# Patient Record
Sex: Male | Born: 1950 | Race: Black or African American | Hispanic: No | Marital: Single | Smoking: Current some day smoker
Health system: Southern US, Community
[De-identification: ages and names within clinical notes are randomized; demographics above are authoritative.]

## PROBLEM LIST (undated history)

## (undated) DIAGNOSIS — I10 Essential (primary) hypertension: Secondary | ICD-10-CM

## (undated) DIAGNOSIS — K219 Gastro-esophageal reflux disease without esophagitis: Secondary | ICD-10-CM

## (undated) DIAGNOSIS — F319 Bipolar disorder, unspecified: Secondary | ICD-10-CM

## (undated) DIAGNOSIS — I219 Acute myocardial infarction, unspecified: Secondary | ICD-10-CM

## (undated) HISTORY — DX: Acute myocardial infarction, unspecified: I21.9

## (undated) HISTORY — PX: LEG SURGERY: SHX1003

## (undated) HISTORY — PX: KNEE SURGERY: SHX244

---

## 2016-01-11 ENCOUNTER — Emergency Department
Admission: EM | Admit: 2016-01-11 | Discharge: 2016-01-11 | Disposition: A | Discharge status: Home / Self Care | Payer: Medicare Other | Attending: Emergency Medicine | Admitting: Emergency Medicine

## 2016-01-11 ENCOUNTER — Emergency Department: Payer: Medicare Other

## 2016-01-11 ENCOUNTER — Encounter: Payer: Self-pay | Admitting: Emergency Medicine

## 2016-01-11 DIAGNOSIS — W010XXA Fall on same level from slipping, tripping and stumbling without subsequent striking against object, initial encounter: Secondary | ICD-10-CM | POA: Insufficient documentation

## 2016-01-11 DIAGNOSIS — S8012XA Contusion of left lower leg, initial encounter: Secondary | ICD-10-CM | POA: Diagnosis not present

## 2016-01-11 DIAGNOSIS — Y999 Unspecified external cause status: Secondary | ICD-10-CM | POA: Diagnosis not present

## 2016-01-11 DIAGNOSIS — Y929 Unspecified place or not applicable: Secondary | ICD-10-CM | POA: Insufficient documentation

## 2016-01-11 DIAGNOSIS — Y939 Activity, unspecified: Secondary | ICD-10-CM | POA: Insufficient documentation

## 2016-01-11 DIAGNOSIS — S8992XA Unspecified injury of left lower leg, initial encounter: Secondary | ICD-10-CM | POA: Diagnosis present

## 2016-01-11 LAB — COMPREHENSIVE METABOLIC PANEL
ALBUMIN: 4.9 g/dL (ref 3.5–5.0)
ALK PHOS: 106 U/L (ref 38–126)
ALT: 14 U/L — ABNORMAL LOW (ref 17–63)
ANION GAP: 9 (ref 5–15)
AST: 21 U/L (ref 15–41)
BUN: 14 mg/dL (ref 6–20)
CALCIUM: 9.5 mg/dL (ref 8.9–10.3)
CHLORIDE: 104 mmol/L (ref 101–111)
CO2: 22 mmol/L (ref 22–32)
Creatinine, Ser: 0.93 mg/dL (ref 0.61–1.24)
GFR calc non Af Amer: >60 mL/min (ref >60)
GLUCOSE: 78 mg/dL (ref 65–99)
POTASSIUM: 4.3 mmol/L (ref 3.5–5.1)
Sodium: 135 mmol/L (ref 135–145)
Total Bilirubin: 1.2 mg/dL (ref 0.3–1.2)
Total Protein: 8.2 g/dL — ABNORMAL HIGH (ref 6.5–8.1)

## 2016-01-11 LAB — CBC WITH DIFFERENTIAL/PLATELET
BASOS PCT: 1 %
Basophils Absolute: 0 10*3/uL (ref 0–0.1)
Eosinophils Absolute: 0.1 10*3/uL (ref 0–0.7)
Eosinophils Relative: 2 %
HEMATOCRIT: 36.3 % — AB (ref 40.0–52.0)
HEMOGLOBIN: 12 g/dL — AB (ref 13.0–18.0)
LYMPHS PCT: 41 %
Lymphs Abs: 2 10*3/uL (ref 1.0–3.6)
MCH: 28.4 pg (ref 26.0–34.0)
MCHC: 33 g/dL (ref 32.0–36.0)
MCV: 86 fL (ref 80.0–100.0)
MONO ABS: 0.3 10*3/uL (ref 0.2–1.0)
MONOS PCT: 7 %
NEUTROS ABS: 2.4 10*3/uL (ref 1.4–6.5)
NEUTROS PCT: 49 %
Platelets: 249 10*3/uL (ref 150–440)
RBC: 4.22 MIL/uL — ABNORMAL LOW (ref 4.40–5.90)
RDW: 13.9 % (ref 11.5–14.5)
WBC: 4.8 10*3/uL (ref 3.8–10.6)

## 2016-01-11 MED ORDER — BACITRACIN-NEOMYCIN-POLYMYXIN 400-5-5000 EX OINT: 1.0000 "application " | TOPICAL_OINTMENT | Freq: Two times a day (BID) | CUTANEOUS | Status: AC

## 2016-01-11 NOTE — ED Notes (Signed)
Unable to p
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
## 2016-01-11 NOTE — ED Notes (Signed)
Patient transported to Ultrasound 

## 2016-01-11 NOTE — ED Provider Notes (Signed)
[facility] Emergency Department Provider Note  ____________________________________________  Time seen: Approximately 12:44 PM  I have reviewed the triage vital signs and the nursing notes.   HISTORY  Chief Complaint Leg Pain    HPI Jaime Zuniga is a 65 y.o. male who lives in a group home presents with complaints of left leg swelling and pain.  Patient states that he tripped and fell approximately 2 weeks ago injuring his left lower leg. Complains his leg is now swollen and spontaneously draining from the open sores. Patient reports fever and chills subjectively.Patient reports just completed a course of amoxicillin secondary to dental infections. Reports eating or drinking last evening at 6 PM last night which was approximately 18 hours ago.   No past medical history on file.  There are no active problems to display for this patient.   No past surgical history on file.  Current Outpatient Rx  Name  Route  Sig  Dispense  Refill  . neomycin-bacitracin-polymyxin (NEOSPORIN) ointment   Topical   Apply 1 application topically 2 (two) times daily.   15 g   0     Allergies Review of patient's allergies indicates no known allergies.  No family history on file.  Social History Social History  Substance Use Topics  . Smoking status: Never Smoker   . Smokeless tobacco: None  . Alcohol Use: None    Review of Systems Constitutional: No fever/chills Eyes: No visual changes. ENT: No sore throat. Cardiovascular: Denies chest pain. Respiratory: Denies shortness of breath. Musculoskeletal: Positive for left leg pain swelling and draining. Skin: Negative for rash. Neurological: Negative for headaches, focal weakness or numbness.  10-point ROS otherwise negative.  ____________________________________________   PHYSICAL EXAM:  VITAL SIGNS: ED Triage Vitals  Enc Vitals Group     BP 01/11/16 1134 122/85 mmHg     Pulse Rate 01/11/16 1134 65      Resp 01/11/16 1134 14     Temp 01/11/16 1134 98.5 F (36.9 C)     Temp Source 01/11/16 1134 Oral     SpO2 01/11/16 1134 99 %     Weight 01/11/16 1134 200 lb (90.719 kg)     Height 01/11/16 1134 6\' 2"  (1.88 m)     Head Cir --      Peak Flow --      Pain Score 01/11/16 1132 5     Pain Loc --      Pain Edu? --      Excl. in GC? --     Constitutional: Alert and oriented. Well appearing and in no acute distress. Mouth/Throat: Mucous membranes are moist.  Oropharynx non-erythematous. Neck: No stridor.   Cardiovascular: Normal rate, regular rhythm. Grossly normal heart sounds.  Good peripheral circulation. Respiratory: Normal respiratory effort.  No retractions. Lungs CTAB. Musculoskeletal: Lower extremity left leg. Edematous, ecchymotic with open lesions noted anteriorly. 2-3+ pitting edema noted. Neurologic:  Normal speech and language. No gross focal neurologic deficits are appreciated. No gait instability. Skin:  Skin is warm, dry and intact. No rash noted. Psychiatric: Mood and affect are normal. Speech and behavior are normal.  ____________________________________________   LABS (all labs ordered are listed, but only abnormal results are displayed)  Labs Reviewed  COMPREHENSIVE METABOLIC PANEL - Abnormal; Notable for the following:    Total Protein 8.2 (*)    ALT 14 (*)    All other components within normal limits  CBC WITH DIFFERENTIAL/PLATELET - Abnormal; Notable for the following:  RBC 4.22 (*)    Hemoglobin 12.0 (*)    HCT 36.3 (*)    All other components within normal limits  CULTURE, BLOOD (ROUTINE X 2)  CULTURE, BLOOD (ROUTINE X 2)  CBC WITH DIFFERENTIAL/PLATELET   ____________________________________________  EKG   ____________________________________________  RADIOLOGY  Ultrasound no acute findings. X-ray no acute ____________________________________________   PROCEDURES  Procedure(s) performed: None  Critical Care performed:  No  ____________________________________________   INITIAL IMPRESSION / ASSESSMENT AND PLAN / ED COURSE  Pertinent labs & imaging results that were available during my care of the patient were reviewed by me and considered in my medical decision making (see chart for details).  To left lower leg with secondary superficial infection. Concern for abscess to the left lower leg. Consult placed for surgeon on-call to evaluate.After surgical consultation patient was discharged home with Neosporin for wound care. Follow up with PCP or return to the ER with any worse ____________________________________________   FINAL CLINICAL IMPRESSION(S) / ED DIAGNOSES  Final diagnoses:  Contusion of leg, left, initial encounter     This chart was dictated using voice recognition software/Dragon. Despite best efforts to proofread, errors can occur which can change the meaning. Any change was purely unintentional.   Evangeline Dakin, PA-C 01/11/16 1726  Jeanmarie Plant, MD 01/12/16 2204

## 2016-01-11 NOTE — Discharge Instructions (Signed)

## 2016-01-11 NOTE — ED Notes (Signed)
Reports tripped and fell a couple of weeks ago, pain to left leg

## 2016-01-16 NOTE — Progress Notes (Cosign Needed)
Previous note written in error on wrong patient.

## 2016-01-16 NOTE — Progress Notes (Signed)
Labs never acknowledged or drawn after 1 hour.

## 2016-01-19 LAB — CULTURE, BLOOD (ROUTINE X 2): Culture: NO GROWTH

## 2016-02-08 ENCOUNTER — Encounter: Payer: Self-pay | Admitting: Emergency Medicine

## 2016-02-08 ENCOUNTER — Emergency Department
Admission: EM | Admit: 2016-02-08 | Discharge: 2016-02-08 | Disposition: A | Discharge status: Home / Self Care | Payer: Medicare Other | Attending: Emergency Medicine | Admitting: Emergency Medicine

## 2016-02-08 ENCOUNTER — Emergency Department: Payer: Medicare Other

## 2016-02-08 DIAGNOSIS — I10 Essential (primary) hypertension: Secondary | ICD-10-CM | POA: Insufficient documentation

## 2016-02-08 DIAGNOSIS — L97929 Non-pressure chronic ulcer of unspecified part of left lower leg with unspecified severity: Secondary | ICD-10-CM | POA: Diagnosis not present

## 2016-02-08 DIAGNOSIS — F319 Bipolar disorder, unspecified: Secondary | ICD-10-CM | POA: Insufficient documentation

## 2016-02-08 DIAGNOSIS — M79662 Pain in left lower leg: Secondary | ICD-10-CM | POA: Diagnosis present

## 2016-02-08 DIAGNOSIS — L98499 Non-pressure chronic ulcer of skin of other sites with unspecified severity: Secondary | ICD-10-CM

## 2016-02-08 HISTORY — DX: Bipolar disorder, unspecified: F31.9

## 2016-02-08 HISTORY — DX: Essential (primary) hypertension: I10

## 2016-02-08 HISTORY — DX: Gastro-esophageal reflux disease without esophagitis: K21.9

## 2016-02-08 NOTE — Discharge Instructions (Signed)
Skin Ulcer  A skin ulcer is an open sore that can be shallow or deep. Skin ulcers sometimes become infected and are difficult to treat. It may be 1 month or longer before real healing progress is made.  CAUSES   · Injury.  · Problems with the veins or arteries.  · Diabetes.  · Insect bites.  · Bedsores.  · Inflammatory conditions.  SYMPTOMS   · Pain, redness, swelling, and tenderness around the ulcer.  · Fever.  · Bleeding from the ulcer.  · Yellow or clear fluid coming from the ulcer.  DIAGNOSIS   There are many types of skin ulcers. Any open sores will be examined. Certain tests will be done to determine the kind of ulcer you have. The right treatment depends on the type of ulcer you have.  TREATMENT   Treatment is a long-term challenge. It may include:  · Wearing an elastic wrap, compression stockings, or gel cast over the ulcer area.  · Taking antibiotic medicines or putting antibiotic creams on the affected area if there is an infection.  HOME CARE INSTRUCTIONS  · Put on your bandages (dressings), wraps, or casts over the ulcer as directed by your caregiver.  · Change all dressings as directed by your caregiver.  · Take all medicines as directed by your caregiver.  · Keep the affected area clean and dry.  · Avoid injuries to the affected area.  · Eat a well-balanced, healthy diet that includes plenty of fruit and vegetables.  · If you smoke, consider quitting or decreasing the amount of cigarettes you smoke.  · Once the ulcer heals, get regular exercise as directed by your caregiver.  · Work with your caregiver to make sure your blood pressure, cholesterol, and diabetes are well-controlled.  · Keep your skin moisturized. Dry skin can crack and lead to skin ulcers.  SEEK IMMEDIATE MEDICAL CARE IF:   · Your pain gets worse.  · You have swelling, redness, or fluids around the ulcer.  · You have chills.  · You have a fever.  MAKE SURE YOU:   · Understand these instructions.  · Will watch your condition.  · Will get  help right away if you are not doing well or get worse.     This information is not intended to replace advice given to you by your health care provider. Make sure you discuss any questions you have with your health care provider.     Document Released: 08/31/2004 Document Revised: 10/16/2011 Document Reviewed: 03/10/2011  Elsevier Interactive Patient Education ©2016 [employer]

## 2016-02-08 NOTE — ED Notes (Signed)
Pt alert and oriented X4, active, cooperative, pt in NAD. RR even and unlabored, color WNL.  Pt informed to return if any life threatening symptoms occur.  Pt left with caregiver from facility who signed e signature for followup and has discharge papers.

## 2016-02-08 NOTE — ED Notes (Signed)
X-ray at bedside

## 2016-02-08 NOTE — ED Notes (Signed)
[facility] does not carry [employer] boot anymore per staff. Brought up "econo-paste" bandage for wound. Dr. Mayford KnifeWilliams informed and gives verbal orders to apple to mid shin area of left leg to cover wound. Wille CelesteJanie, RN at bedside to assist. Pt CMS intact after application, able to move toes, color WNL. Ace bandage placed on top per MD order

## 2016-02-08 NOTE — ED Notes (Signed)
[facility] called for [employer]na boot.

## 2016-02-08 NOTE — ED Notes (Signed)
MD at bedside. 
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
## 2016-02-08 NOTE — ED Notes (Signed)
Pt with wound to left anterior shin.  Pt states home health nurse checked his wound last night and dressed it, but now has increased drainage/swelling

## 2016-02-11 ENCOUNTER — Encounter: Payer: Medicare Other | Attending: Surgery | Admitting: Surgery

## 2016-02-11 ENCOUNTER — Other Ambulatory Visit: Payer: Self-pay | Admitting: Surgery

## 2016-02-11 DIAGNOSIS — F0390 Unspecified dementia without behavioral disturbance: Secondary | ICD-10-CM | POA: Insufficient documentation

## 2016-02-11 DIAGNOSIS — I252 Old myocardial infarction: Secondary | ICD-10-CM | POA: Insufficient documentation

## 2016-02-11 DIAGNOSIS — I10 Essential (primary) hypertension: Secondary | ICD-10-CM | POA: Insufficient documentation

## 2016-02-11 DIAGNOSIS — K219 Gastro-esophageal reflux disease without esophagitis: Secondary | ICD-10-CM | POA: Diagnosis not present

## 2016-02-11 DIAGNOSIS — R32 Unspecified urinary incontinence: Secondary | ICD-10-CM | POA: Diagnosis not present

## 2016-02-11 DIAGNOSIS — M61462 Other calcification of muscle, left lower leg: Secondary | ICD-10-CM | POA: Diagnosis not present

## 2016-02-11 DIAGNOSIS — L97222 Non-pressure chronic ulcer of left calf with fat layer exposed: Secondary | ICD-10-CM | POA: Insufficient documentation

## 2016-02-11 DIAGNOSIS — L97909 Non-pressure chronic ulcer of unspecified part of unspecified lower leg with unspecified severity: Secondary | ICD-10-CM

## 2016-02-11 DIAGNOSIS — M1990 Unspecified osteoarthritis, unspecified site: Secondary | ICD-10-CM | POA: Diagnosis not present

## 2016-02-11 DIAGNOSIS — I739 Peripheral vascular disease, unspecified: Secondary | ICD-10-CM | POA: Diagnosis not present

## 2016-02-11 DIAGNOSIS — Z87891 Personal history of nicotine dependence: Secondary | ICD-10-CM | POA: Insufficient documentation

## 2016-02-11 DIAGNOSIS — Z79899 Other long term (current) drug therapy: Secondary | ICD-10-CM | POA: Insufficient documentation

## 2016-02-11 DIAGNOSIS — G20 Parkinson's disease: Secondary | ICD-10-CM | POA: Insufficient documentation

## 2016-02-11 DIAGNOSIS — J449 Chronic obstructive pulmonary disease, unspecified: Secondary | ICD-10-CM | POA: Insufficient documentation

## 2016-02-12 NOTE — Progress Notes (Signed)
HERNANDO, REALI ([id number]) Visit Report for 02/11/2016 Allergy List Details Patient Name: Jaime Zuniga, Jaime Zuniga Date of Service: 02/11/2016 8:45 AM Medical Record Number: [id number] Patient Account Number: [id number] Date of Birth/Sex: 1951/08/05 (65 y.o. Male) Treating RN: Phillis Haggis Primary Care Physician: Evelene Croon Other Clinician: Referring Physician: Daryel November Treating Physician/Extender: Rudene Re in Treatment: 0 Allergies Active Allergies NKDA Allergy Notes Electronic Signature(s) Signed: 02/11/2016 4:27:53 PM By: Alejandro Mulling Entered By: Alejandro Mulling on 02/11/2016 08:53:15 Jaime Zuniga ([id number]) -------------------------------------------------------------------------------- Arrival Information Details Patient Name: Jaime Zuniga Date of Service: 02/11/2016 8:45 AM Medical Record Number: [id number] Patient Account Number: [id number] Date of Birth/Sex: February 16, 1951 (65 y.o. Male) Treating RN: Phillis Haggis Primary Care Physician: Evelene Croon Other Clinician: Referring Physician: Daryel November Treating Physician/Extender: Rudene Re in Treatment: 0 Visit Information Patient Arrived: Jaime Zuniga Time: 08:49 Accompanied By: caregiver Transfer Assistance: EasyPivot Patient Lift Patient Identification Verified: Yes Secondary Verification Process Yes Completed: Patient Requires Transmission- No Based Precautions: Patient Has Alerts: No Electronic Signature(s) Signed: 02/11/2016 4:27:53 PM By: Alejandro Mulling Entered By: Alejandro Mulling on 02/11/2016 08:50:47 Jaime Zuniga ([id number]) -------------------------------------------------------------------------------- Clinic Level of Care Assessment Details Patient Name: Jaime Zuniga Date of Service: 02/11/2016 8:45 AM Medical Record Number: [id number] Patient Account Number: [id number] Date of Birth/Sex: 1951-04-30 (65 y.o. Male) Treating RN: Phillis Haggis Primary Care  Physician: Evelene Croon Other Clinician: Referring Physician: Daryel November Treating Physician/Extender: Rudene Re in Treatment: 0 Clinic Level of Care Assessment Items TOOL 2 Quantity Score X - Use when only an EandM is performed on the INITIAL visit 1 0 ASSESSMENTS - Nursing Assessment / Reassessment X - General Physical Exam (combine w/ comprehensive assessment (listed just 1 20 below) when performed on new pt. evals) X - Comprehensive Assessment (HX, ROS, Risk Assessments, Wounds Hx, etc.) 1 25 ASSESSMENTS - Wound and Skin Assessment / Reassessment []  - Simple Wound Assessment / Reassessment - one wound 0 X - Complex Wound Assessment / Reassessment - multiple wounds 2 5 []  - Dermatologic / Skin Assessment (not related to wound area) 0 ASSESSMENTS - Ostomy and/or Continence Assessment and Care []  - Incontinence Assessment and Management 0 []  - Ostomy Care Assessment and Management (repouching, etc.) 0 PROCESS - Coordination of Care []  - Simple Patient / Family Education for ongoing care 0 X - Complex (extensive) Patient / Family Education for ongoing care 1 20 X - Staff obtains Chiropractor, Records, Test Results / Process Orders 1 10 X - Staff telephones HHA, Nursing Homes / Clarify orders / etc 1 10 []  - Routine Transfer to another Facility (non-emergent condition) 0 []  - Routine Hospital Admission (non-emergent condition) 0 X - New Admissions / Manufacturing engineer / Ordering NPWT, Apligraf, etc. 1 15 []  - Emergency Hospital Admission (emergent condition) 0 X - Simple Discharge Coordination 1 10 Jaime Zuniga, Jaime Zuniga ([id number]) []  - Complex (extensive) Discharge Coordination 0 PROCESS - Special Needs []  - Pediatric / Minor Patient Management 0 []  - Isolation Patient Management 0 []  - Hearing / Language / Visual special needs 0 []  - Assessment of Community assistance (transportation, D/C planning, etc.) 0 []  - Additional assistance / Altered mentation 0 []  -  Support Surface(s) Assessment (bed, cushion, seat, etc.) 0 INTERVENTIONS - Wound Cleansing / Measurement X - Wound Imaging (photographs - any number of wounds) 1 5 []  - Wound Tracing (instead of photographs) 0 []  - Simple Wound Measurement - one wound 0 X - Complex Wound Measurement - multiple wounds 2 5 []  - Simple Wound Cleansing - one  wound 0 X - Complex Wound Cleansing - multiple wounds 2 5 INTERVENTIONS - Wound Dressings  - Small Wound Dressing one or multiple wounds 0 X - Medium Wound Dressing one or multiple wounds 2 15  - Large Wound Dressing one or multiple wounds 0  - Application of Medications - injection 0 INTERVENTIONS - Miscellaneous  - External ear exam 0  - Specimen Collection (cultures, biopsies, blood, body fluids, etc.) 0  - Specimen(s) / Culture(s) sent or taken to Lab for analysis 0  - Patient Transfer (multiple staff / Michiel Sites Lift / Similar devices) 0  - Simple Staple / Suture removal (25 or less) 0  - Complex Staple / Suture removal (26 or more) 0 Jaime Zuniga, Jaime Zuniga ([id number])  - Hypo / Hyperglycemic Management (close monitor of Blood Glucose) 0 X - Ankle / Brachial Index (ABI) - do not check if billed separately 1 15 Has the patient been seen at the hospital within the last three years: Yes Total Score: 190 Level Of Care: New/Established - Level 5 Electronic Signature(s) Signed: 02/11/2016 4:27:53 PM By: Alejandro Mulling Entered By: Alejandro Mulling on 02/11/2016 10:30:19 Jaime Zuniga ([id number]) -------------------------------------------------------------------------------- Encounter Discharge Information Details Patient Name: Jaime Zuniga Date of Service: 02/11/2016 8:45 AM Medical Record Number: [id number] Patient Account Number: [id number] Date of Birth/Sex: 10-Aug-1950 (65 y.o. Male) Treating RN: Phillis Haggis Primary Care Physician: Evelene Croon Other Clinician: Referring Physician: Daryel November Treating  Physician/Extender: Rudene Re in Treatment: 0 Encounter Discharge Information Items Discharge Pain Level: 0 Discharge Condition: Stable Ambulatory Status: Walker Nursing Discharge Destination: Home Transportation: Other Accompanied By: caregiver Schedule Follow-up Appointment: Yes Medication Reconciliation completed No and provided to Patient/Care Ovid Witman: Provided on Clinical Summary of Care: 02/11/2016 Form Type Recipient Paper Patient SB Electronic Signature(s) Signed: 02/11/2016 10:01:57 AM By: Gwenlyn Perking Entered By: Gwenlyn Perking on 02/11/2016 10:01:57 Jaime Zuniga ([id number]) -------------------------------------------------------------------------------- Lower Extremity Assessment Details Patient Name: Jaime Zuniga Date of Service: 02/11/2016 8:45 AM Medical Record Number: [id number] Patient Account Number: [id number] Date of Birth/Sex: 11/20/1950 (65 y.o. Male) Treating RN: Phillis Haggis Primary Care Physician: Evelene Croon Other Clinician: Referring Physician: Daryel November Treating Physician/Extender: Rudene Re in Treatment: 0 Edema Assessment Assessed: [Left: No] [Right: No] E[Left: dema] [Right: :] Calf Left: Right: Point of Measurement: 32 cm From Medial Instep 39 cm 35.2 cm Ankle Left: Right: Point of Measurement: 12 cm From Medial Instep 29.5 cm 24.5 cm Vascular Assessment Pulses: Popliteal Doppler: [Left:Monophasic] Posterior Tibial Palpable: [Left:No] [Right:No] Doppler: [Left:Monophasic] [Right:Monophasic] Dorsalis Pedis Palpable: [Left:No] [Right:No] Doppler: [Left:Monophasic] [Right:Monophasic] Extremity colors, hair growth, and conditions: Extremity Color: [Left:Hyperpigmented] [Right:Hyperpigmented] Hair Growth on Extremity: [Left:No] [Right:No] Temperature of Extremity: [Left:Cool] [Right:Cool] Capillary Refill: [Left:< 3 seconds] [Right:< 3 seconds] Blood Pressure: Brachial: [Left:104]  [Right:104] Dorsalis Pedis: 70 [Left:Dorsalis Pedis: 50] Ankle: Posterior Tibial: [Left:Posterior Tibial: 0.67] [Right:0.48] Toe Nail Assessment Left: Right: Thick: Yes Yes Discolored: No No Nill, Justn ([id number]) Deformed: No No Improper Length and Hygiene: No No Electronic Signature(s) Signed: 02/11/2016 4:27:53 PM By: Alejandro Mulling Entered By: Alejandro Mulling on 02/11/2016 09:22:00 Jaime Zuniga ([id number]) -------------------------------------------------------------------------------- Multi Wound Chart Details Patient Name: Jaime Zuniga Date of Service: 02/11/2016 8:45 AM Medical Record Number: [id number] Patient Account Number: [id number] Date of Birth/Sex: 11-03-1950 (66 y.o. Male) Treating RN: Phillis Haggis Primary Care Physician: Evelene Croon Other Clinician: Referring Physician: Daryel November Treating Physician/Extender: Rudene Re in Treatment: 0 Vital Signs Height(in): 72 Pulse(bpm): 72 Weight(lbs): 210 Blood Pressure 104/57 (mmHg): Body Mass Index(BMI): 28 Temperature(F): 97.7 Respiratory Rate 16 (breaths/min):  Photos: [1:No Photos] [2:No Photos] [N/A:N/A] Wound Location: [1:Left Lower Leg - Anterior, Proximal] [2:Left Lower Leg - Anterior, Distal] [N/A:N/A] Wounding Event: [1:Gradually Appeared] [2:Gradually Appeared] [N/A:N/A] Primary Etiology: [1:Venous Leg Ulcer] [2:Venous Leg Ulcer] [N/A:N/A] Comorbid History: [1:Chronic Obstructive Pulmonary Disease (COPD), Hypertension, Myocardial Infarction, Osteoarthritis, Dementia] [2:Chronic Obstructive Pulmonary Disease (COPD), Hypertension, Myocardial Infarction, Osteoarthritis, Dementia] [N/A:N/A] Date Acquired: [1:01/26/2016] [2:01/26/2016] [N/A:N/A] Weeks of Treatment: [1:0] [2:0] [N/A:N/A] Wound Status: [1:Open] [2:Open] [N/A:N/A] Measurements L x W x D 2.2x0.7x0.3 [2:1.5x1x0.2] [N/A:N/A] (cm) Area (cm) : [1:1.21] [2:1.178] [N/A:N/A] Volume (cm) : [1:0.363] [2:0.236]  [N/A:N/A] Starting Position 1 9 (o'clock): Ending Position 1 [1:5] (o'clock): Maximum Distance 1 0.3 (cm): Undermining: [1:Yes] [2:N/A] [N/A:N/A] Classification: [1:Full Thickness Without Exposed Support Structures] [2:Full Thickness Without Exposed Support Structures] [N/A:N/A] Exudate Amount: [1:Large] [2:Large] [N/A:N/A] Exudate Type: [1:Serosanguineous] [2:Serosanguineous] [N/A:N/A] Exudate Color: [1:red, Jaime Zuniga] [2:red, Jaime Zuniga] [N/A:N/A] Wound Margin: Distinct, outline attached Distinct, outline attached N/A Granulation Amount: Medium (34-66%) Small (1-33%) N/A Granulation Quality: Red Red N/A Necrotic Amount: Medium (34-66%) Large (67-100%) N/A Exposed Structures: Fat: Yes Fascia: No N/A Fascia: No Fat: No Tendon: No Tendon: No Muscle: No Muscle: No Joint: No Joint: No Bone: No Bone: No Epithelialization: None None N/A Periwound Skin Texture: Edema: Yes No Abnormalities Noted N/A Periwound Skin Moist: Yes No Abnormalities Noted N/A Moisture: Maceration: No Dry/Scaly: No Periwound Skin Color: Erythema: Yes Erythema: Yes N/A Erythema Location: Circumferential Circumferential N/A Temperature: Cool/Cold Cool/Cold N/A Tenderness on Yes Yes N/A Palpation: Wound Preparation: Ulcer Cleansing: Ulcer Cleansing: N/A Rinsed/Irrigated with Rinsed/Irrigated with Saline Saline Topical Anesthetic Topical Anesthetic Applied: Other: lidocaine Applied: Other: Lidocaine 4% cream 4% Cream Treatment Notes Electronic Signature(s) Signed: 02/11/2016 4:27:53 PM By: Alejandro MullingPinkerton, Jaime Zuniga Entered By: Alejandro MullingPinkerton, Jaime Zuniga on 02/11/2016 09:42:05 Jaime MaclachlanBURTON, Jaime Zuniga ([id number]) -------------------------------------------------------------------------------- Multi-Disciplinary Care Plan Details Patient Name: Jaime MaclachlanBURTON, Jaime Zuniga Date of Service: 02/11/2016 8:45 AM Medical Record Number: [id number] Patient Account Number: [id number] Date of Birth/Sex: 05-Jan-1951 [age] y.o. Male) Treating RN: Phillis HaggisPinkerton,  Jaime Zuniga Primary Care Physician: Evelene CroonNiemeyer, Meindert Other Clinician: Referring Physician: Daryel NovemberWILLIAMS, JONATHAN Treating Physician/Extender: Rudene ReBritto, Errol Weeks in Treatment: 0 Active Inactive Abuse / Safety / Falls / Self Care Management Nursing Diagnoses: Potential for falls Goals: Patient will remain injury free Date Initiated: 02/11/2016 Goal Status: Active Interventions: Assess fall risk on admission and as needed Assess self care needs on admission and as needed Notes: Nutrition Nursing Diagnoses: Imbalanced nutrition Goals: Patient/caregiver agrees to and verbalizes understanding of need to use nutritional supplements and/or vitamins as prescribed Date Initiated: 02/11/2016 Goal Status: Active Interventions: Assess patient nutrition upon admission and as needed per policy Notes: Orientation to the Wound Care Program Nursing Diagnoses: Knowledge deficit related to the wound healing center program GoalsNada Zuniga: Jaime Zuniga, Jaime Zuniga ([id number]) Patient/caregiver will verbalize understanding of the Wound Healing Center Program Date Initiated: 02/11/2016 Goal Status: Active Interventions: Provide education on orientation to the wound center Notes: Pain, Acute or Chronic Nursing Diagnoses: Pain, acute or chronic: actual or potential Potential alteration in comfort, pain Goals: Patient will verbalize adequate pain control and receive pain control interventions during procedures as needed Date Initiated: 02/11/2016 Goal Status: Active Patient/caregiver will verbalize adequate pain control between visits Date Initiated: 02/11/2016 Goal Status: Active Interventions: Assess comfort goal upon admission Complete pain assessment as per visit requirements Notes: Soft Tissue Infection Nursing Diagnoses: Impaired tissue integrity Goals: Patient/caregiver will verbalize understanding of or measures to prevent infection and contamination in the home setting Date Initiated: 02/11/2016 Goal Status:  Active Patient's soft tissue infection will resolve Date Initiated: 02/11/2016 Goal Status:  Active Interventions: Assess signs and symptoms of infection every visit Jaime Zuniga, Jaime Zuniga ([id number]) Notes: Wound/Skin Impairment Nursing Diagnoses: Impaired tissue integrity Knowledge deficit related to smoking impact on wound healing Goals: Patient will demonstrate a reduced rate of smoking or cessation of smoking Date Initiated: 02/11/2016 Goal Status: Active Ulcer/skin breakdown will have a volume reduction of 30% by week 4 Date Initiated: 02/11/2016 Goal Status: Active Ulcer/skin breakdown will have a volume reduction of 50% by week 8 Date Initiated: 02/11/2016 Goal Status: Active Ulcer/skin breakdown will have a volume reduction of 80% by week 12 Date Initiated: 02/11/2016 Goal Status: Active Interventions: Assess ulceration(s) every visit Notes: Electronic Signature(s) Signed: 02/11/2016 4:27:53 PM By: Alejandro Mulling Entered By: Alejandro Mulling on 02/11/2016 09:41:50 Jaime Zuniga ([id number]) -------------------------------------------------------------------------------- Pain Assessment Details Patient Name: Jaime Zuniga Date of Service: 02/11/2016 8:45 AM Medical Record Number: [id number] Patient Account Number: [id number] Date of Birth/Sex: 16-Oct-1950 (65 y.o. Male) Treating RN: Phillis Haggis Primary Care Physician: Evelene Croon Other Clinician: Referring Physician: Daryel November Treating Physician/Extender: Rudene Re in Treatment: 0 Active Problems Location of Pain Severity and Description of Pain Patient Has Paino No Site Locations With Dressing Change: No Pain Management and Medication Current Pain Management: Electronic Signature(s) Signed: 02/11/2016 4:27:53 PM By: Alejandro Mulling Entered By: Alejandro Mulling on 02/11/2016 08:50:59 Jaime Zuniga  ([id number]) -------------------------------------------------------------------------------- Patient/Caregiver Education Details Patient Name: Jaime Zuniga Date of Service: 02/11/2016 8:45 AM Medical Record Number: [id number] Patient Account Number: [id number] Date of Birth/Gender: August 07, 1951 (65 y.o. Male) Treating RN: Phillis Haggis Primary Care Physician: Evelene Croon Other Clinician: Referring Physician: Daryel November Treating Physician/Extender: Rudene Re in Treatment: 0 Education Assessment Education Provided To: Patient Education Topics Provided Welcome To The Wound Care Center: Handouts: Welcome To The [facility] Methods: Explain/Verbal Wound/Skin Impairment: Handouts: Other: change dressing as ordered Methods: Demonstration, Explain/Verbal Responses: State content correctly Electronic Signature(s) Signed: 02/11/2016 4:27:53 PM By: Alejandro Mulling Entered By: Alejandro Mulling on 02/11/2016 09:59:51 Jaime Zuniga, Jaime Zuniga ([id number]) -------------------------------------------------------------------------------- Wound Assessment Details Patient Name: Jaime Zuniga Date of Service: 02/11/2016 8:45 AM Medical Record Number: [id number] Patient Account Number: [id number] Date of Birth/Sex: 09-Sep-1950 (65 y.o. Male) Treating RN: Phillis Haggis Primary Care Physician: Evelene Croon Other Clinician: Referring Physician: Daryel November Treating Physician/Extender: Rudene Re in Treatment: 0 Wound Status Wound Number: 1 Primary Venous Leg Ulcer Etiology: Wound Location: Left Lower Leg - Anterior, Proximal Wound Open Status: Wounding Event: Gradually Appeared Comorbid Chronic Obstructive Pulmonary Disease Date Acquired: 01/26/2016 History: (COPD), Hypertension, Myocardial Weeks Of Treatment: 0 Infarction, Osteoarthritis, Dementia Clustered Wound: No Photos Photo Uploaded By: Alejandro Mulling on 02/11/2016 11:50:18 Wound  Measurements Length: (cm) 2.2 Width: (cm) 0.7 Depth: (cm) 0.3 Area: (cm) 1.21 Volume: (cm) 0.363 % Reduction in Area: % Reduction in Volume: Epithelialization: None Tunneling: No Undermining: Yes Starting Position (o'clock): 9 Ending Position (o'clock): 5 Maximum Distance: (cm) 0.3 Wound Description Full Thickness Without Exposed Classification: Support Structures Wound Margin: Distinct, outline attached Exudate Large Amount: Exudate Type: Serosanguineous Jaime Zuniga, Jaime Zuniga ([id number]) Foul Odor After Cleansing: No Exudate Color: red, Jaime Zuniga Wound Bed Granulation Amount: Medium (34-66%) Exposed Structure Granulation Quality: Red Fascia Exposed: No Necrotic Amount: Medium (34-66%) Fat Layer Exposed: Yes Necrotic Quality: Adherent Slough Tendon Exposed: No Muscle Exposed: No Joint Exposed: No Bone Exposed: No Periwound Skin Texture Texture Color No Abnormalities Noted: No No Abnormalities Noted: No Localized Edema: Yes Erythema: Yes Erythema Location: Circumferential Moisture No Abnormalities Noted: No Temperature / Pain Dry / Scaly: No Temperature: Cool/Cold Maceration: No Tenderness on Palpation: Yes  Moist: Yes Wound Preparation Ulcer Cleansing: Rinsed/Irrigated with Saline Topical Anesthetic Applied: Other: lidocaine 4% cream, Treatment Notes Wound #1 (Left, Proximal, Anterior Lower Leg) 1. Cleansed with: Clean wound with Normal Saline 2. Anesthetic Topical Lidocaine 4% cream to wound bed prior to debridement 3. Peri-wound Care: Barrier cream 4. Dressing Applied: Santyl Ointment 5. Secondary Dressing Applied ABD Pad Dry Gauze Kerlix/Conform 7. Secured with Tape Notes netting Electronic Signature(s) Signed: 02/11/2016 4:27:53 PM By: Jaime Zuniga, Jaime Zuniga ([id number]) Entered By: Alejandro Mulling on 02/11/2016 82:95:62 ZHYQMV, HQION ([id number]) -------------------------------------------------------------------------------- Wound  Assessment Details Patient Name: Jaime Zuniga Date of Service: 02/11/2016 8:45 AM Medical Record Number: [id number] Patient Account Number: [id number] Date of Birth/Sex: February 06, 1951 (65 y.o. Male) Treating RN: Phillis Haggis Primary Care Physician: Evelene Croon Other Clinician: Referring Physician: Daryel November Treating Physician/Extender: Rudene Re in Treatment: 0 Wound Status Wound Number: 2 Primary Venous Leg Ulcer Etiology: Wound Location: Left Lower Leg - Anterior, Distal Wound Open Status: Wounding Event: Gradually Appeared Comorbid Chronic Obstructive Pulmonary Disease Date Acquired: 01/26/2016 History: (COPD), Hypertension, Myocardial Weeks Of Treatment: 0 Infarction, Osteoarthritis, Dementia Clustered Wound: No Photos Photo Uploaded By: Alejandro Mulling on 02/11/2016 11:50:18 Wound Measurements Length: (cm) 1.5 Width: (cm) 1 Depth: (cm) 0.2 Area: (cm) 1.178 Volume: (cm) 0.236 % Reduction in Area: % Reduction in Volume: Epithelialization: None Tunneling: No Wound Description Full Thickness Without Exposed Classification: Support Structures Wound Margin: Distinct, outline attached Exudate Large Amount: Exudate Type: Serosanguineous Exudate Color: red, Jaime Zuniga Foul Odor After Cleansing: No Wound Bed Granulation Amount: Small (1-33%) Exposed Structure Granulation Quality: Red Fascia Exposed: No Venhuizen, Oskar ([id number]) Necrotic Amount: Large (67-100%) Fat Layer Exposed: No Necrotic Quality: Adherent Slough Tendon Exposed: No Muscle Exposed: No Joint Exposed: No Bone Exposed: No Periwound Skin Texture Texture Color No Abnormalities Noted: No No Abnormalities Noted: No Erythema: Yes Moisture Erythema Location: Circumferential No Abnormalities Noted: No Temperature / Pain Temperature: Cool/Cold Tenderness on Palpation: Yes Wound Preparation Ulcer Cleansing: Rinsed/Irrigated with Saline Topical Anesthetic Applied: Other:  Lidocaine 4% Cream, Treatment Notes Wound #2 (Left, Distal, Anterior Lower Leg) 1. Cleansed with: Clean wound with Normal Saline 2. Anesthetic Topical Lidocaine 4% cream to wound bed prior to debridement 3. Peri-wound Care: Barrier cream 4. Dressing Applied: Santyl Ointment 5. Secondary Dressing Applied ABD Pad Dry Gauze Kerlix/Conform 7. Secured with Tape Notes netting Electronic Signature(s) Signed: 02/11/2016 4:27:53 PM By: Alejandro Mulling Entered By: Alejandro Mulling on 02/11/2016 09:29:48 Jaime Zuniga ([id number]) -------------------------------------------------------------------------------- Vitals Details Patient Name: Jaime Zuniga Date of Service: 02/11/2016 8:45 AM Medical Record Number: [id number] Patient Account Number: [id number] Date of Birth/Sex: [birth year] (65 y.o. Male) Treating RN: Phillis Haggis Primary Care Physician: Evelene Croon Other Clinician: Referring Physician: Daryel November Treating Physician/Extender: Rudene Re in Treatment: 0 Vital Signs Time Taken: 08:51 Temperature (F): 97.7 Height (in): 72 Pulse (bpm): 72 Source: Stated Respiratory Rate (breaths/min): 16 Weight (lbs): 210 Blood Pressure (mmHg): 104/57 Source: Stated Reference Range: 80 - 120 mg / dl Body Mass Index (BMI): 28.5 Electronic Signature(s) Signed: 02/11/2016 4:27:53 PM By: Alejandro Mulling Entered By: Alejandro Mulling on 02/11/2016 08:52:30

## 2016-02-12 NOTE — Progress Notes (Signed)
JANI, PLOEGER ([id number]) Visit Report for 02/11/2016 Chief Complaint Document Details Patient Name: Jaime Zuniga, Jaime Zuniga Date of Service: 02/11/2016 8:45 AM Medical Record Number: [id number] Patient Account Number: [id number] Date of Birth/Sex: [birth year] (65 y.o. 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 Male) Treating RN: Phillis Haggis Primary Care Physician: Evelene Croon Other Clinician: Referring Physician: Daryel November Treating Physician/Extender: Rudene Re in Treatment: 0 History of Present Illness Location: left lower extremity anterior shin area Quality: Patient reports experiencing a dull pain to affected area(s). Severity: Patient states wound are getting worse. Duration: Patient has had the wound for > 3 months prior to seeking treatment at the wound center Timing: Pain in wound is Intermittent (comes and goes Context: The wound appeared gradually over time Modifying Factors: Other treatment(s) tried include:Augmentin and doxycycline Associated Signs and Symptoms: Patient reports having increase swelling. HPI Description: 65 year old gentleman was being seen in the ER recently for  wounds on his left lower extremity which have been draining and there is swelling and he was concerned about wound infection. in the ER x-ray of the left leg was done which showed postsurgical changes but no acute findings.he wrapped his leg in and Unna's boots and referred him to the wound center. earlier in June he had a DVT study done of the left lower extremity which showed no evidence of DVT but there was edema seen. no reflux was seen on that study. in June he was seen by a surgeon Dr. Lemar Livings who advised symptomatic treatment. the patient was also seen at the [facility] he has recently completed courses of Augmentin and doxycycline. 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 Electronic Signature(s) Signed: 02/11/2016 9:52:20 AM By: Evlyn Kanner MD, FACS Previous Signature: 02/11/2016 9:26:46 AM Version By: Evlyn Kanner MD, FACS Entered By: Evlyn Kanner on 02/11/2016 09:52:19 JASSIAH, VIVIANO ([id number]) -------------------------------------------------------------------------------- Physical Exam Details Patient Name: Jaime Zuniga Date of Service: 02/11/2016 8:45 AM Medical Record Number: [id number] Patient Account Number: [id number] Date of Birth/Sex: October 13, 1950 (65 y.o. Male) Treating RN: Phillis Haggis Primary Care Physician: Evelene Croon Other Clinician: Referring Physician: Daryel November Treating Physician/Extender: Rudene Re in Treatment: 0 Constitutional . Pulse regular. Respirations normal and unlabored. Afebrile. . Eyes Nonicteric. Reactive to light. Ears, Nose, Mouth, and Throat Lips, teeth, and gums WNL.Marland Kitchen Moist mucosa without lesions. Neck supple and nontender. No palpable supraclavicular or cervical adenopathy. Normal sized without goiter. Respiratory WNL. No retractions.. Cardiovascular Pedal Pulses WNL. ABI on the left was  0.67 in the right was 0.48. No clubbing, cyanosis or edema. Chest Breasts symmetical and no nipple discharge.. Breast tissue WNL, no masses, lumps, or tenderness.. Gastrointestinal (GI) Abdomen without masses or tenderness.. No liver or spleen enlargement or tenderness.. Lymphatic No adneopathy. No adenopathy. No adenopathy. Musculoskeletal Adexa without tenderness or enlargement.. Digits and nails  w/o clubbing, cyanosis, infection, petechiae, ischemia, or inflammatory conditions.. Integumentary (Hair, Skin) No suspicious lesions. No crepitus or fluctuance. No peri-wound warmth or erythema. No masses.Marland Kitchen Psychiatric Judgement and insight Intact.. No evidence of depression, anxiety, or agitation.. Notes he has significant amount of subcutaneous and muscular calcification possibly heterotrophic calcification in his left lower extremity from previous injury and surgery. The ulcers are superficial but the granulation tissues directly on bone. No debridement was done today. Electronic Signature(s) Signed: 02/11/2016 9:53:19 AM By: Evlyn Kanner MD, FACS Entered By: Evlyn Kanner on 02/11/2016 09:53:19 ROBBEN, JAGIELLO ([id number]) KAYLOR, SIMENSON ([id number]) -------------------------------------------------------------------------------- Physician Orders Details Patient Name: Jaime Zuniga Date of Service: 02/11/2016 8:45 AM Medical Record Number: [id number] Patient Account Number: [id number] Date of Birth/Sex: [birth year] (65 y.o. Male) Treating RN: Phillis Haggis Primary Care Physician: Evelene Croon Other Clinician: Referring Physician: Daryel November Treating Physician/Extender: Rudene Re in Treatment: 0 Verbal / Phone Orders: Yes ClinicianAshok Cordia, Debi Read Back and Verified: Yes Diagnosis Coding Wound Cleansing Wound #1 Left,Proximal,Anterior Lower Leg o Clean wound with Normal Saline. o Cleanse wound with mild soap and water o May Shower, gently pat wound  dry prior to applying new dressing. Wound #2 Left,Distal,Anterior Lower Leg o Clean wound with Normal Saline. o Cleanse wound with mild soap and water o May Shower, gently pat wound dry prior to applying new dressing. Anesthetic Wound #1 Left,Proximal,Anterior Lower Leg o Topical Lidocaine 4% cream applied to wound bed prior to debridement - for clinic use Wound #2 Left,Distal,Anterior Lower Leg o Topical Lidocaine 4% cream applied to wound bed prior to debridement - for clinic use Skin Barriers/Peri-Wound Care Wound #1 Left,Proximal,Anterior Lower Leg o Barrier cream Wound #2 Left,Distal,Anterior Lower Leg o Barrier cream Primary Wound Dressing Wound #1 Left,Proximal,Anterior Lower Leg o Santyl Ointment Wound #2 Left,Distal,Anterior Lower Leg o Santyl Ointment Secondary Dressing Wound #1 Left,Proximal,Anterior Lower Leg o ABD pad o Dry Gauze - netting Intriago, Fillmore ([id number]) Wound #2 Left,Distal,Anterior Lower Leg o ABD pad o Dry Gauze - netting Dressing Change Frequency Wound #1 Left,Proximal,Anterior Lower Leg o Change dressing every day. Wound #2 Left,Distal,Anterior Lower Leg o Change dressing every day. Follow-up Appointments Wound #1 Left,Proximal,Anterior Lower Leg o Return Appointment in 1 week. Wound #2 Left,Distal,Anterior Lower Leg o Return Appointment in 1 week. Edema Control Wound #1 Left,Proximal,Anterior Lower Leg o Elevate legs to the level of the heart and pump ankles as often as possible Wound #2 Left,Distal,Anterior Lower Leg o Elevate legs to the level of the heart and pump ankles as often as possible Additional Orders / Instructions Wound #1 Left,Proximal,Anterior Lower Leg o Stop Smoking o Increase protein intake. Wound #2 Left,Distal,Anterior Lower Leg o Stop Smoking o Increase protein intake. Home Health Wound #1 Left,Proximal,Anterior Lower Leg o Continue Home Health Visits o Home Health  Nurse may visit PRN to address patientos wound care needs. o FACE TO FACE ENCOUNTER: MEDICARE and MEDICAID PATIENTS: I certify that this patient is under my care and that I had a face-to-face encounter that meets the physician face-to-face encounter requirements with this patient on this date. The encounter with the patient was in whole or in part for the following MEDICAL CONDITION: (primary reason for Home Healthcare) MEDICAL NECESSITY: I certify, that based on my findings, NURSING services are a medically necessary home health service. HOME BOUND STATUS: I certify that my clinical findings support that this patient is homebound (i.e., Due to illness or injury, pt requires aid of supportive devices such as crutches, cane, wheelchairs, walkers,  the use of special MALLORY, ENRIQUES ([id number]) transportation or the assistance of another person to leave their place of residence. There is a normal inability to leave the home and doing so requires considerable and taxing effort. Other absences are for medical reasons / religious services and are infrequent or of short duration when for other reasons). o If current dressing causes regression in wound condition, may D/C ordered dressing product/s and apply Normal Saline Moist Dressing daily until next Wound Healing Center / Other MD appointment. Notify Wound Healing Center of regression in wound condition at [phone]. o Please direct any NON-WOUND related issues/requests for orders to patient's Primary Care Physician Wound #2 Left,Distal,Anterior Lower Leg o Continue Home Health Visits o Home Health Nurse may visit PRN to address patientos wound care needs. o FACE TO FACE ENCOUNTER: MEDICARE and MEDICAID PATIENTS: I certify that this patient is under my care and that I had a face-to-face encounter that meets the physician face-to-face encounter requirements with this patient on this date. The encounter with the patient was in whole or  in part for the following MEDICAL CONDITION: (primary reason for Home Healthcare) MEDICAL NECESSITY: I certify, that based on my findings, NURSING services are a medically necessary home health service. HOME BOUND STATUS: I certify that my clinical findings support that this patient is homebound (i.e., Due to illness or injury, pt requires aid of supportive devices such as crutches, cane, wheelchairs, walkers, the use of special transportation or the assistance of another person to leave their place of residence. There is a normal inability to leave the home and doing so requires considerable and taxing effort. Other absences are for medical reasons / religious services and are infrequent or of short duration when for other reasons). o If current dressing causes regression in wound condition, may D/C ordered dressing product/s and apply Normal Saline Moist Dressing daily until next Wound Healing Center / Other MD appointment. Notify Wound Healing Center of regression in wound condition at [phone]. o Please direct any NON-WOUND related issues/requests for orders to patient's Primary Care Physician Medications-please add to medication list. Wound #1 Left,Proximal,Anterior Lower Leg o Other: - vitamin C, zinc, multivitamin Wound #2 Left,Distal,Anterior Lower Leg o Other: - vitamin C, zinc, multivitamin Services and Therapies o Arterial Studies- Bilateral - Dr. Jari Sportsman Office Electronic Signature(s) Signed: 02/11/2016 3:36:55 PM By: Evlyn Kanner MD, FACS Signed: 02/11/2016 4:27:53 PM By: Alejandro Mulling Entered By: Alejandro Mulling on 02/11/2016 09:47:37 Jaime Zuniga ([id number]) -------------------------------------------------------------------------------- Problem List Details Patient Name: Jaime Zuniga Date of Service: 02/11/2016 8:45 AM Medical Record Number: [id number] Patient Account Number: [id number] Date of Birth/Sex: 1951-06-14 (65 y.o. 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 Severity: Patient states wound are getting worse. Duration: Patient has had the wound for > 3 months prior to seeking treatment at the wound center Timing: Pain in wound is Intermittent (comes and goes Context: The wound appeared gradually over time Modifying Factors: Other treatment(s) tried include:Augmentin and doxycycline Associated  Signs and Symptoms: Patient reports having increase swelling. 65 year old gentleman was being seen in the ER recently for wounds on his left lower extremity which have been draining and there is swelling and he was concerned about wound infection. in the ER x-ray of the left leg was done which showed postsurgical changes but no acute findings.he wrapped his leg in and Unna's boots and referred him to the wound center. earlier in June he had a DVT study done of the left lower extremity which showed no evidence of DVT but there was edema seen. no reflux was seen on that study. in June he was seen by a surgeon Dr. Lemar Livings who advised symptomatic treatment. the patient was also seen at the [facility] he has recently completed courses of Augmentin and doxycycline. 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 Wound History Patient presents with 2 open wounds that have been present for approximately month. Patient has been treating wounds in the following manner: peroxide, neosporin. The wounds have been healed in the past but have re-opened. Laboratory tests have not been performed in the last month. Patient reportedly has not tested positive for an antibiotic resistant organism. Patient reportedly has not tested positive for osteomyelitis. Patient reportedly has had testing performed to evaluate circulation in the legs. Patient experiences the following problems associated with their wounds: swelling. Patient History DURELL, LOFASO ([id number]) Information obtained from Patient. Allergies NKDA Family History Cancer - Father, No family history of Diabetes, Heart Disease, Hereditary Spherocytosis, Hypertension, Kidney Disease, Lung Disease, Seizures, Stroke, Thyroid Problems, Tuberculosis. Social History Former smoker - 5-6 a day, Marital Status - Single, Caffeine Use -  Daily. Medical History Respiratory Patient has history of Chronic Obstructive Pulmonary Disease (COPD) Cardiovascular Patient has history of Hypertension, Myocardial Infarction - hx Musculoskeletal Patient has history of Osteoarthritis Neurologic Patient has history of Dementia Review of Systems (ROS) Constitutional Symptoms (General Health) The patient has no complaints or symptoms. Eyes The patient has no complaints or symptoms. Ear/Nose/Mouth/Throat The patient has no complaints or symptoms. Hematologic/Lymphatic The patient has no complaints or symptoms. Gastrointestinal GERD Endocrine Complains or has symptoms of Thyroid disease. Genitourinary Complains or has symptoms of Incontinence/dribbling. Immunological The patient has no complaints or symptoms. Integumentary (Skin) Complains or has symptoms of Wounds. Neurologic parkinson's Oncologic The patient has no complaints or symptoms. Psychiatric Complains or has symptoms of Anxiety. DWANE, ANDRES ([id number]) Medications Coreg 6.25 mg tablet oral 1 1 tablet oral two times daily Neurontin 100 mg capsule oral 1 1 capsule oral three times daily Prinivil 10 mg tablet oral 1 1 tablet oral daily benztropine 1 mg tablet oral 1 1 tablet oral three times daily amantadine HCl 100 mg capsule oral 1 1 capsule oral daily Zyprexa 10  mg tablet oral one half tablet oral (5 mg.) two times daily Senna Plus 8.6 mg-50 mg tablet oral 2 2 tablets oral two times daily Lasix 20 mg tablet oral 1 1 tablet oral daily Augmentin 875 mg-125 mg tablet oral 1 1 tablet oral two times daily potassium chloride ER 20 mEq tablet,extended release oral one half tablet extended release oral daily Protonix 20 mg tablet,delayed release oral 1 1 tablet,delayed release (DR/EC) oral daily trazodone 50 mg tablet oral 1 1 tablet oral nightly doxycycline hyclate 100 mg tablet oral 1 1 tablet oral two times daily Synthroid 25 mcg tablet oral 1 1 tablet oral  daily Objective Constitutional Pulse regular. Respirations normal and unlabored. Afebrile. Vitals Time Taken: 8:51 AM, Height: 72 in, Source: Stated, Weight: 210 lbs, Source: Stated, BMI: 28.5, Temperature: 97.7 F, Pulse: 72 bpm, Respiratory Rate: 16 breaths/min, Blood Pressure: 104/57 mmHg. Eyes Nonicteric. Reactive to light. Ears, Nose, Mouth, and Throat Lips, teeth, and gums WNL.Marland Kitchen Moist mucosa without lesions. Neck supple and nontender. No palpable supraclavicular or cervical adenopathy. Normal sized without goiter. Respiratory WNL. No retractions.. Cardiovascular Pedal Pulses WNL. ABI on the left was 0.67 in the right was 0.48. No clubbing, cyanosis or edema. Chest Breasts symmetical and no nipple discharge.. Breast tissue WNL, no masses, lumps, or tenderness.. Gastrointestinal (GI) Haskell, Kaysen ([id number]) Abdomen without masses or tenderness.. No liver or spleen enlargement or tenderness.. Lymphatic No adneopathy. No adenopathy. No adenopathy. Musculoskeletal Adexa without tenderness or enlargement.. Digits and nails w/o clubbing, cyanosis, infection, petechiae, ischemia, or inflammatory conditions.Marland Kitchen Psychiatric Judgement and insight Intact.. No evidence of depression, anxiety, or agitation.. General Notes: he has significant amount of subcutaneous and muscular calcification possibly heterotrophic calcification in his left lower extremity from previous injury and surgery. The ulcers are superficial but the granulation tissues directly on bone. No debridement was done today. Integumentary (Hair, Skin) No suspicious lesions. No crepitus or fluctuance. No peri-wound warmth or erythema. No masses.. Wound #1 status is Open. Original cause of wound was Gradually Appeared. The wound is located on the Left,Proximal,Anterior Lower Leg. The wound measures 2.2cm length x 0.7cm width x 0.3cm depth; 1.21cm^2 area and 0.363cm^3 volume. There is fat exposed. There is no tunneling noted,  however, there is undermining starting at 9:00 and ending at 5:00 with a maximum distance of 0.3cm. There is a large amount of serosanguineous drainage noted. The wound margin is distinct with the outline attached to the wound base. There is medium (34-66%) red granulation within the wound bed. There is a medium (34-66%) amount of necrotic tissue within the wound bed including Adherent Slough. The periwound skin appearance exhibited: Localized Edema, Moist, Erythema. The periwound skin appearance did not exhibit: Dry/Scaly, Maceration. The surrounding wound skin color is noted with erythema which is circumferential. Periwound temperature was noted as Cool/Cold. The periwound has tenderness on palpation. Wound #2 status is Open. Original cause of wound was Gradually Appeared. The wound is located on the [facility] Lower Leg. The wound measures 1.5cm length x 1cm width x 0.2cm depth; 1.178cm^2 area and 0.236cm^3 volume. There is no tunneling noted. There is a large amount of serosanguineous drainage noted. The wound margin is distinct with the outline attached to the wound base. There is small (1-33%) red granulation within the wound bed. There is a large (67-100%) amount of necrotic tissue within the wound bed including Adherent Slough. The periwound skin appearance exhibited: Erythema. The surrounding wound skin color is noted with erythema which is circumferential. Periwound temperature was noted  as Cool/Cold. The periwound has tenderness on palpation. Assessment Active Problems ICD-10 L97.222 - Non-pressure chronic ulcer of left calf with fat layer exposed M61.462 - Other calcification of muscle, left lower leg Azucena CecilBURTON, Joeanthony ([id number]) I73.9 - Peripheral vascular disease, unspecified this 65 year old gentleman who has dementia, hypertension, GERD, COPD and has had previous workup done for peripheral vascular disease has calcification in his left lower extremity in the region of  his previous surgery and injury. After thorough review I have recommended: 1. Santyl ointment to the wounds and lightly covered with a gauze and Kerlix dressing to be changed daily 2. X-rays reviewed from recent studies and did not show any osteomyelitis. 3. Arterial duplex study to be performed and we will refer him to Dr. Genia DelAredia's group. 4. spent 3 minutes discussing with him the need to completely give up smoking and have discussed the risk benefits noted as well he says he is currently compliant 5. regular visits to the wound center Plan Wound Cleansing: Wound #1 Left,Proximal,Anterior Lower Leg: Clean wound with Normal Saline. Cleanse wound with mild soap and water May Shower, gently pat wound dry prior to applying new dressing. Wound #2 Left,Distal,Anterior Lower Leg: Clean wound with Normal Saline. Cleanse wound with mild soap and water May Shower, gently pat wound dry prior to applying new dressing. Anesthetic: Wound #1 Left,Proximal,Anterior Lower Leg: Topical Lidocaine 4% cream applied to wound bed prior to debridement - for clinic use Wound #2 Left,Distal,Anterior Lower Leg: Topical Lidocaine 4% cream applied to wound bed prior to debridement - for clinic use Skin Barriers/Peri-Wound Care: Wound #1 Left,Proximal,Anterior Lower Leg: Barrier cream Wound #2 Left,Distal,Anterior Lower Leg: Barrier cream Primary Wound Dressing: Wound #1 Left,Proximal,Anterior Lower Leg: Santyl Ointment Wound #2 Left,Distal,Anterior Lower Leg: Santyl Ointment Secondary Dressing: Wound #1 Left,Proximal,Anterior Lower Leg: ABD pad Dry Gauze - netting Gillyard, Stelios ([id number]) Wound #2 Left,Distal,Anterior Lower Leg: ABD pad Dry Gauze - netting Dressing Change Frequency: Wound #1 Left,Proximal,Anterior Lower Leg: Change dressing every day. Wound #2 Left,Distal,Anterior Lower Leg: Change dressing every day. Follow-up Appointments: Wound #1 Left,Proximal,Anterior Lower Leg: Return  Appointment in 1 week. Wound #2 Left,Distal,Anterior Lower Leg: Return Appointment in 1 week. Edema Control: Wound #1 Left,Proximal,Anterior Lower Leg: Elevate legs to the level of the heart and pump ankles as often as possible Wound #2 Left,Distal,Anterior Lower Leg: Elevate legs to the level of the heart and pump ankles as often as possible Additional Orders / Instructions: Wound #1 Left,Proximal,Anterior Lower Leg: Stop Smoking Increase protein intake. Wound #2 Left,Distal,Anterior Lower Leg: Stop Smoking Increase protein intake. Home Health: Wound #1 Left,Proximal,Anterior Lower Leg: Continue Home Health Visits Home Health Nurse may visit PRN to address patient s wound care needs. FACE TO FACE ENCOUNTER: MEDICARE and MEDICAID PATIENTS: I certify that this patient is under my care and that I had a face-to-face encounter that meets the physician face-to-face encounter requirements with this patient on this date. The encounter with the patient was in whole or in part for the following MEDICAL CONDITION: (primary reason for Home Healthcare) MEDICAL NECESSITY: I certify, that based on my findings, NURSING services are a medically necessary home health service. HOME BOUND STATUS: I certify that my clinical findings support that this patient is homebound (i.e., Due to illness or injury, pt requires aid of supportive devices such as crutches, cane, wheelchairs, walkers, the use of special transportation or the assistance of another person to leave their place of residence. There is a normal inability to leave the home and doing so  requires considerable and taxing effort. Other absences are for medical reasons / religious services and are infrequent or of short duration when for other reasons). If current dressing causes regression in wound condition, may D/C ordered dressing product/s and apply Normal Saline Moist Dressing daily until next Wound Healing Center / Other MD appointment. Notify  Wound Healing Center of regression in wound condition at [phone]. Please direct any NON-WOUND related issues/requests for orders to patient's Primary Care Physician Wound #2 Left,Distal,Anterior Lower Leg: Continue Home Health Visits Home Health Nurse may visit PRN to address patient s wound care needs. FACE TO FACE ENCOUNTER: MEDICARE and MEDICAID PATIENTS: I certify that this patient is under my care and that I had a face-to-face encounter that meets the physician face-to-face encounter requirements with this patient on this date. The encounter with the patient was in whole or in part for the following MEDICAL CONDITION: (primary reason for Home Healthcare) MEDICAL NECESSITY: I certify, that based on my findings, NURSING services are a medically necessary home health service. HOME [place], [place] ([id number]) BOUND STATUS: I certify that my clinical findings support that this patient is homebound (i.e., Due to illness or injury, pt requires aid of supportive devices such as crutches, cane, wheelchairs, walkers, the use of special transportation or the assistance of another person to leave their place of residence. There is a normal inability to leave the home and doing so requires considerable and taxing effort. Other absences are for medical reasons / religious services and are infrequent or of short duration when for other reasons). If current dressing causes regression in wound condition, may D/C ordered dressing product/s and apply Normal Saline Moist Dressing daily until next Wound Healing Center / Other MD appointment. Notify Wound Healing Center of regression in wound condition at [phone]. Please direct any NON-WOUND related issues/requests for orders to patient's Primary Care Physician Medications-please add to medication list.: Wound #1 Left,Proximal,Anterior Lower Leg: Other: - vitamin C, zinc, multivitamin Wound #2 Left,Distal,Anterior Lower Leg: Other: - vitamin C, zinc,  multivitamin Services and Therapies ordered were: Arterial Studies- Bilateral - Dr. Jari Sportsman Office this 65 year old gentleman who has dementia, hypertension, GERD, COPD and has had previous workup done for peripheral vascular disease has calcification in his left lower extremity in the region of his previous surgery and injury. After thorough review I have recommended: 1. Santyl ointment to the wounds and lightly covered with a gauze and Kerlix dressing to be changed daily 2. X-rays reviewed from recent studies and did not show any osteomyelitis. 3. Arterial duplex study to be performed and we will refer him to Dr. Genia Del group. 4. spent 3 minutes discussing with him the need to completely give up smoking and have discussed the risk benefits noted as well he says he is currently compliant 5. regular visits to the wound center Electronic Signature(s) Signed: 02/11/2016 9:55:48 AM By: Evlyn Kanner MD, FACS Entered By: Evlyn Kanner on 02/11/2016 09:55:47 Jaime Zuniga ([id number]) -------------------------------------------------------------------------------- ROS/PFSH Details Patient Name: Jaime Zuniga Date of Service: 02/11/2016 8:45 AM Medical Record Number: [id number] Patient Account Number: [id number] Date of Birth/Sex: [birth year] (65 y.o. Male) Treating RN: Phillis Haggis Primary Care Physician: Evelene Croon Other Clinician: Referring Physician: Daryel November Treating Physician/Extender: Rudene Re in Treatment: 0 Information Obtained From Patient Wound History Do you currently have one or more open woundso Yes How many open wounds do you currently haveo 2 Approximately how long have you had your woundso month How have you been treating your wound(s) until nowo peroxide,  neosporin Has your wound(s) ever healed and then re-openedo Yes Have you had any lab work done in the past montho No Have you tested positive for an antibiotic resistant organism (MRSA,  VRE)o No Have you tested positive for osteomyelitis (bone infection)o No Have you had any tests for circulation on your legso Yes [facility] Have you had other problems associated with your woundso Swelling Endocrine Complaints and Symptoms: Positive for: Thyroid disease Genitourinary Complaints and Symptoms: Positive for: Incontinence/dribbling Integumentary (Skin) Complaints and Symptoms: Positive for: Wounds Psychiatric Complaints and Symptoms: Positive for: Anxiety Constitutional Symptoms (General Health) Complaints and Symptoms: No Complaints or Symptoms Eyes Pitz, Isabel ([id number]) Complaints and Symptoms: No Complaints or Symptoms Ear/Nose/Mouth/Throat Complaints and Symptoms: No Complaints or Symptoms Hematologic/Lymphatic Complaints and Symptoms: No Complaints or Symptoms Respiratory Medical History: Positive for: Chronic Obstructive Pulmonary Disease (COPD) Cardiovascular Medical History: Positive for: Hypertension; Myocardial Infarction - hx Gastrointestinal Complaints and Symptoms: Review of System Notes: GERD Immunological Complaints and Symptoms: No Complaints or Symptoms Musculoskeletal Medical History: Positive for: Osteoarthritis Neurologic Complaints and Symptoms: Review of System Notes: parkinson's Medical History: Positive for: Dementia Oncologic Complaints and Symptoms: No Complaints or Symptoms KAREM, TOMASO ([id number]) Family and Social History Cancer: Yes - Father; Diabetes: No; Heart Disease: No; Hereditary Spherocytosis: No; Hypertension: No; Kidney Disease: No; Lung Disease: No; Seizures: No; Stroke: No; Thyroid Problems: No; Tuberculosis: No; Former smoker - 5-6 a day; Marital Status - Single; Caffeine Use: Daily; Financial Concerns: No; Food, Clothing or Shelter Needs: No; Support System Lacking: No; Transportation Concerns: No; Advanced Directives: No; Patient does not  want information on Advanced Directives; Do not resuscitate: No; Living Will: Yes (Not Provided); Medical Power of Attorney: Yes - Clarissa (Not Provided) Physician Affirmation I have reviewed and agree with the above information. Electronic Signature(s) Signed: 02/11/2016 3:36:55 PM By: Evlyn Kanner MD, FACS Signed: 02/11/2016 4:27:53 PM By: Alejandro Mulling Entered By: Evlyn Kanner on 02/11/2016 09:27:21 Jaime Zuniga ([id number]) -------------------------------------------------------------------------------- SuperBill Details Patient Name: Jaime Zuniga Date of Service: 02/11/2016 Medical Record Number: [id number] Patient Account Number: [id number] Date of Birth/Sex: 1950-08-25 (65 y.o. Male) Treating RN: Phillis Haggis Primary Care Physician: Evelene Croon Other Clinician: Referring Physician: Daryel November Treating Physician/Extender: Rudene Re in Treatment: 0 Diagnosis Coding ICD-10 Codes Code Description [phone] Non-pressure chronic ulcer of left calf with fat layer exposed M61.462 Other calcification of muscle, left lower leg I73.9 Peripheral vascular disease, unspecified Facility Procedures CPT4 Code: 41324401 Description: 02725 - WOUND CARE VISIT-LEV 5 EST PT Modifier: Quantity: 1 CPT4 Code: 36644034 Description: 99406-SMOKING CESSATION 3-10MINS ICD-10 Description Diagnosis L97.222 Non-pressure chronic ulcer of left calf with fat l M61.462 Other calcification of muscle, left lower leg I73.9 Peripheral vascular disease, unspecified Modifier: ayer exposed Quantity: 1 Physician Procedures CPT4 Code: 7425956 Description: 99204 - WC PHYS LEVEL 4 - NEW PT ICD-10 Description Diagnosis L97.222 Non-pressure chronic ulcer of left calf with fat l M61.462 Other calcification of muscle, left lower leg I73.9 Peripheral vascular disease, unspecified Modifier: ayer exposed Quantity: 1 CPT4 Code: 38756 [place], [place] Description: 99406- SMOKING CESSATION 3-10 MINS  ICD-10 Description Diagnosis L97.222 Non-pressure chronic ulcer of left calf with fat l M61.462 Other calcification of muscle, left lower leg I73.9 Peripheral vascular disease, unspecified ([id number]) Modifier: ayer exposed Quantity: 1 Electronic Signature(s) Signed: 02/11/2016 3:36:55 PM By: Evlyn Kanner MD, FACS Signed: 02/11/2016 4:27:53 PM By: Alejandro Mulling Previous Signature: 02/11/2016 9:56:09 AM Version By: Evlyn Kanner MD, FACS Entered By:  Alejandro Mulling on 02/11/2016 10:30:28

## 2016-02-12 NOTE — Progress Notes (Signed)
KAYA, KLAUSING ([id number]) Visit Report for 02/11/2016 Abuse/Suicide Risk Screen Details Patient Name: Jaime, Zuniga Date of Service: 02/11/2016 8:45 AM Medical Record Number: [id number] Patient Account Number: [id number] Date of Birth/Sex: [birth year] (65 y.o. Male) Treating RN: Phillis Haggis Primary Care Physician: Evelene Croon Other Clinician: Referring Physician: Daryel November Treating Physician/Extender: Rudene Re in Treatment: 0 Abuse/Suicide Risk Screen Items Answer ABUSE/SUICIDE RISK SCREEN: Has anyone close to you tried to hurt or harm you recentlyo No Do you feel uncomfortable with anyone in your familyo No Has anyone forced you do things that you didnot want to doo No Do you have any thoughts of harming yourselfo No Patient displays signs or symptoms of abuse and/or neglect. No Electronic Signature(s) Signed: 02/11/2016 4:27:53 PM By: Alejandro Mulling Entered By: Alejandro Mulling on 02/11/2016 09:02:32 Jaime Zuniga ([id number]) -------------------------------------------------------------------------------- Activities of Daily Living Details Patient Name: Jaime Zuniga Date of Service: 02/11/2016 8:45 AM Medical Record Number: [id number] Patient Account Number: [id number] Date of Birth/Sex: March 15, 1951 (65 y.o. Male) Treating RN: Phillis Haggis Primary Care Physician: Evelene Croon Other Clinician: Referring Physician: Daryel November Treating Physician/Extender: Rudene Re in Treatment: 0 Activities of Daily Living Items Answer Activities of Daily Living (Please select one for each item) Drive Automobile Not Able Take Medications Need Assistance Use Telephone Need Assistance Care for Appearance Need Assistance Use Toilet Completely Able Bath / Shower Need Assistance Dress Self Need Assistance Feed Self Completely Able Walk Need Assistance Get In / Out Bed Need Assistance Housework Not Able Prepare Meals Not Able Handle Money  Not Able Shop for Self Not Able Electronic Signature(s) Signed: 02/11/2016 4:27:53 PM By: Alejandro Mulling Entered By: Alejandro Mulling on 02/11/2016 09:03:07 Jaime Zuniga ([id number]) -------------------------------------------------------------------------------- Education Assessment Details Patient Name: Jaime Zuniga Date of Service: 02/11/2016 8:45 AM Medical Record Number: [id number] Patient Account Number: [id number] Date of Birth/Sex: Jul 25, 1951 (65 y.o. Male) Treating RN: Phillis Haggis Primary Care Physician: Evelene Croon Other Clinician: Referring Physician: Daryel November Treating Physician/Extender: Rudene Re in Treatment: 0 Primary Learner Assessed: Patient Learning Preferences/Education Level/Primary Language Learning Preference: Explanation, Printed Material Highest Education Level: College or Above Preferred Language: English Cognitive Barrier Assessment/Beliefs Language Barrier: No Translator Needed: No Memory Deficit: No Emotional Barrier: No Cultural/Religious Beliefs Affecting Medical No Care: Physical Barrier Assessment Impaired Vision: No Impaired Hearing: No Decreased Hand dexterity: No Knowledge/Comprehension Assessment Knowledge Level: High Comprehension Level: High Ability to understand written High instructions: Ability to understand verbal High instructions: Motivation Assessment Anxiety Level: Calm Cooperation: Cooperative Education Importance: Acknowledges Need Interest in Health Problems: Asks Questions Perception: Coherent Willingness to Engage in Self- High Management Activities: Readiness to Engage in Self- High Management Activities: Electronic Signature(s) [place], [place] ([id number]) Signed: 02/11/2016 4:27:53 PM By: Alejandro Mulling Entered By: Alejandro Mulling on 02/11/2016 09:03:29 Jaime Zuniga ([id number]) -------------------------------------------------------------------------------- Fall Risk  Assessment Details Patient Name: Jaime Zuniga Date of Service: 02/11/2016 8:45 AM Medical Record Number: [id number] Patient Account Number: [id number] Date of Birth/Sex: 11-21-1950 (65 y.o. Male) Treating RN: Phillis Haggis Primary Care Physician: Evelene Croon Other Clinician: Referring Physician: Daryel November Treating Physician/Extender: Rudene Re in Treatment: 0 Fall Risk Assessment Items Have you had 2 or more falls in the last 12 monthso 0 Yes Have you had any fall that resulted in injury in the last 12 monthso 0 No FALL RISK ASSESSMENT: History of falling - immediate or within 3 months 25 Yes Secondary diagnosis 15 Yes Ambulatory aid None/bed rest/wheelchair/nurse 0 No Crutches/cane/walker 15 Yes Furniture 0 No IV Access/Saline Lock 0 No Gait/Training  Normal/bed rest/immobile 0 No Weak 0 No Impaired 20 Yes Mental Status Oriented to own ability 0 No Electronic Signature(s) Signed: 02/11/2016 4:27:53 PM By: Alejandro MullingPinkerton, Debra Entered By: Alejandro MullingPinkerton, Debra on 02/11/2016 09:04:08 Jaime MaclachlanBURTON, Jaime Zuniga ([id number]) -------------------------------------------------------------------------------- Foot Assessment Details Patient Name: Jaime MaclachlanBURTON, Jaime Zuniga Date of Service: 02/11/2016 8:45 AM Medical Record Number: [id number] Patient Account Number: [id number] Date of Birth/Sex: [birth year] 3(65 y.o. Male) Treating RN: Phillis HaggisPinkerton, Debi Primary Care Physician: Evelene CroonNiemeyer, Meindert Other Clinician: Referring Physician: Daryel NovemberWILLIAMS, JONATHAN Treating Physician/Extender: Rudene ReBritto, Errol Weeks in Treatment: 0 Foot Assessment Items Site Locations + = Sensation present, - = Sensation absent, C = Callus, U = Ulcer R = Redness, W = Warmth, M = Maceration, PU = Pre-ulcerative lesion F = Fissure, S = Swelling, D = Dryness Assessment Right: Left: Other Deformity: No No Prior Foot Ulcer: No No Prior Amputation: No No Charcot Joint: No No Ambulatory Status: Ambulatory With Help Assistance  Device: Walker Gait: Surveyor, miningUnsteady Electronic Signature(s) Signed: 02/11/2016 4:27:53 PM By: Alejandro MullingPinkerton, Debra Entered By: Alejandro MullingPinkerton, Debra on 02/11/2016 09:09:01 Jaime MaclachlanBURTON, Jaime Zuniga ([id number]) -------------------------------------------------------------------------------- Nutrition Risk Assessment Details Patient Name: Jaime MaclachlanBURTON, Jaime Zuniga Date of Service: 02/11/2016 8:45 AM Medical Record Number: [id number] Patient Account Number: [id number] Date of Birth/Sex: [birth year] [age] y.o. Male) Treating RN: Phillis HaggisPinkerton, Debi Primary Care Physician: Evelene CroonNiemeyer, Meindert Other Clinician: Referring Physician: Daryel NovemberWILLIAMS, JONATHAN Treating Physician/Extender: Rudene ReBritto, Errol Weeks in Treatment: 0 Height (in): 72 Weight (lbs): 210 Body Mass Index (BMI): 28.5 Nutrition Risk Assessment Items NUTRITION RISK SCREEN: I have an illness or condition that made me change the kind and/or 2 Yes amount of food I eat I eat fewer than two meals per day 0 No I eat few fruits and vegetables, or milk products 0 No I have three or more drinks of beer, liquor or wine almost every day 0 No I have tooth or mouth problems that make it hard for me to eat 0 No I don't always have enough money to buy the food I need 0 No I eat alone most of the time 0 No I take three or more different prescribed or over-the-counter drugs a 1 Yes day Without wanting to, I have lost or gained 10 pounds in the last six 2 Yes months I am not always physically able to shop, cook and/or feed myself 2 Yes Nutrition Protocols Good Risk Protocol Moderate Risk Protocol Electronic Signature(s) Signed: 02/11/2016 4:27:53 PM By: Alejandro MullingPinkerton, Debra Entered By: Alejandro MullingPinkerton, Debra on 02/11/2016 09:04:29

## 2016-02-16 ENCOUNTER — Ambulatory Visit: Payer: Medicare Other

## 2016-02-16 ENCOUNTER — Encounter (INDEPENDENT_AMBULATORY_CARE_PROVIDER_SITE_OTHER): Payer: Self-pay

## 2016-02-16 DIAGNOSIS — L97909 Non-pressure chronic ulcer of unspecified part of unspecified lower leg with unspecified severity: Secondary | ICD-10-CM

## 2016-02-17 ENCOUNTER — Encounter: Payer: Self-pay | Admitting: General Surgery

## 2016-02-17 ENCOUNTER — Encounter (HOSPITAL_BASED_OUTPATIENT_CLINIC_OR_DEPARTMENT_OTHER): Payer: Medicare Other | Admitting: General Surgery

## 2016-02-17 ENCOUNTER — Encounter: Payer: Self-pay | Admitting: Cardiovascular Disease

## 2016-02-17 ENCOUNTER — Ambulatory Visit (INDEPENDENT_AMBULATORY_CARE_PROVIDER_SITE_OTHER): Payer: Medicare Other | Admitting: Cardiovascular Disease

## 2016-02-17 VITALS — BP 100/64 | HR 71 | Ht 72.0 in | Wt 189.5 lb

## 2016-02-17 DIAGNOSIS — L97812 Non-pressure chronic ulcer of other part of right lower leg with fat layer exposed: Secondary | ICD-10-CM

## 2016-02-17 DIAGNOSIS — S81802D Unspecified open wound, left lower leg, subsequent encounter: Secondary | ICD-10-CM

## 2016-02-17 DIAGNOSIS — L97522 Non-pressure chronic ulcer of other part of left foot with fat layer exposed: Secondary | ICD-10-CM | POA: Diagnosis not present

## 2016-02-17 DIAGNOSIS — L97221 Non-pressure chronic ulcer of left calf limited to breakdown of skin: Secondary | ICD-10-CM | POA: Diagnosis not present

## 2016-02-17 DIAGNOSIS — L97222 Non-pressure chronic ulcer of left calf with fat layer exposed: Secondary | ICD-10-CM | POA: Diagnosis not present

## 2016-02-17 DIAGNOSIS — I252 Old myocardial infarction: Secondary | ICD-10-CM

## 2016-02-17 NOTE — Progress Notes (Signed)
Venous ulcers left leg.  Start treating with collagen.  Wound debrided with curette. 1x2 cm

## 2016-02-17 NOTE — Progress Notes (Addendum)
Jaime Jaime Zuniga, [place] ([id number]) Visit Report for 02/17/2016 Chief Complaint Document Details Patient Name: Jaime Jaime Zuniga, Jaime Jaime Zuniga Date of Service: 02/17/2016 9:45 AM Medical Record Number: [id number] Patient Account Number: [id number] Date of Birth/Sex: 20-Jun-1951 (65 y.o. 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 where he's had previous injury and this has been there for several months Electronic Signature(s) Signed: 02/17/2016 10:00:14 AM By: Ardath SaxParker, Saagar Tortorella MD Entered By: Ardath SaxParker, Sieanna Vanstone on 02/17/2016 10:00:13 Jaime Jaime Zuniga, [name] ([id number]) -------------------------------------------------------------------------------- Debridement Details Patient Name: Jaime Jaime Zuniga, Jaime Jaime Zuniga Date of Service: 02/17/2016 9:45 AM Medical Record Number: [id number] Patient Account Number: [id number] Date of Birth/Sex: 20-Jun-1951 (65 y.o. Male) Treating RN: Phillis HaggisPinkerton, Debi Primary Care Physician: Evelene CroonNiemeyer, Meindert Other Clinician: Referring Physician: Evelene CroonNiemeyer, Meindert Treating Physician/Extender: Ardath SaxPARKER, Deral Schellenberg Weeks in Treatment: 0 Debridement Performed for Wound #2 Left,Distal,Anterior Lower Leg Assessment: Performed By: Physician Ardath SaxPARKER, Adhvik Canady, MD Debridement: Debridement Pre-procedure Yes Verification/Time Out Taken: Start Time: 10:15 Pain Control: Other : lidocaine 4% cream Level: Skin/Subcutaneous Tissue Total Area Debrided (L x 1.5 (cm) x 0.7 (cm) = 1.05 (cm) W): Tissue and other Viable, Non-Viable, Exudate, Fibrin/Slough, Subcutaneous material debrided: Instrument: Curette Bleeding: Minimum Hemostasis Achieved: Pressure End Time: 10:16 Procedural Pain: 0 Post Procedural Pain: 0 Response to Treatment: Procedure was  tolerated well Post Debridement Measurements of Total Wound Length: (cm) 1.5 Width: (cm) 0.7 Depth: (cm) 0.2 Volume: (cm) 0.165 Post Procedure Diagnosis Same as Pre-procedure Electronic Signature(s) Signed: 02/17/2016 4:15:37 PM By: Alejandro MullingPinkerton, Debra Signed: 02/17/2016 4:50:06 PM By: Ardath SaxParker, Lerae Langham MD Entered By: Alejandro MullingPinkerton, Debra on 02/17/2016 10:27:11 Jaime Jaime Zuniga, Alejandro ([id number]) -------------------------------------------------------------------------------- HPI Details Patient Name: Jaime Jaime Zuniga, Jaime Zuniga Date of Service: 02/17/2016 9:45 AM Medical Record Number: [id number] Patient Account Number: [id number] Date of Birth/Sex: 20-Jun-1951 (65 y.o. Male) Treating RN: Phillis HaggisPinkerton, Debi Primary Care Physician: Evelene CroonNiemeyer, Meindert Other Clinician: Referring Physician: Evelene CroonNiemeyer, Meindert Treating Physician/Extender: Ardath SaxPARKER, Maveryck Bahri Weeks in Treatment: 0 History of Present Illness Location: left lower extremity anterior shin area Quality: Patient reports experiencing a dull pain to affected area(s). Severity: Patient states wound are getting worse. Duration: Patient has had the wound for > 3 months prior to seeking treatment at the wound center Timing: Pain in wound is Intermittent (comes and goes Context: The wound appeared gradually over time Modifying Factors: Other treatment(s) tried include:Augmentin and doxycycline Associated Signs and Symptoms: Patient reports having increase swelling. HPI Description: 65 year old gentleman was being seen in the ER recently for wounds on his left lower extremity which have been draining and there is swelling and he was concerned about wound infection. in the ER x-ray of the left leg was done which showed postsurgical changes but no acute findings.he wrapped his leg in and Unna's boots and referred him to the wound center. earlier in June he had a DVT study done of the left lower extremity which showed no evidence of DVT but there was edema seen. no reflux was  seen on that study. in June he was seen by a surgeon Dr. Lemar LivingsByrnett who advised symptomatic treatment. the patient was [facility] he has recently completed courses of Augmentin and doxycycline. 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 Electronic Signature(s) Signed: 02/17/2016 10:01:10 AM By: Ardath SaxParker, Anastazia Creek MD Entered By:  Ardath Sax on 02/17/2016 10:01:09 JERYL, UMHOLTZ ([id number]) -------------------------------------------------------------------------------- Physical Exam Details Patient Name: [name], Jaime Zuniga Date of Service: 02/17/2016 9:45 AM Medical Record Number: [id number] Patient Account Number: [id number] Date of Birth/Sex: 16-Apr-1951 (65 y.o. Male) Treating RN: Phillis Haggis Primary Care Physician: Evelene Croon Other Clinician: Referring Physician: Evelene Croon Treating Physician/Extender: Ardath Sax Weeks in Treatment: 0 Electronic Signature(s) Signed: 02/17/2016 10:01:21 AM By: Ardath Sax MD Entered By: Ardath Sax on 02/17/2016 10:01:20 Jaime Jaime Zuniga ([id number]) -------------------------------------------------------------------------------- Physician Orders Details Patient Name: Jaime Jaime Zuniga Date of Service: 02/17/2016 9:45 AM Medical Record Number: [id number] Patient Account Number: [id number] Date of Birth/Sex: 1951-07-20 (65 y.o. Male) Treating RN: Phillis Haggis Primary Care Physician: Evelene Croon Other Clinician: Referring Physician: Evelene Croon Treating Physician/Extender: Elayne Snare in Treatment: 0 Verbal / Phone Orders: Yes ClinicianAshok Cordia, Debi Read Back and Verified: Yes Diagnosis Coding ICD-10 Coding Code Description [phone] Non-pressure chronic ulcer of left calf with fat layer exposed M61.462 Other calcification of muscle, left lower leg I73.9 Peripheral vascular  disease, unspecified Wound Cleansing Wound #1 Left,Proximal,Anterior Lower Leg o Clean wound with Normal Saline. o Cleanse wound with mild soap and water o May Shower, gently pat wound dry prior to applying new dressing. Wound #2 Left,Distal,Anterior Lower Leg o Clean wound with Normal Saline. o Cleanse wound with mild soap and water o May Shower, gently pat wound dry prior to applying new dressing. Anesthetic Wound #1 Left,Proximal,Anterior Lower Leg o Topical Lidocaine 4% cream applied to wound bed prior to debridement - for clinic use Wound #2 Left,Distal,Anterior Lower Leg o Topical Lidocaine 4% cream applied to wound bed prior to debridement - for clinic use Skin Barriers/Peri-Wound Care Wound #1 Left,Proximal,Anterior Lower Leg o Barrier cream Wound #2 Left,Distal,Anterior Lower Leg o Barrier cream Primary Wound Dressing Wound #1 Left,Proximal,Anterior Lower Leg o Prisma Ag - please moisten with saline Wound #2 Left,Distal,Anterior Lower Leg Pozo, Ronny ([id number]) o Prisma Ag - please moisten with saline Secondary Dressing Wound #1 Left,Proximal,Anterior Lower Leg o ABD pad o Dry Gauze - netting Wound #2 Left,Distal,Anterior Lower Leg o ABD pad o Dry Gauze - netting Dressing Change Frequency Wound #1 Left,Proximal,Anterior Lower Leg o Change dressing every other day. Wound #2 Left,Distal,Anterior Lower Leg o Change dressing every other day. Follow-up Appointments Wound #1 Left,Proximal,Anterior Lower Leg o Return Appointment in 1 week. Wound #2 Left,Distal,Anterior Lower Leg o Return Appointment in 1 week. Edema Control Wound #1 Left,Proximal,Anterior Lower Leg o Elevate legs to the level of the heart and pump ankles as often as possible Wound #2 Left,Distal,Anterior Lower Leg o Elevate legs to the level of the heart and pump ankles as often as possible Additional Orders / Instructions Wound #1  Left,Proximal,Anterior Lower Leg o Stop Smoking o Increase protein intake. Wound #2 Left,Distal,Anterior Lower Leg o Stop Smoking o Increase protein intake. Home Health Wound #1 Left,Proximal,Anterior Lower Leg o Continue Home Health Visits - [facility] o Home Health Nurse may visit PRN to address patientos wound care needs. GERALD, KUEHL ([id number]) o FACE TO FACE ENCOUNTER: MEDICARE and MEDICAID PATIENTS: I certify that this patient is under my care and that I had a face-to-face encounter that meets the physician face-to-face encounter requirements with this patient on this date. The encounter with the patient was in whole or in part for the following MEDICAL CONDITION: (primary reason for Home Healthcare) MEDICAL NECESSITY: I certify, that based on my findings, NURSING services are a medically necessary home health service. HOME BOUND STATUS: I certify that my clinical findings  support that this patient is homebound (i.e., Due to illness or injury, pt requires aid of supportive devices such as crutches, cane, wheelchairs, walkers, the use of special transportation or the assistance of another person to leave their place of residence. There is a normal inability to leave the home and doing so requires considerable and taxing effort. Other absences are for medical reasons / religious services and are infrequent or of short duration when for other reasons). o If current dressing causes regression in wound condition, may D/C ordered dressing product/s and apply Normal Saline Moist Dressing daily until next Wound Healing Center / Other MD appointment. Notify Wound Healing Center of regression in wound condition at [phone]. o Please direct any NON-WOUND related issues/requests for orders to patient's Primary Care Physician Wound #2 Left,Distal,Anterior Lower Leg o Continue Home Health Visits - [facility] o Home Health Nurse may visit PRN to address patientos wound  care needs. o FACE TO FACE ENCOUNTER: MEDICARE and MEDICAID PATIENTS: I certify that this patient is under my care and that I had a face-to-face encounter that meets the physician face-to-face encounter requirements with this patient on this date. The encounter with the patient was in whole or in part for the following MEDICAL CONDITION: (primary reason for Home Healthcare) MEDICAL NECESSITY: I certify, that based on my findings, NURSING services are a medically necessary home health service. HOME BOUND STATUS: I certify that my clinical findings support that this patient is homebound (i.e., Due to illness or injury, pt requires aid of supportive devices such as crutches, cane, wheelchairs, walkers, the use of special transportation or the assistance of another person to leave their place of residence. There is a normal inability to leave the home and doing so requires considerable and taxing effort. Other absences are for medical reasons / religious services and are infrequent or of short duration when for other reasons). o If current dressing causes regression in wound condition, may D/C ordered dressing product/s and apply Normal Saline Moist Dressing daily until next Wound Healing Center / Other MD appointment. Notify Wound Healing Center of regression in wound condition at [phone]. o Please direct any NON-WOUND related issues/requests for orders to patient's Primary Care Physician Medications-please add to medication list. Wound #1 Left,Proximal,Anterior Lower Leg o Other: - vitamin C, zinc, multivitamin Wound #2 Left,Distal,Anterior Lower Leg o Other: - vitamin C, zinc, multivitamin Notes Pt has apt with Dr. Kirke Corin when he leaves he caregiver is to discuss pts blood pressure with Dr. Maryruth Hancock, 82/45. LILIANA, BRENTLINGER ([id number]) Electronic Signature(s) Signed: 02/17/2016 4:15:37 PM By: Alejandro Mulling Signed: 02/17/2016 4:50:06 PM By: Ardath Sax MD Entered By: Alejandro Mulling on 02/17/2016 11:12:00 Jaime Jaime Zuniga ([id number]) -------------------------------------------------------------------------------- Problem List Details Patient Name: Jaime Jaime Zuniga Date of Service: 02/17/2016 9:45 AM Medical Record Number: [id number] Patient Account Number: [id number] Date of Birth/Sex: 1951/03/06 (65 y.o. Male) Treating RN: Phillis Haggis Primary Care Physician: Evelene Croon Other Clinician: Referring Physician: Evelene Croon Treating Physician/Extender: Elayne Snare in Treatment: 0 Active Problems ICD-10 Encounter Code Description Active Date Diagnosis L97.222 Non-pressure chronic ulcer of left calf with fat layer 02/11/2016 Yes exposed M61.462 Other calcification of muscle, left lower leg 02/11/2016 Yes I73.9 Peripheral vascular disease, unspecified 02/11/2016 Yes Inactive Problems Resolved Problems Electronic Signature(s) Signed: 02/17/2016 9:59:46 AM By: Ardath Sax MD Entered By: Ardath Sax on 02/17/2016 09:59:46 Jaime Jaime Zuniga ([id number]) -------------------------------------------------------------------------------- Progress Note Details Patient Name: Jaime Jaime Zuniga Date of Service: 02/17/2016 9:45 AM Medical Record Number: [id number] Patient Account Number: [id number] Date of Birth/Sex:  10-19-1950 (65 y.o. 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 Severity: Patient states wound are getting worse. Duration: Patient has had the wound for > 3 months prior to seeking treatment at the wound center Timing: Pain in wound is Intermittent (comes and goes Context: The wound appeared gradually over time Modifying Factors: Other treatment(s) tried include:Augmentin and doxycycline Associated Signs and Symptoms: Patient reports having increase swelling. 65 year old gentleman was being seen in the ER recently for wounds on his left lower extremity which have been draining and there is swelling and he was concerned about wound infection. in the ER x-ray of the left leg was done which showed postsurgical changes but no acute findings.he wrapped his leg in and Unna's boots and referred him to the wound center. earlier in June he had a DVT study done of the left lower extremity which showed no evidence of DVT but there was edema seen. no reflux was seen on that study. in June he was seen by a surgeon Dr. Lemar Livings who advised symptomatic treatment. the patient was also seen at the [facility] he has recently completed courses of Augmentin and doxycycline. 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 Objective Constitutional Vitals Time Taken: 10:07 AM, Height: 72 in, Weight: 210 lbs, BMI: 28.5, Temperature: 97.6 F, Pulse: 71 bpm, Respiratory Rate: 16 breaths/min, Blood Pressure: 82/45 mmHg. HAYWARD, RYLANDER ([id number]) Integumentary (Hair, Skin) Wound #1 status is Open. Original cause of wound was Gradually Appeared. The wound is located on the Left,Proximal,Anterior Lower Leg. The wound measures 1.4cm length x 0.5cm width x 0.3cm depth; 0.55cm^2 area and 0.165cm^3 volume. There is fat exposed. There is no tunneling noted. There is a large amount of serosanguineous drainage noted. The wound margin is distinct with the  outline attached to the wound base. There is large (67-100%) red granulation within the wound bed. There is a small (1-33%) amount of necrotic tissue within the wound bed including Adherent Slough. The periwound skin appearance exhibited: Localized Edema, Moist, Erythema. The periwound skin appearance did not exhibit: Dry/Scaly, Maceration. The surrounding wound skin color is noted with erythema which is circumferential. Periwound temperature was noted as Cool/Cold. The periwound has tenderness on palpation. Wound #2 status is Open. Original cause of wound was Gradually Appeared. The wound is located on the [facility] Lower Leg. The wound measures 1.5cm length x 0.7cm width x 0.2cm depth; 0.825cm^2 area and 0.165cm^3 volume. There is no tunneling or undermining noted. There is a large amount of serosanguineous drainage noted. The wound margin is distinct with the outline attached to the wound base. There is large (67-100%) red granulation within the wound bed. There is a small (1-33%) amount of necrotic tissue within the wound bed including Adherent Slough. The periwound skin appearance exhibited: Erythema. The surrounding wound skin color is noted with erythema which is circumferential. Periwound temperature was noted as Cool/Cold. The periwound has  tenderness on palpation. 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 There was a Skin/Subcutaneous Tissue Debridement ([id number]) debridement with total area of 1.05 sq cm performed by Ardath Sax, MD. with the following instrument(s): Curette to remove Viable and Non- Viable tissue/material including Exudate, Fibrin/Slough, and Subcutaneous after achieving pain  control using Other (lidocaine 4% cream). A time out was conducted prior to the start of the procedure. A Minimum amount of bleeding was controlled with Pressure. The procedure was tolerated well with a pain level of 0 throughout and a pain level of 0 following the procedure. Post Debridement Measurements: 1.5cm length x 0.7cm width x 0.2cm depth; 0.165cm^3 volume. AVANISH, CERULLO ([id number]) Post procedure Diagnosis Wound #2: Same as Pre-Procedure venous ulcer left leg debrided and Rx withcollagen. Much improved 1x2 cm Plan Wound Cleansing: Wound #1 Left,Proximal,Anterior Lower Leg: Clean wound with Normal Saline. Cleanse wound with mild soap and water May Shower, gently pat wound dry prior to applying new dressing. Wound #2 Left,Distal,Anterior Lower Leg: Clean wound with Normal Saline. Cleanse wound with mild soap and water May Shower, gently pat wound dry prior to applying new dressing. Anesthetic: Wound #1 Left,Proximal,Anterior Lower Leg: Topical Lidocaine 4% cream applied to wound bed prior to debridement - for clinic use Wound #2 Left,Distal,Anterior Lower Leg: Topical Lidocaine 4% cream applied to wound bed prior to debridement - for clinic use Skin Barriers/Peri-Wound Care: Wound #1 Left,Proximal,Anterior Lower Leg: Barrier cream Wound #2 Left,Distal,Anterior Lower Leg: Barrier cream Primary Wound Dressing: Wound #1 Left,Proximal,Anterior Lower Leg: Prisma Ag - please moisten with saline Wound #2 Left,Distal,Anterior Lower Leg: Prisma Ag - please moisten with saline Secondary Dressing: Wound #1 Left,Proximal,Anterior Lower Leg: ABD pad Dry Gauze - netting Wound #2 Left,Distal,Anterior Lower Leg: ABD pad Dry Gauze - netting Dressing Change Frequency: Wound #1 Left,Proximal,Anterior Lower Leg: Change dressing every other day. Wound #2 Left,Distal,Anterior Lower Leg: Change dressing every other day. Follow-up Appointments: Wound #1 Left,Proximal,Anterior Lower  Leg: RAMZEY, PETROVIC ([id number]) Return Appointment in 1 week. Wound #2 Left,Distal,Anterior Lower Leg: Return Appointment in 1 week. Edema Control: Wound #1 Left,Proximal,Anterior Lower Leg: Elevate legs to the level of the heart and pump ankles as often as possible Wound #2 Left,Distal,Anterior Lower Leg: Elevate legs to the level of the heart and pump ankles as often as possible Additional Orders / Instructions: Wound #1 Left,Proximal,Anterior Lower Leg: Stop Smoking Increase protein intake. Wound #2 Left,Distal,Anterior Lower Leg: Stop Smoking Increase protein intake. Home Health: Wound #1 Left,Proximal,Anterior Lower Leg: Continue Home Health Visits - [facility] Nurse may visit PRN to address patient s wound care needs. FACE TO FACE ENCOUNTER: MEDICARE and MEDICAID PATIENTS: I certify that this patient is under my care and that I had a face-to-face encounter that meets the physician face-to-face encounter requirements with this patient on this date. The encounter with the patient was in whole or in part for the following MEDICAL CONDITION: (primary reason for Home Healthcare) MEDICAL NECESSITY: I certify, that based on my findings, NURSING services are a medically necessary home health service. HOME BOUND STATUS: I certify that my clinical findings support that this patient is homebound (i.e., Due to illness or injury, pt requires aid of supportive devices such as crutches, cane, wheelchairs, walkers, the use of special transportation or  the assistance of another person to leave their place of residence. There is a normal inability to leave the home and doing so requires considerable and taxing effort. Other absences are for medical reasons / religious services and are infrequent or of short duration when for other reasons). If current dressing causes regression in wound condition, may D/C ordered dressing product/s and apply Normal Saline Moist Dressing daily until next  Wound Healing Center / Other MD appointment. Notify Wound Healing Center of regression in wound condition at [phone]. Please direct any NON-WOUND related issues/requests for orders to patient's Primary Care Physician Wound #2 Left,Distal,Anterior Lower Leg: Continue Home Health Visits - [facility] Nurse may visit PRN to address patient s wound care needs. FACE TO FACE ENCOUNTER: MEDICARE and MEDICAID PATIENTS: I certify that this patient is under my care and that I had a face-to-face encounter that meets the physician face-to-face encounter requirements with this patient on this date. The encounter with the patient was in whole or in part for the following MEDICAL CONDITION: (primary reason for Home Healthcare) MEDICAL NECESSITY: I certify, that based on my findings, NURSING services are a medically necessary home health service. HOME BOUND STATUS: I certify that my clinical findings support that this patient is homebound (i.e., Due to illness or injury, pt requires aid of supportive devices such as crutches, cane, wheelchairs, walkers, the use of special transportation or the assistance of another person to leave their place of residence. There is a normal inability to leave the home and doing so requires considerable and taxing effort. Other absences are for medical reasons / religious services and are infrequent or of short duration when for other reasons). If current dressing causes regression in wound condition, may D/C ordered dressing product/s and apply Normal Saline Moist Dressing daily until next Wound Healing Center / Other MD appointment. Notify Wound Healing Center of regression in wound condition at [phone]. Please direct any NON-WOUND related issues/requests for orders to patient's Primary Care Physician Medications-please add to medication list.: BENJAMIN, CASANAS ([id number]) Wound #1 Left,Proximal,Anterior Lower Leg: Other: - vitamin C, zinc,  multivitamin Wound #2 Left,Distal,Anterior Lower Leg: Other: - vitamin C, zinc, multivitamin General Notes: Pt has apt with Dr. Kirke Corin when he leaves he caregiver is to discuss pts blood pressure with Dr. Maryruth Hancock, 82/45. Follow-Up Appointments: A follow-up appointment should be scheduled. Medication Reconciliation completed and provided to Patient/Care Provider. A Patient Clinical Summary of Care was provided to [facility] Electronic Signature(s) Signed: 02/25/2016 11:03:01 AM By: Ardath Sax MD Previous Signature: 02/17/2016 10:43:52 AM Version By: Ardath Sax MD Entered By: Ardath Sax on 02/25/2016 11:03:01 Jaime Jaime Zuniga ([id number]) -------------------------------------------------------------------------------- SuperBill Details Patient Name: Jaime Jaime Zuniga Date of Service: 02/17/2016 Medical Record Number: [id number] Patient Account Number: [id number] Date of Birth/Sex: [birth year] (65 y.o. 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 Other calcification of muscle, left lower leg I73.9 Peripheral vascular disease, unspecified Facility Procedures CPT4 Code: 03474259 Description: 11042 - DEB SUBQ TISSUE 20 SQ CM/< ICD-10 Description Diagnosis I73.9 Peripheral vascular disease, unspecified Modifier: Quantity: 1 Physician Procedures CPT4 Code: 5638756 Description: 99213 - WC PHYS LEVEL 3 - EST PT ICD-10 Description Diagnosis L97.222 Non-pressure chronic ulcer of left calf with fat l Modifier: ayer exposed Quantity: 1 CPT4 Code: 4332951 Description: 11042 - WC PHYS SUBQ TISS 20  SQ CM ICD-10 Description Diagnosis I73.9 Peripheral vascular disease, unspecified Modifier: Quantity: 1 Electronic  Signature(s) Signed: 02/17/2016 10:44:35 AM By: Ardath Sax MD Entered By: Ardath Sax on 02/17/2016 10:44:35

## 2016-02-17 NOTE — Progress Notes (Addendum)
COOPER, MORONEY ([id number]) Visit Report for 02/17/2016 Arrival Information Details Patient Name: Jaime Zuniga, Jaime Zuniga Date of Service: 02/17/2016 9:45 AM Medical Record Number: [id number] Patient Account Number: [id number] Date of Birth/Sex: [birth year] (65 y.o. Male) Treating RN: Phillis Haggis Primary Care Physician: Evelene Croon Other Clinician: Referring Physician: Evelene Croon Treating Physician/Extender: Elayne Snare in Treatment: 0 Visit Information History Since Last Visit All ordered tests and consults were completed: No Patient Arrived: Dan Humphreys Added or deleted any medications: No Arrival Time: 10:07 Any new allergies or adverse reactions: No Accompanied By: caregiver Had a fall or experienced change in No Transfer Assistance: None activities of daily living that may affect Patient Identification Verified: Yes risk of falls: Secondary Verification Process Yes Signs or symptoms of abuse/neglect since last No Completed: visito Patient Requires Transmission-Based No Hospitalized since last visit: No Precautions: Pain Present Now: No Patient Has Alerts: No Electronic Signature(s) Signed: 02/17/2016 4:15:37 PM By: Alejandro Mulling Entered By: Alejandro Mulling on 02/17/2016 10:07:35 Jaime Zuniga ([id number]) -------------------------------------------------------------------------------- Encounter Discharge Information Details Patient Name: Jaime Zuniga Date of Service: 02/17/2016 9:45 AM Medical Record Number: [id number] Patient Account Number: [id number] Date of Birth/Sex: 1951/08/02 (65 y.o. Male) Treating RN: Phillis Haggis Primary Care Physician: Evelene Croon Other Clinician: Referring Physician: Evelene Croon Treating Physician/Extender: Elayne Snare in Treatment: 0 Encounter Discharge Information Items Discharge Pain Level: 0 Discharge Condition: Stable Ambulatory Status: Walker Discharge Destination: Nursing Home Transportation:  Other Accompanied By: caregiver Schedule Follow-up Appointment: Yes Medication Reconciliation completed Yes and provided to Patient/Care Tyshae Stair: Provided on Clinical Summary of Care: 02/17/2016 Form Type Recipient Paper Patient SB Electronic Signature(s) Signed: 02/17/2016 10:45:14 AM By: Ardath Sax MD Previous Signature: 02/17/2016 10:36:19 AM Version By: Gwenlyn Perking Entered By: Ardath Sax on 02/17/2016 10:45:14 Jaime Zuniga ([id number]) -------------------------------------------------------------------------------- Lower Extremity Assessment Details Patient Name: Jaime Zuniga Date of Service: 02/17/2016 9:45 AM Medical Record Number: [id number] Patient Account Number: [id number] Date of Birth/Sex: 1950/08/21 (65 y.o. Male) Treating RN: Phillis Haggis Primary Care Physician: Evelene Croon Other Clinician: Referring Physician: Evelene Croon Treating Physician/Extender: Ardath Sax Weeks in Treatment: 0 Vascular Assessment Pulses: Posterior Tibial Dorsalis Pedis Palpable: [Left:No] Doppler: [Left:Monophasic] Extremity colors, hair growth, and conditions: Extremity Color: [Left:Hyperpigmented] Temperature of Extremity: [Left:Cool] Capillary Refill: [Left:> 3 seconds] Electronic Signature(s) Signed: 02/17/2016 4:15:37 PM By: Alejandro Mulling Entered By: Alejandro Mulling on 02/17/2016 10:11:29 Jaime Zuniga ([id number]) -------------------------------------------------------------------------------- Multi Wound Chart Details Patient Name: Jaime Zuniga Date of Service: 02/17/2016 9:45 AM Medical Record Number: [id number] Patient Account Number: [id number] Date of Birth/Sex: 24-Mar-1951 (65 y.o. Male) Treating RN: Phillis Haggis Primary Care Physician: Evelene Croon Other Clinician: Referring Physician: Evelene Croon Treating Physician/Extender: Ardath Sax Weeks in Treatment: 0 Vital Signs Height(in): 72 Pulse(bpm): 71 Weight(lbs): 210  Blood Pressure 82/45 (mmHg): Body Mass Index(BMI): 28 Temperature(F): 97.6 Respiratory Rate 16 (breaths/min): Photos: [1:No Photos] [2:No Photos] [N/A:N/A] Wound Location: [1:Left Lower Leg - Anterior, Proximal] [2:Left Lower Leg - Anterior, Distal] [N/A:N/A] Wounding Event: [1:Gradually Appeared] [2:Gradually Appeared] [N/A:N/A] Primary Etiology: [1:Venous Leg Ulcer] [2:Venous Leg Ulcer] [N/A:N/A] Comorbid History: [1:Chronic Obstructive Pulmonary Disease (COPD), Hypertension, Myocardial Infarction, Osteoarthritis, Dementia] [2:Chronic Obstructive Pulmonary Disease (COPD), Hypertension, Myocardial Infarction, Osteoarthritis, Dementia] [N/A:N/A] Date Acquired: [1:01/26/2016] [2:01/26/2016] [N/A:N/A] Weeks of Treatment: [1:0] [2:0] [N/A:N/A] Wound Status: [1:Open] [2:Open] [N/A:N/A] Measurements L x W x D 1.4x0.5x0.3 [2:1.5x0.7x0.2] [N/A:N/A] (cm) Area (cm) : [1:0.55] [2:0.825] [N/A:N/A] Volume (cm) : [1:0.165] [2:0.165] [N/A:N/A] % Reduction in Area: [1:54.50%] [2:30.00%] [N/A:N/A] % Reduction in Volume: 54.50% [2:30.10%] [N/A:N/A] Classification: [1:Full Thickness Without Exposed Support Structures] [2:Full Thickness  Without Exposed Support Structures] [N/A:N/A] Exudate Amount: [1:Large] [2:Large] [N/A:N/A] Exudate Type: [1:Serosanguineous] [2:Serosanguineous] [N/A:N/A] Exudate Color: [1:red, brown] [2:red, brown] [N/A:N/A] Wound Margin: [1:Distinct, outline attached] [2:Distinct, outline attached] [N/A:N/A] Granulation Amount: [1:Large (67-100%)] [2:Large (67-100%)] [N/A:N/A] Granulation Quality: [1:Red] [2:Red] [N/A:N/A] Necrotic Amount: [1:Small (1-33%)] [2:Small (1-33%)] [N/A:N/A] Exposed Structures: [N/A:N/A] Fat: Yes Fascia: No Fascia: No Fat: No Tendon: No Tendon: No Muscle: No Muscle: No Joint: No Joint: No Bone: No Bone: No Epithelialization: None None N/A Debridement: N/A Debridement (54098- N/A 11047) Time-Out Taken: N/A Yes N/A Pain Control: N/A Other  N/A Tissue Debrided: N/A Fibrin/Slough, Exudates, N/A Subcutaneous Level: N/A Skin/Subcutaneous N/A Tissue Debridement Area (sq N/A 1.05 N/A cm): Instrument: N/A Curette N/A Bleeding: N/A Minimum N/A Hemostasis Achieved: N/A Pressure N/A Procedural Pain: N/A 0 N/A Post Procedural Pain: N/A 0 N/A Debridement Treatment N/A Procedure was tolerated N/A Response: well Post Debridement N/A 1.5x0.7x0.2 N/A Measurements L x W x D (cm) Post Debridement N/A 0.165 N/A Volume: (cm) Periwound Skin Texture: Edema: Yes No Abnormalities Noted N/A Periwound Skin Moist: Yes No Abnormalities Noted N/A Moisture: Maceration: No Dry/Scaly: No Periwound Skin Color: Erythema: Yes Erythema: Yes N/A Erythema Location: Circumferential Circumferential N/A Temperature: Cool/Cold Cool/Cold N/A Tenderness on Yes Yes N/A Palpation: Wound Preparation: Ulcer Cleansing: Ulcer Cleansing: N/A Rinsed/Irrigated with Rinsed/Irrigated with Saline Saline Topical Anesthetic Topical Anesthetic Applied: Other: lidocaine Applied: Other: Lidocaine 4% cream 4% Cream Procedures Performed: N/A Debridement N/A Treatment Notes Wound #1 (Left, Proximal, Anterior Lower Leg) Jaime Zuniga, Jaime Zuniga ([id number]) 1. Cleansed with: Clean wound with Normal Saline 2. Anesthetic Topical Lidocaine 4% cream to wound bed prior to debridement 4. Dressing Applied: Prisma Ag 5. Secondary Dressing Applied ABD Pad Dry Gauze Kerlix/Conform 7. Secured with Tape Notes netting Wound #2 (Left, Distal, Anterior Lower Leg) 1. Cleansed with: Clean wound with Normal Saline 2. Anesthetic Topical Lidocaine 4% cream to wound bed prior to debridement 4. Dressing Applied: Prisma Ag 5. Secondary Dressing Applied ABD Pad Dry Gauze Kerlix/Conform 7. Secured with Tape Notes netting Electronic Signature(s) Signed: 02/17/2016 4:15:37 PM By: Alejandro Mulling Entered By: Alejandro Mulling on 02/17/2016 11:03:31 Jaime Zuniga  ([id number]) -------------------------------------------------------------------------------- Multi-Disciplinary Care Plan Details Patient Name: Jaime Zuniga Date of Service: 02/17/2016 9:45 AM Medical Record Number: [id number] Patient Account Number: [id number] Date of Birth/Sex: 1950-11-24 (65 y.o. Male) Treating RN: Phillis Haggis Primary Care Physician: Evelene Croon Other Clinician: Referring Physician: Evelene Croon Treating Physician/Extender: Elayne Snare in Treatment: 0 Active Inactive Abuse / Safety / Falls / Self Care Management Nursing Diagnoses: Potential for falls Goals: Patient will remain injury free Date Initiated: 02/11/2016 Goal Status: Active Interventions: Assess fall risk on admission and as needed Assess self care needs on admission and as needed Notes: Nutrition Nursing Diagnoses: Imbalanced nutrition Goals: Patient/caregiver agrees to and verbalizes understanding of need to use nutritional supplements and/or vitamins as prescribed Date Initiated: 02/11/2016 Goal Status: Active Interventions: Assess patient nutrition upon admission and as needed per policy Notes: Orientation to the Wound Care Program Nursing Diagnoses: Knowledge deficit related to the wound healing center program GoalsJACOLBY, RISBY ([id number]) Patient/caregiver will verbalize understanding of the Wound Healing Center Program Date Initiated: 02/11/2016 Goal Status: Active Interventions: Provide education on orientation to the wound center Notes: Pain, Acute or Chronic Nursing Diagnoses: Pain, acute or chronic: actual or potential Potential alteration in comfort, pain Goals: Patient will verbalize adequate pain control and receive pain control interventions during procedures as needed Date Initiated: 02/11/2016 Goal Status: Active Patient/caregiver will verbalize adequate pain control between visits  Date Initiated: 02/11/2016 Goal Status:  Active Interventions: Assess comfort goal upon admission Complete pain assessment as per visit requirements Notes: Soft Tissue Infection Nursing Diagnoses: Impaired tissue integrity Goals: Patient/caregiver will verbalize understanding of or measures to prevent infection and contamination in the home setting Date Initiated: 02/11/2016 Goal Status: Active Patient's soft tissue infection will resolve Date Initiated: 02/11/2016 Goal Status: Active Interventions: Assess signs and symptoms of infection every visit Jaime Zuniga, Jaime Zuniga ([id number]) Notes: Wound/Skin Impairment Nursing Diagnoses: Impaired tissue integrity Knowledge deficit related to smoking impact on wound healing Goals: Patient will demonstrate a reduced rate of smoking or cessation of smoking Date Initiated: 02/11/2016 Goal Status: Active Ulcer/skin breakdown will have a volume reduction of 30% by week 4 Date Initiated: 02/11/2016 Goal Status: Active Ulcer/skin breakdown will have a volume reduction of 50% by week 8 Date Initiated: 02/11/2016 Goal Status: Active Ulcer/skin breakdown will have a volume reduction of 80% by week 12 Date Initiated: 02/11/2016 Goal Status: Active Interventions: Assess ulceration(s) every visit Notes: Electronic Signature(s) Signed: 02/17/2016 4:15:37 PM By: Alejandro Mulling Entered By: Alejandro Mulling on 02/17/2016 11:03:24 Jaime Zuniga ([id number]) -------------------------------------------------------------------------------- Pain Assessment Details Patient Name: Jaime Zuniga Date of Service: 02/17/2016 9:45 AM Medical Record Number: [id number] Patient Account Number: [id number] Date of Birth/Sex: 1951-06-13 (65 y.o. Male) Treating RN: Phillis Haggis Primary Care Physician: Evelene Croon Other Clinician: Referring Physician: Evelene Croon Treating Physician/Extender: Ardath Sax Weeks in Treatment: 0 Active Problems Location of Pain Severity and Description of Pain Patient  Has Paino No Site Locations With Dressing Change: No Pain Management and Medication Current Pain Management: Notes Topical or injectable lidocaine is offered to patient for acute pain when surgical debridement is performed. If needed, Patient is instructed to use over the counter pain medication for the following 24-48 hours after debridement. Wound care MDs do not prescribed pain medications. Electronic Signature(s) Signed: 02/17/2016 4:15:37 PM By: Alejandro Mulling Entered By: Alejandro Mulling on 02/17/2016 10:07:46 Jaime Zuniga ([id number]) -------------------------------------------------------------------------------- Patient/Caregiver Education Details Patient Name: Jaime Zuniga Date of Service: 02/17/2016 9:45 AM Medical Record Number: [id number] Patient Account Number: [id number] Date of Birth/Gender: 01/20/1951 (65 y.o. Male) Treating RN: Phillis Haggis Primary Care Physician: Evelene Croon Other Clinician: Referring Physician: Evelene Croon Treating Physician/Extender: Elayne Snare in Treatment: 0 Education Assessment Education Provided To: Patient and Caregiver Education Topics Provided Wound/Skin Impairment: Handouts: Other: change dressing as ordered Methods: Demonstration, Explain/Verbal Responses: State content correctly Electronic Signature(s) Signed: 02/17/2016 4:50:06 PM By: Ardath Sax MD Entered By: Ardath Sax on 02/17/2016 10:45:23 Jaime Zuniga ([id number]) -------------------------------------------------------------------------------- Wound Assessment Details Patient Name: Jaime Zuniga Date of Service: 02/17/2016 9:45 AM Medical Record Number: [id number] Patient Account Number: [id number] Date of Birth/Sex: 12/08/1950 (65 y.o. Male) Treating RN: Phillis Haggis Primary Care Physician: Evelene Croon Other Clinician: Referring Physician: Evelene Croon Treating Physician/Extender: Ardath Sax Weeks in Treatment:  0 Wound Status Wound Number: 1 Primary Venous Leg Ulcer Etiology: Wound Location: Left Lower Leg - Anterior, Proximal Wound Open Status: Wounding Event: Gradually Appeared Comorbid Chronic Obstructive Pulmonary Disease Date Acquired: 01/26/2016 History: (COPD), Hypertension, Myocardial Weeks Of Treatment: 0 Infarction, Osteoarthritis, Dementia Clustered Wound: No Photos Photo Uploaded By: Alejandro Mulling on 02/17/2016 11:17:03 Wound Measurements Length: (cm) 1.4 Width: (cm) 0.5 Depth: (cm) 0.3 Area: (cm) 0.55 Volume: (cm) 0.165 % Reduction in Area: 54.5% % Reduction in Volume: 54.5% Epithelialization: None Tunneling: No Wound Description Full Thickness Without Exposed Classification: Support Structures Wound Margin: Distinct, outline attached Exudate Large Amount: Exudate Type: Serosanguineous Exudate Color: red, brown Foul Odor After Cleansing:  No Wound Bed Granulation Amount: Large (67-100%) Exposed Structure Granulation Quality: Red Fascia Exposed: No Jaime Zuniga, Jaime Zuniga ([id number]) Necrotic Amount: Small (1-33%) Fat Layer Exposed: Yes Necrotic Quality: Adherent Slough Tendon Exposed: No Muscle Exposed: No Joint Exposed: No Bone Exposed: No Periwound Skin Texture Texture Color No Abnormalities Noted: No No Abnormalities Noted: No Localized Edema: Yes Erythema: Yes Erythema Location: Circumferential Moisture No Abnormalities Noted: No Temperature / Pain Dry / Scaly: No Temperature: Cool/Cold Maceration: No Tenderness on Palpation: Yes Moist: Yes Wound Preparation Ulcer Cleansing: Rinsed/Irrigated with Saline Topical Anesthetic Applied: Other: lidocaine 4% cream, Treatment Notes Wound #1 (Left, Proximal, Anterior Lower Leg) 1. Cleansed with: Clean wound with Normal Saline 2. Anesthetic Topical Lidocaine 4% cream to wound bed prior to debridement 4. Dressing Applied: Prisma Ag 5. Secondary Dressing Applied ABD Pad Dry  Gauze Kerlix/Conform 7. Secured with Tape Notes netting Electronic Signature(s) Signed: 02/17/2016 4:15:37 PM By: Alejandro MullingPinkerton, Debra Entered By: Alejandro MullingPinkerton, Debra on 02/17/2016 10:18:31 Jaime Zuniga, Jaime Zuniga ([id number]) -------------------------------------------------------------------------------- Wound Assessment Details Patient Name: Jaime Zuniga, Jaime Zuniga Date of Service: 02/17/2016 9:45 AM Medical Record Number: [id number] Patient Account Number: [id number] Date of Birth/Sex: [birth year] 35(65 y.o. Male) Treating RN: Phillis HaggisPinkerton, Debi Primary Care Physician: Evelene CroonNiemeyer, Meindert Other Clinician: Referring Physician: Evelene CroonNiemeyer, Meindert Treating Physician/Extender: Ardath SaxPARKER, PETER Weeks in Treatment: 0 Wound Status Wound Number: 2 Primary Venous Leg Ulcer Etiology: Wound Location: Left Lower Leg - Anterior, Distal Wound Open Status: Wounding Event: Gradually Appeared Comorbid Chronic Obstructive Pulmonary Disease Date Acquired: 01/26/2016 History: (COPD), Hypertension, Myocardial Weeks Of Treatment: 0 Infarction, Osteoarthritis, Dementia Clustered Wound: No Photos Photo Uploaded By: Alejandro MullingPinkerton, Debra on 02/17/2016 11:17:18 Wound Measurements Length: (cm) 1.5 Width: (cm) 0.7 Depth: (cm) 0.2 Area: (cm) 0.825 Volume: (cm) 0.165 % Reduction in Area: 30% % Reduction in Volume: 30.1% Epithelialization: None Tunneling: No Undermining: No Wound Description Full Thickness Without Exposed Classification: Support Structures Wound Margin: Distinct, outline attached Exudate Large Amount: Exudate Type: Serosanguineous Exudate Color: red, brown Foul Odor After Cleansing: No Wound Bed Granulation Amount: Large (67-100%) Exposed Structure Granulation Quality: Red Fascia Exposed: No Jaime Zuniga, Jaime Zuniga ([id number]) Necrotic Amount: Small (1-33%) Fat Layer Exposed: No Necrotic Quality: Adherent Slough Tendon Exposed: No Muscle Exposed: No Joint Exposed: No Bone Exposed: No Periwound Skin  Texture Texture Color No Abnormalities Noted: No No Abnormalities Noted: No Erythema: Yes Moisture Erythema Location: Circumferential No Abnormalities Noted: No Temperature / Pain Temperature: Cool/Cold Tenderness on Palpation: Yes Wound Preparation Ulcer Cleansing: Rinsed/Irrigated with Saline Topical Anesthetic Applied: Other: Lidocaine 4% Cream, Treatment Notes Wound #2 (Left, Distal, Anterior Lower Leg) 1. Cleansed with: Clean wound with Normal Saline 2. Anesthetic Topical Lidocaine 4% cream to wound bed prior to debridement 4. Dressing Applied: Prisma Ag 5. Secondary Dressing Applied ABD Pad Dry Gauze Kerlix/Conform 7. Secured with Tape Notes netting Electronic Signature(s) Signed: 02/17/2016 4:15:37 PM By: Alejandro MullingPinkerton, Debra Entered By: Alejandro MullingPinkerton, Debra on 02/17/2016 10:19:08 Jaime Zuniga, Jaime Zuniga ([id number]) -------------------------------------------------------------------------------- Vitals Details Patient Name: Jaime Zuniga, Jaime Zuniga Date of Service: 02/17/2016 9:45 AM Medical Record Number: [id number] Patient Account Number: [id number] Date of Birth/Sex: [birth year] (65 y.o. Male) Treating RN: Phillis HaggisPinkerton, Debi Primary Care Physician: Evelene CroonNiemeyer, Meindert Other Clinician: Referring Physician: Evelene CroonNiemeyer, Meindert Treating Physician/Extender: Elayne SnarePARKER, PETER Weeks in Treatment: 0 Vital Signs Time Taken: 10:07 Temperature (F): 97.6 Height (in): 72 Pulse (bpm): 71 Weight (lbs): 210 Respiratory Rate (breaths/min): 16 Body Mass Index (BMI): 28.5 Blood Pressure (mmHg): 82/45 Reference Range: 80 - 120 mg / dl Electronic Signature(s) Signed: 02/17/2016 4:15:37 PM By: Alejandro MullingPinkerton, Debra Entered By: Alejandro MullingPinkerton, Debra on 02/17/2016 10:10:45

## 2016-02-17 NOTE — Patient Instructions (Signed)
Medication Instructions: No changes.   Labwork: None.   Procedures/Testing: None.   Follow-Up: As needed with Dr. Akari Crysler.   Any Additional Special Instructions Will Be Listed Below (If Applicable).     If you need a refill on your cardiac medications before your next appointment, please call your pharmacy.   

## 2016-02-17 NOTE — Progress Notes (Signed)
Cardiology Office Note   Date:  02/17/2016   ID:  Jaime Zuniga, DOB 04-22-1951, MRN [id number]  PCP:  Jaime Zuniga, MEINDERT, MD  Cardiologist:   Lorine BearsMuhammad Florabel Faulks, MD   Chief Complaint  Patient presents with  . other    Wound care C/o low BP. Meds reviewed verbally with pt.      History of Present Illness: Jaime Zuniga is a 65 y.o. male who was referred by Dr. Meyer Zuniga at the wound clinic for evaluation of nonhealing ulceration on the left anterior lower leg. This started about 3 months ago after unspecified trauma. The patient is a poor historian overall with known history of Parkinson's. He lives in a group home. He has borderline diabetes and he reports a "mild heart attack" in 2012 . He denies any chest pain or shortness of breath. He has no lower extremity claudication.  He had Venous duplex study in June which was negative for DVT.   Past Medical History  Diagnosis Date  . Hypertension   . Bipolar 1 disorder (HCC)   . GERD (gastroesophageal reflux disease)   . MI (myocardial infarction) [facility](HCC)     Past Surgical History  Procedure Laterality Date  . Knee surgery Left   . Leg surgery       Current Outpatient Prescriptions  Medication Sig Dispense Refill  . amantadine (SYMMETREL) 100 MG capsule Take 100 mg by mouth daily.    . benztropine (COGENTIN) 1 MG tablet Take 1 mg by mouth 3 (three) times daily.    . carvedilol (COREG) 6.25 MG tablet Take 6.25 mg by mouth 2 (two) times daily with a meal.    . furosemide (LASIX) 20 MG tablet Take 20 mg by mouth daily.    Marland Kitchen. gabapentin (NEURONTIN) 100 MG capsule Take 100 mg by mouth 3 (three) times daily.    Marland Kitchen. levothyroxine (SYNTHROID, LEVOTHROID) 25 MCG tablet Take 25 mcg by mouth daily before breakfast.    . lisinopril (PRINIVIL,ZESTRIL) 10 MG tablet Take 10 mg by mouth daily.    Marland Kitchen. neomycin-bacitracin-polymyxin (NEOSPORIN) ointment Apply 1 application topically 2 (two) times daily. 15 g 0  . OLANZapine (ZYPREXA) 10 MG tablet Take 10  mg by mouth at bedtime.    . pantoprazole (PROTONIX) 20 MG tablet Take 20 mg by mouth daily.    . potassium chloride SA (K-DUR,KLOR-CON) 20 MEQ tablet Take 10 mEq by mouth daily.    Marland Kitchen. senna-docusate (SENNA PLUS) 8.6-50 MG tablet Take 1 tablet by mouth 2 (two) times daily.    . traZODone (DESYREL) 50 MG tablet Take 50 mg by mouth at bedtime.     No current facility-administered medications for this visit.    Allergies:   Review of patient's allergies indicates no known allergies.    Social History:  The patient  reports that he has been smoking Cigarettes.  He has been smoking about 0.50 packs per day. He does not have any smokeless tobacco history on file. He reports that he does not drink alcohol or use illicit drugs.   Family History:  The patient's family history includes Heart Problems in his father.    ROS:  Please see the history of present illness.   Otherwise, review of systems are positive for none.   All other systems are reviewed and negative.    PHYSICAL EXAM: VS:  BP 100/64 mmHg  Pulse 71  Ht 6' (1.829 m)  Wt 189 lb 8 oz (85.957 kg)  BMI 25.70 kg/m2 , BMI Body  mass index is 25.7 kg/(m^2). GEN: Well nourished, well developed, in no acute distress HEENT: normal Neck: no JVD, carotid bruits, or masses Cardiac: RRR; no murmurs, rubs, or gallops,no edema  Respiratory:  clear to auscultation bilaterally, normal work of breathing GI: soft, nontender, nondistended, + BS MS: no deformity or atrophy Skin: warm and dry, no rash Neuro:  Strength and sensation are intact Psych: euthymic mood, full affect   EKG:  EKG is ordered today. The ekg ordered today demonstrates normal sinus rhythm with no significant ST or T wave changes    Recent Labs: 01/11/2016: ALT 14*; BUN 14; Creatinine, Ser 0.93; Hemoglobin 12.0*; Platelets 249; Potassium 4.3; Sodium 135    Lipid Panel No results found for: CHOL, TRIG, HDL, CHOLHDL, VLDL, LDLCALC, LDLDIRECT    Wt Readings from Last 3  Encounters:  02/17/16 189 lb 8 oz (85.957 kg)  02/08/16 205 lb (92.987 kg)  01/11/16 200 lb (90.719 kg)       ASSESSMENT AND PLAN:  1.  Nonhealing ulcer on the left anterior lower leg: The patient has swelling in the left leg compared to the right with negative workup for DVT. This is likely a venous ulcer given the location and the lack of significant pain. He underwent lower extremity arterial studies in our office yesterday which showed normal ABI bilaterally and normal great toe pressure. Duplex showed diffuse nonobstructive disease with three-vessel runoff bilaterally. Based on all of this, the patient does not seem to have significant peripheral arterial disease affecting wound healing. No further vascular studies are recommended. He is to continue follow-up at the Wound Care.  2. Essential hypertension: Blood pressure is reasonably controlled.   Disposition:   FU with me as needed  Signed, Lorine Bears, MD  02/17/2016 3:20 PM    [facility]

## 2016-02-21 ENCOUNTER — Ambulatory Visit: Payer: Medicare Other | Admitting: Cardiovascular Disease

## 2016-02-24 ENCOUNTER — Ambulatory Visit: Payer: Medicare Other | Admitting: Surgery

## 2016-02-24 ENCOUNTER — Encounter: Payer: Medicare Other | Admitting: Surgery

## 2016-02-24 DIAGNOSIS — L97222 Non-pressure chronic ulcer of left calf with fat layer exposed: Secondary | ICD-10-CM | POA: Diagnosis not present

## 2016-02-25 NOTE — Progress Notes (Addendum)
NAVARRE, DIANA ([id number]) Visit Report for 02/24/2016 Chief Complaint Document Details Patient Name: DYLYN, MCLAREN Date of Service: 02/24/2016 3:15 PM Medical Record Number: [id number] Patient Account Number: [id number] Date of Birth/Sex: 12-23-1950 (65 y.o. 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 Male) Treating RN: Phillis Haggis Primary Care Physician: Evelene Croon Other Clinician: Referring Physician: Evelene Croon Treating Physician/Extender: Rudene Re in Treatment: 1 History of Present Illness Location: left lower extremity anterior shin area Quality: Patient reports experiencing a dull pain to affected area(s). Severity: Patient states wound are getting worse. Duration: Patient has had the wound for > 3 months prior to seeking treatment at the wound center Timing: Pain in wound is Intermittent (comes and goes Context: The wound appeared gradually over time Modifying Factors: Other treatment(s) tried include:Augmentin and doxycycline Associated Signs and Symptoms: Patient reports having increase swelling. HPI Description: 65 year old gentleman was being seen in the ER recently  for wounds on his left lower extremity which have been draining and there is swelling and he was concerned about wound infection. in the ER x-ray of the left leg was done which showed postsurgical changes but no acute findings.he wrapped his leg in and Unna's boots and referred him to the wound center. earlier in June he had a DVT study done of the left lower extremity which showed no evidence of DVT but there was edema seen. no reflux was seen on that study. in June he was seen by a surgeon Dr. Lemar Livings who advised symptomatic treatment. the patient was also seen at the [facility] he has recently completed courses of Augmentin and doxycycline. 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 02/24/2016 -- seen by Dr. Kirke Corin on 02/17/2016. Lower extremity arterial studies were done which showed a normal ABI bilaterally and normal great toe pressures. Duplex showed diffuse nonobstructive disease and a three-vessel runoff bilaterally. No further vascular studies or intervention was recommended. He was seen by Dr. Romilda Joy on 02/17/2016 and recommended to continue with wound care and no vascular studies intervention was recommended. Electronic Signature(s) Signed: 02/24/2016 3:58:27 PM By: Evlyn Kanner MD, FACS Previous Signature: 02/24/2016 3:46:36 PM Version By: Evlyn Kanner MD, FACS Entered By: Evlyn Kanner on 02/24/2016 15:58:27 KADARIOUS, DIKES ([id number]) -------------------------------------------------------------------------------- Physical Exam Details Patient Name: [name] Date of Service: 02/24/2016 3:15 PM Medical Record Number: [id number] Patient Account Number: [id number] Date of Birth/Sex: May 11, 1951 (65 y.o. Male) Treating RN: Phillis Haggis Primary Care Physician: Evelene Croon Other Clinician: Referring Physician: Evelene Croon Treating  Physician/Extender: Rudene Re in Treatment: 1 Constitutional . Pulse regular. Respirations normal and unlabored. Afebrile. . Eyes Nonicteric. Reactive to light. Ears, Nose, Mouth, and Throat Lips, teeth, and gums WNL.Marland Kitchen Moist mucosa without lesions. Neck supple and nontender. No palpable supraclavicular or cervical adenopathy. Normal sized without goiter. Respiratory WNL. No  retractions.. Cardiovascular Pedal Pulses WNL. No clubbing, cyanosis or edema. Lymphatic No adneopathy. No adenopathy. No adenopathy. Musculoskeletal Adexa without tenderness or enlargement.. Digits and nails w/o clubbing, cyanosis, infection, petechiae, ischemia, or inflammatory conditions.. Integumentary (Hair, Skin) No suspicious lesions. No crepitus or fluctuance. No peri-wound warmth or erythema. No masses.Marland Kitchen Psychiatric Judgement and insight Intact.. No evidence of depression, anxiety, or agitation.. Notes the wounds on the left anterior shin look fairly clean and had some debris which I washed off with moist saline gauze and there is healthy granulation tissue. Electronic Signature(s) Signed: 02/24/2016 3:58:55 PM By: Evlyn Kanner MD, FACS Entered By: Evlyn Kanner on 02/24/2016 15:58:55 [name] ([id number]) -------------------------------------------------------------------------------- Physician Orders Details Patient Name: [name] Date of Service: 02/24/2016 3:15 PM Medical Record Number: [id number] Patient Account Number: [id number] Date of Birth/Sex: 02/20/1951 (65 y.o. Male) Treating RN: Phillis Haggis Primary Care Physician: Evelene Croon Other Clinician: Referring Physician: Evelene Croon Treating Physician/Extender: Rudene Re in Treatment: 1 Verbal / Phone Orders: Yes Clinician: Pinkerton, Debi Read Back and Verified: Yes Diagnosis Coding Wound Cleansing Wound #1 Left,Proximal,Anterior Lower Leg o Clean wound with Normal Saline. o Cleanse  wound with mild soap and water o May Shower, gently pat wound dry prior to applying new dressing. Wound #2 Left,Distal,Anterior Lower Leg o Clean wound with Normal Saline. o Cleanse wound with mild soap and water o May Shower, gently pat wound dry prior to applying new dressing. Anesthetic Wound #1 Left,Proximal,Anterior Lower Leg o Topical Lidocaine 4% cream applied to wound bed prior to debridement - for clinic use Wound #2 Left,Distal,Anterior Lower Leg o Topical Lidocaine 4% cream applied to wound bed prior to debridement - for clinic use Skin Barriers/Peri-Wound Care Wound #1 Left,Proximal,Anterior Lower Leg o Barrier cream Wound #2 Left,Distal,Anterior Lower Leg o Barrier cream Primary Wound Dressing Wound #1 Left,Proximal,Anterior Lower Leg o Prisma Ag - please moisten with saline Wound #2 Left,Distal,Anterior Lower Leg o Prisma Ag - please moisten with saline Secondary Dressing Wound #1 Left,Proximal,Anterior Lower Leg o ABD pad o Dry Gauze - netting Dhillon, Bryndan ([id number]) Wound #2 Left,Distal,Anterior Lower Leg o ABD pad o Dry Gauze - netting Dressing Change Frequency Wound #1 Left,Proximal,Anterior Lower Leg o Change dressing every other day. Wound #2 Left,Distal,Anterior Lower Leg o Change dressing every other day. Follow-up Appointments Wound #1 Left,Proximal,Anterior Lower Leg o Return Appointment in 1 week. Wound #2 Left,Distal,Anterior Lower Leg o Return Appointment in 1 week. Edema Control Wound #1 Left,Proximal,Anterior Lower Leg o Elevate legs to the level of the heart and pump ankles as often as possible Wound #2 Left,Distal,Anterior Lower Leg o Elevate legs to the level of the heart and pump ankles as often as possible Additional Orders / Instructions Wound #1 Left,Proximal,Anterior Lower Leg o Stop Smoking o Increase protein intake. Wound #2 Left,Distal,Anterior Lower Leg o Stop Smoking o  Increase protein intake. Home Health Wound #1 Left,Proximal,Anterior Lower Leg o Continue Home Health Visits - [facility] o Home Health Nurse may visit PRN to address patientos wound care needs. o FACE TO FACE ENCOUNTER: MEDICARE and MEDICAID PATIENTS: I certify that this patient is under my care and that I had a face-to-face encounter that meets the physician face-to-face encounter requirements with this patient on this date. The encounter with the patient was in whole or in part for the following MEDICAL CONDITION: (primary reason for Home Healthcare) MEDICAL NECESSITY: I certify, that based on my findings, NURSING services are a medically necessary home health service. HOME BOUND STATUS: I certify that  my clinical findings support that this patient is homebound (i.e., Due to illness or injury, pt requires aid of supportive devices such as crutches, cane, wheelchairs, walkers, the use of special [place], [place] ([id number]) transportation or the assistance of another person to leave their place of residence. There is a normal inability to leave the home and doing so requires considerable and taxing effort. Other absences are for medical reasons / religious services and are infrequent or of short duration when for other reasons). o If current dressing causes regression in wound condition, may D/C ordered dressing product/s and apply Normal Saline Moist Dressing daily until next Wound Healing Center / Other MD appointment. Notify Wound Healing Center of regression in wound condition at [phone]. o Please direct any NON-WOUND related issues/requests for orders to patient's Primary Care Physician Wound #2 Left,Distal,Anterior Lower Leg o Continue Home Health Visits - [facility] o Home Health Nurse may visit PRN to address patientos wound care needs. o FACE TO FACE ENCOUNTER: MEDICARE and MEDICAID PATIENTS: I certify that this patient is under my care and that I had a  face-to-face encounter that meets the physician face-to-face encounter requirements with this patient on this date. The encounter with the patient was in whole or in part for the following MEDICAL CONDITION: (primary reason for Home Healthcare) MEDICAL NECESSITY: I certify, that based on my findings, NURSING services are a medically necessary home health service. HOME BOUND STATUS: I certify that my clinical findings support that this patient is homebound (i.e., Due to illness or injury, pt requires aid of supportive devices such as crutches, cane, wheelchairs, walkers, the use of special transportation or the assistance of another person to leave their place of residence. There is a normal inability to leave the home and doing so requires considerable and taxing effort. Other absences are for medical reasons / religious services and are infrequent or of short duration when for other reasons). o If current dressing causes regression in wound condition, may D/C ordered dressing product/s and apply Normal Saline Moist Dressing daily until next Wound Healing Center / Other MD appointment. Notify Wound Healing Center of regression in wound condition at [phone]. o Please direct any NON-WOUND related issues/requests for orders to patient's Primary Care Physician Medications-please add to medication list. Wound #1 Left,Proximal,Anterior Lower Leg o Other: - vitamin C, zinc, multivitamin Wound #2 Left,Distal,Anterior Lower Leg o Other: - vitamin C, zinc, multivitamin Electronic Signature(s) Signed: 02/24/2016 4:28:14 PM By: Evlyn Kanner MD, FACS Signed: 02/24/2016 5:50:44 PM By: Alejandro Mulling Entered By: Alejandro Mulling on 02/24/2016 15:56:22 [name] ([id number]) -------------------------------------------------------------------------------- Problem List Details Patient Name: [name] Date of Service: 02/24/2016 3:15 PM Medical Record Number: [id number] Patient Account  Number: [id number] Date of Birth/Sex: 08-09-1950 (65 y.o. 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 Severity: Patient states wound are getting worse. Duration: Patient has had the wound for > 3 months prior to seeking treatment at the wound center Timing: Pain in wound is Intermittent (comes and goes Context: The wound appeared gradually over  time Modifying Factors: Other treatment(s) tried include:Augmentin and doxycycline Associated Signs and Symptoms: Patient reports having increase swelling. 65 year old gentleman was being seen in the ER recently for wounds on his left lower extremity which have been draining and there is swelling and he was concerned about wound infection. in the ER x-ray of the left leg was done which showed postsurgical changes but no acute findings.he wrapped his leg in and Unna's boots and referred him to the wound center. earlier in June he had a DVT study done of the left lower extremity which showed no evidence of DVT but there was edema seen. no reflux was seen on that study. in June he was seen by a surgeon Dr. Lemar Livings who advised symptomatic treatment. the patient was also seen at the [facility] he has recently completed courses of Augmentin and doxycycline. 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 02/24/2016 -- seen by Dr. Kirke Corin on 02/17/2016. Lower extremity arterial studies were done which showed a normal ABI bilaterally and normal great toe pressures. Duplex showed diffuse nonobstructive disease and a three-vessel runoff bilaterally. No further vascular studies or intervention was recommended. He was seen by Dr. Romilda Joy on 02/17/2016 and recommended to continue with wound care and no vascular studies intervention was recommended. [place], Irene Limbo ([id number]) Objective Constitutional Pulse regular. Respirations normal and unlabored. Afebrile. Vitals Time Taken: 3:39 PM, Height: 72 in, Weight: 210 lbs, BMI: 28.5, Temperature: 97.6 F, Pulse: 71 bpm, Respiratory Rate: 16 breaths/min, Blood Pressure: 100/56 mmHg. Eyes Nonicteric. Reactive to light. Ears, Nose, Mouth, and Throat Lips, teeth, and gums WNL.Marland Kitchen Moist mucosa without lesions. Neck supple and nontender. No  palpable supraclavicular or cervical adenopathy. Normal sized without goiter. Respiratory WNL. No retractions.. Cardiovascular Pedal Pulses WNL. No clubbing, cyanosis or edema. Lymphatic No adneopathy. No adenopathy. No adenopathy. Musculoskeletal Adexa without tenderness or enlargement.. Digits and nails w/o clubbing, cyanosis, infection, petechiae, ischemia, or inflammatory conditions.Marland Kitchen Psychiatric Judgement and insight Intact.. No evidence of depression, anxiety, or agitation.. General Notes: the wounds on the left anterior shin look fairly clean and had some debris which I washed off with moist saline gauze and there is healthy granulation tissue. Integumentary (Hair, Skin) No suspicious lesions. No crepitus or fluctuance. No peri-wound warmth or erythema. No masses.. Wound #1 status is Open. Original cause of wound was Gradually Appeared. The wound is located on the Left,Proximal,Anterior Lower Leg. The wound measures 1.8cm length x 0.4cm width x 0.2cm depth; 0.565cm^2 area and 0.113cm^3 volume. There is fat exposed. There is no tunneling or undermining noted. There is a large amount of serosanguineous drainage noted. The wound margin is distinct with the outline attached to the wound base. There is  large (67-100%) red granulation within the wound bed. There is a small (1-33%) amount of necrotic tissue within the wound bed including Adherent Slough. The periwound skin appearance exhibited: Localized Edema, Moist, Erythema. The periwound skin appearance did not exhibit: Dry/Scaly, Maceration. The surrounding wound skin color is noted with erythema which is Beauchamp, [place] ([id number]) circumferential. Periwound temperature was noted as Cool/Cold. The periwound has tenderness on palpation. Wound #2 status is Open. Original cause of wound was Gradually Appeared. The wound is located on the [facility] Lower Leg. The wound measures 1.5cm length x 0.7cm width x 0.2cm  depth; 0.825cm^2 area and 0.165cm^3 volume. There is no tunneling or undermining noted. There is a large amount of serosanguineous drainage noted. The wound margin is distinct with the outline attached to the wound base. There is large (67-100%) red granulation within the wound bed. There is a small (1-33%) amount of necrotic tissue within the wound bed including Adherent Slough. The periwound skin appearance exhibited: Erythema. The surrounding wound skin color is noted with erythema which is circumferential. Periwound temperature was noted as Cool/Cold. The periwound has tenderness on palpation. 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 Prisma Ag to the wounds and lightly covered with a gauze and Kerlix dressing to be changed daily 2. Arterial duplex study to be performed and these reports have been reviewed with him 3. He says has stopped smoking since we spoke to him last 4. regular visits to the wound center Plan Wound Cleansing: Wound #1 Left,Proximal,Anterior Lower Leg: Clean wound with Normal Saline. Cleanse wound with mild soap and water May Shower, gently pat wound dry prior to applying new dressing. Wound #2 Left,Distal,Anterior Lower Leg: Clean wound with Normal Saline. Cleanse wound with mild soap and water May Shower, gently pat wound dry prior to applying new dressing. Anesthetic: Wound #1 Left,Proximal,Anterior Lower Leg: Topical Lidocaine 4% cream applied to wound bed prior to debridement - for clinic use ZACKARIAH, VANDERPOL ([id number]) Wound #2 Left,Distal,Anterior Lower Leg: Topical Lidocaine 4% cream applied to wound bed prior to debridement - for clinic use Skin Barriers/Peri-Wound Care: Wound #1 Left,Proximal,Anterior Lower Leg: Barrier cream Wound #2 Left,Distal,Anterior  Lower Leg: Barrier cream Primary Wound Dressing: Wound #1 Left,Proximal,Anterior Lower Leg: Prisma Ag - please moisten with saline Wound #2 Left,Distal,Anterior Lower Leg: Prisma Ag - please moisten with saline Secondary Dressing: Wound #1 Left,Proximal,Anterior Lower Leg: ABD pad Dry Gauze - netting Wound #2 Left,Distal,Anterior Lower Leg: ABD pad Dry Gauze - netting Dressing Change Frequency: Wound #1 Left,Proximal,Anterior Lower Leg: Change dressing every other day. Wound #2 Left,Distal,Anterior Lower Leg: Change dressing every other day. Follow-up Appointments: Wound #1 Left,Proximal,Anterior Lower Leg: Return Appointment in 1 week. Wound #2 Left,Distal,Anterior Lower Leg: Return Appointment in 1 week. Edema Control: Wound #1 Left,Proximal,Anterior Lower Leg: Elevate legs to the level of the heart and pump ankles as often as possible Wound #2 Left,Distal,Anterior Lower Leg: Elevate legs to the level of the heart and pump ankles as often as possible Additional Orders / Instructions: Wound #1 Left,Proximal,Anterior Lower Leg: Stop Smoking Increase protein intake. Wound #2 Left,Distal,Anterior Lower Leg: Stop Smoking Increase protein intake. Home Health: Wound #1 Left,Proximal,Anterior Lower Leg: Continue Home Health Visits - [facility] Nurse may visit PRN to address patient s wound care needs. FACE TO FACE ENCOUNTER: MEDICARE and MEDICAID PATIENTS: I certify that this  patient is under my care and that I had a face-to-face encounter that meets the physician face-to-face encounter requirements with this patient on this date. The encounter with the patient was in whole or in part for the following MEDICAL CONDITION: (primary reason for Home Healthcare) MEDICAL NECESSITY: I certify, that based on my findings, NURSING services are a medically necessary home health service. HOME BOUND STATUS: I certify that my clinical findings support that this patient is homebound  (i.e., Due to [place], [place] ([id number]) illness or injury, pt requires aid of supportive devices such as crutches, cane, wheelchairs, walkers, the use of special transportation or the assistance of another person to leave their place of residence. There is a normal inability to leave the home and doing so requires considerable and taxing effort. Other absences are for medical reasons / religious services and are infrequent or of short duration when for other reasons). If current dressing causes regression in wound condition, may D/C ordered dressing product/s and apply Normal Saline Moist Dressing daily until next Wound Healing Center / Other MD appointment. Notify Wound Healing Center of regression in wound condition at [phone]. Please direct any NON-WOUND related issues/requests for orders to patient's Primary Care Physician Wound #2 Left,Distal,Anterior Lower Leg: Continue Home Health Visits - [facility] Nurse may visit PRN to address patient s wound care needs. FACE TO FACE ENCOUNTER: MEDICARE and MEDICAID PATIENTS: I certify that this patient is under my care and that I had a face-to-face encounter that meets the physician face-to-face encounter requirements with this patient on this date. The encounter with the patient was in whole or in part for the following MEDICAL CONDITION: (primary reason for Home Healthcare) MEDICAL NECESSITY: I certify, that based on my findings, NURSING services are a medically necessary home health service. HOME BOUND STATUS: I certify that my clinical findings support that this patient is homebound (i.e., Due to illness or injury, pt requires aid of supportive devices such as crutches, cane, wheelchairs, walkers, the use of special transportation or the assistance of another person to leave their place of residence. There is a normal inability to leave the home and doing so requires considerable and taxing effort. Other absences are for medical  reasons / religious services and are infrequent or of short duration when for other reasons). If current dressing causes regression in wound condition, may D/C ordered dressing product/s and apply Normal Saline Moist Dressing daily until next Wound Healing Center / Other MD appointment. Notify Wound Healing Center of regression in wound condition at [phone]. Please direct any NON-WOUND related issues/requests for orders to patient's Primary Care Physician Medications-please add to medication list.: Wound #1 Left,Proximal,Anterior Lower Leg: Other: - vitamin C, zinc, multivitamin Wound #2 Left,Distal,Anterior Lower Leg: Other: - vitamin C, zinc, multivitamin I have recommended: 1. Prisma Ag to the wounds and lightly covered with a gauze and Kerlix dressing to be changed daily 2. Arterial duplex study to be performed and these reports have been reviewed with him 3. He says has stopped smoking since we spoke to him last 4. regular visits to the wound center Electronic Signature(s) Signed: 02/24/2016 4:01:06 PM By: Evlyn Kanner MD, FACS Entered By: Evlyn Kanner on 02/24/2016 16:01:06 [name] ([id number]) -------------------------------------------------------------------------------- SuperBill Details Patient Name: [name] Date of Service: 02/24/2016 Medical Record Number: [id number] Patient Account Number: [id number] Date of Birth/Sex: 1951-07-16 (65 y.o. 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 M61.462 Other calcification of muscle, left lower leg I73.9 Peripheral vascular disease, unspecified Facility Procedures CPT4 Code: [id number] Description: 99214 - WOUND CARE VISIT-LEV 4 EST  PT Modifier: Quantity: 1 Physician Procedures CPT4 Code: [id number] Description: 99213 - WC PHYS LEVEL 3 - EST PT ICD-10 Description Diagnosis L97.222 Non-pressure chronic ulcer of left calf with fat M61.462 Other calcification of muscle, left lower leg I73.9 Peripheral vascular disease, unspecified Modifier: layer exposed Quantity: 1 Electronic Signature(s) Signed: 02/24/2016 4:28:14 PM By: Evlyn KannerBritto, Donovan Persley MD, FACS Signed: 02/24/2016 5:50:44 PM By: Alejandro MullingPinkerton, Debra Previous Signature: 02/24/2016 4:01:21 PM Version By: Evlyn KannerBritto, Mykelti Goldenstein MD, FACS Entered By: Alejandro MullingPinkerton, Debra on 02/24/2016 16:23:56

## 2016-02-25 NOTE — Progress Notes (Signed)
GATLYN, LIPARI ([id number]) Visit Report for 02/24/2016 Arrival Information Details Patient Name: Jaime Zuniga, Jaime Zuniga Date of Service: 02/24/2016 3:15 PM Medical Record Number: [id number] Patient Account Number: [id number] Date of Birth/Sex: 1950-11-23 (65 y.o. Male) Treating RN: Phillis Haggis Primary Care Physician: Evelene Croon Other Clinician: Referring Physician: Evelene Croon Treating Physician/Extender: Rudene Re in Treatment: 1 Visit Information History Since Last Visit All ordered tests and consults were completed: No Patient Arrived: Dan Humphreys Added or deleted any medications: No Arrival Time: 15:37 Any new allergies or adverse reactions: No Accompanied By: CAREGIVER Had a fall or experienced change in No Transfer Assistance: None activities of daily living that may affect Patient Identification Verified: Yes risk of falls: Secondary Verification Process Yes Signs or symptoms of abuse/neglect since last No Completed: visito Patient Requires Transmission-Based No Hospitalized since last visit: No Precautions: Pain Present Now: No Patient Has Alerts: No Electronic Signature(s) Signed: 02/24/2016 5:50:44 PM By: Alejandro Mulling Entered By: Alejandro Mulling on 02/24/2016 15:39:17 Jaime Zuniga ([id number]) -------------------------------------------------------------------------------- Clinic Level of Care Assessment Details Patient Name: Jaime Zuniga Date of Service: 02/24/2016 3:15 PM Medical Record Number: [id number] Patient Account Number: [id number] Date of Birth/Sex: [birth year] (65 y.o. Male) Treating RN: Phillis Haggis Primary Care Physician: Evelene Croon Other Clinician: Referring Physician: Evelene Croon Treating Physician/Extender: Rudene Re in Treatment: 1 Clinic Level of Care Assessment Items TOOL 4 Quantity Score X - Use when only an EandM is performed on FOLLOW-UP visit 1 0 ASSESSMENTS - Nursing Assessment /  Reassessment X - Reassessment of Co-morbidities (includes updates in patient status) 1 10 X - Reassessment of Adherence to Treatment Plan 1 5 ASSESSMENTS - Wound and Skin Assessment / Reassessment []  - Simple Wound Assessment / Reassessment - one wound 0 X - Complex Wound Assessment / Reassessment - multiple wounds 2 5 []  - Dermatologic / Skin Assessment (not related to wound area) 0 ASSESSMENTS - Focused Assessment []  - Circumferential Edema Measurements - multi extremities 0 []  - Nutritional Assessment / Counseling / Intervention 0 []  - Lower Extremity Assessment (monofilament, tuning fork, pulses) 0 []  - Peripheral Arterial Disease Assessment (using hand held doppler) 0 ASSESSMENTS - Ostomy and/or Continence Assessment and Care []  - Incontinence Assessment and Management 0 []  - Ostomy Care Assessment and Management (repouching, etc.) 0 PROCESS - Coordination of Care []  - Simple Patient / Family Education for ongoing care 0 X - Complex (extensive) Patient / Family Education for ongoing care 1 20 X - Staff obtains Chiropractor, Records, Test Results / Process Orders 1 10 X - Staff telephones HHA, Nursing Homes / Clarify orders / etc 1 10 []  - Routine Transfer to another Facility (non-emergent condition) 0 Hitchner, Connor ([id number]) []  - Routine Hospital Admission (non-emergent condition) 0 []  - New Admissions / Manufacturing engineer / Ordering NPWT, Apligraf, etc. 0 []  - Emergency Hospital Admission (emergent condition) 0 X - Simple Discharge Coordination 1 10 []  - Complex (extensive) Discharge Coordination 0 PROCESS - Special Needs []  - Pediatric / Minor Patient Management 0 []  - Isolation Patient Management 0 []  - Hearing / Language / Visual special needs 0 []  - Assessment of Community assistance (transportation, D/C planning, etc.) 0 []  - Additional assistance / Altered mentation 0 []  - Support Surface(s) Assessment (bed, cushion, seat, etc.) 0 INTERVENTIONS - Wound Cleansing /  Measurement []  - Simple Wound Cleansing - one wound 0 X - Complex Wound Cleansing - multiple wounds 2 5 []  - Wound Imaging (photographs - any number of wounds) 0 []  - Wound Tracing (instead  of photographs) 0 []  - Simple Wound Measurement - one wound 0 X - Complex Wound Measurement - multiple wounds 2 5 INTERVENTIONS - Wound Dressings []  - Small Wound Dressing one or multiple wounds 0 X - Medium Wound Dressing one or multiple wounds 1 15 []  - Large Wound Dressing one or multiple wounds 0 X - Application of Medications - topical 1 5 []  - Application of Medications - injection 0 INTERVENTIONS - Miscellaneous []  - External ear exam 0 Jaime Zuniga, Jaime Zuniga ([id number]) []  - Specimen Collection (cultures, biopsies, blood, body fluids, etc.) 0 []  - Specimen(s) / Culture(s) sent or taken to Lab for analysis 0 []  - Patient Transfer (multiple staff / Michiel SitesHoyer Lift / Similar devices) 0 []  - Simple Staple / Suture removal (25 or less) 0 []  - Complex Staple / Suture removal (26 or more) 0 []  - Hypo / Hyperglycemic Management (close monitor of Blood Glucose) 0 []  - Ankle / Brachial Index (ABI) - do not check if billed separately 0 X - Vital Signs 1 5 Has the patient been seen at the hospital within the last three years: Yes Total Score: 120 Level Of Care: New/Established - Level 4 Electronic Signature(s) Signed: 02/24/2016 5:50:44 PM By: Alejandro MullingPinkerton, Debra Entered By: Alejandro MullingPinkerton, Debra on 02/24/2016 16:22:28 Jaime MaclachlanBURTON, Jaime Zuniga ([id number]) -------------------------------------------------------------------------------- Encounter Discharge Information Details Patient Name: Jaime MaclachlanBURTON, Jaime Zuniga Date of Service: 02/24/2016 3:15 PM Medical Record Number: [id number] Patient Account Number: [id number] Date of Birth/Sex: [birth year](65 y.o. Male) Treating RN: Phillis HaggisPinkerton, Debi Primary Care Physician: Evelene CroonNiemeyer, Meindert Other Clinician: Referring Physician: Evelene CroonNiemeyer, Meindert Treating Physician/Extender: Rudene ReBritto, Errol Weeks in  Treatment: 1 Encounter Discharge Information Items Discharge Pain Level: 0 Discharge Condition: Stable Ambulatory Status: Walker Discharge Destination: Nursing Home Transportation: Other Accompanied By: CAREGIVER Schedule Follow-up Appointment: Yes Medication Reconciliation completed and provided to Patient/Care Yes Burns Timson: Provided on Clinical Summary of Care: 02/24/2016 Form Type Recipient Paper Patient SB Electronic Signature(s) Signed: 02/24/2016 4:06:17 PM By: Gwenlyn PerkingMoore, Shelia Entered By: Gwenlyn PerkingMoore, Shelia on 02/24/2016 16:06:17 Jaime MaclachlanBURTON, Jaime Zuniga ([id number]) -------------------------------------------------------------------------------- Lower Extremity Assessment Details Patient Name: Jaime MaclachlanBURTON, Jaime Zuniga Date of Service: 02/24/2016 3:15 PM Medical Record Number: [id number] Patient Account Number: [id number] Date of Birth/Sex: [birth year] (65 y.o. Male) Treating RN: Phillis HaggisPinkerton, Debi Primary Care Physician: Evelene CroonNiemeyer, Meindert Other Clinician: Referring Physician: Evelene CroonNiemeyer, Meindert Treating Physician/Extender: Rudene ReBritto, Errol Weeks in Treatment: 1 Vascular Assessment Pulses: Posterior Tibial Dorsalis Pedis Palpable: [Left:No] Doppler: [Left:Monophasic] Extremity colors, hair growth, and conditions: Extremity Color: [Left:Hyperpigmented] Temperature of Extremity: [Left:Warm] Capillary Refill: [Left:> 3 seconds] Electronic Signature(s) Signed: 02/24/2016 5:50:44 PM By: Alejandro MullingPinkerton, Debra Entered By: Alejandro MullingPinkerton, Debra on 02/24/2016 15:43:52 Jaime Zuniga, Jaime LimboSAMMY ([id number]) -------------------------------------------------------------------------------- Multi Wound Chart Details Patient Name: Jaime MaclachlanBURTON, Jaime Zuniga Date of Service: 02/24/2016 3:15 PM Medical Record Number: [id number] Patient Account Number: [id number] Date of Birth/Sex: [birth year] (65 y.o. Male) Treating RN: Phillis HaggisPinkerton, Debi Primary Care Physician: Evelene CroonNiemeyer, Meindert Other Clinician: Referring Physician: Evelene CroonNiemeyer, Meindert Treating  Physician/Extender: Rudene ReBritto, Errol Weeks in Treatment: 1 Vital Signs Height(in): 72 Pulse(bpm): 71 Weight(lbs): 210 Blood Pressure 100/56 (mmHg): Body Mass Index(BMI): 28 Temperature(F): 97.6 Respiratory Rate 16 (breaths/min): Photos: [1:No Photos] [2:No Photos] [N/A:N/A] Wound Location: [1:Left Lower Leg - Anterior, Proximal] [2:Left Lower Leg - Anterior, Distal] [N/A:N/A] Wounding Event: [1:Gradually Appeared] [2:Gradually Appeared] [N/A:N/A] Primary Etiology: [1:Venous Leg Ulcer] [2:Venous Leg Ulcer] [N/A:N/A] Comorbid History: [1:Chronic Obstructive Pulmonary Disease (COPD), Hypertension, Myocardial Infarction, Osteoarthritis, Dementia] [2:Chronic Obstructive Pulmonary Disease (COPD), Hypertension, Myocardial Infarction, Osteoarthritis, Dementia] [N/A:N/A] Date Acquired: [1:01/26/2016] [2:01/26/2016] [N/A:N/A] Weeks of Treatment: [1:1] [2:1] [N/A:N/A] Wound Status: [1:Open] [2:Open] [N/A:N/A] Measurements L x W  x D 1.8x0.4x0.2 [2:1.5x0.7x0.2] [N/A:N/A] (cm) Area (cm) : [1:0.565] [2:0.825] [N/A:N/A] Volume (cm) : [1:0.113] [2:0.165] [N/A:N/A] % Reduction in Area: [1:53.30%] [2:30.00%] [N/A:N/A] % Reduction in Volume: 68.90% [2:30.10%] [N/A:N/A] Classification: [1:Full Thickness Without Exposed Support Structures] [2:Full Thickness Without Exposed Support Structures] [N/A:N/A] Exudate Amount: [1:Large] [2:Large] [N/A:N/A] Exudate Type: [1:Serosanguineous] [2:Serosanguineous] [N/A:N/A] Exudate Color: [1:red, brown] [2:red, brown] [N/A:N/A] Wound Margin: [1:Distinct, outline attached] [2:Distinct, outline attached] [N/A:N/A] Granulation Amount: [1:Large (67-100%)] [2:Large (67-100%)] [N/A:N/A] Granulation Quality: [1:Red] [2:Red] [N/A:N/A] Necrotic Amount: [1:Small (1-33%)] [2:Small (1-33%)] [N/A:N/A] Exposed Structures: [N/A:N/A] Fat: Yes Fascia: No Fascia: No Fat: No Tendon: No Tendon: No Muscle: No Muscle: No Joint: No Joint: No Bone: No Bone:  No Epithelialization: None None N/A Periwound Skin Texture: Edema: Yes No Abnormalities Noted N/A Periwound Skin Moist: Yes No Abnormalities Noted N/A Moisture: Maceration: No Dry/Scaly: No Periwound Skin Color: Erythema: Yes Erythema: Yes N/A Erythema Location: Circumferential Circumferential N/A Temperature: Cool/Cold Cool/Cold N/A Tenderness on Yes Yes N/A Palpation: Wound Preparation: Ulcer Cleansing: Ulcer Cleansing: N/A Rinsed/Irrigated with Rinsed/Irrigated with Saline Saline Topical Anesthetic Topical Anesthetic Applied: Other: lidocaine Applied: Other: Lidocaine 4% cream 4% Cream Treatment Notes Electronic Signature(s) Signed: 02/24/2016 5:50:44 PM By: Alejandro Mulling Entered By: Alejandro Mulling on 02/24/2016 15:50:59 Jaime Zuniga ([id number]) -------------------------------------------------------------------------------- Multi-Disciplinary Care Plan Details Patient Name: Jaime Zuniga Date of Service: 02/24/2016 3:15 PM Medical Record Number: [id number] Patient Account Number: [id number] Date of Birth/Sex: 05-Sep-1950 (65 y.o. Male) Treating RN: Phillis Haggis Primary Care Physician: Evelene Croon Other Clinician: Referring Physician: Evelene Croon Treating Physician/Extender: Rudene Re in Treatment: 1 Active Inactive Abuse / Safety / Falls / Self Care Management Nursing Diagnoses: Potential for falls Goals: Patient will remain injury free Date Initiated: 02/11/2016 Goal Status: Active Interventions: Assess fall risk on admission and as needed Assess self care needs on admission and as needed Notes: Nutrition Nursing Diagnoses: Imbalanced nutrition Goals: Patient/caregiver agrees to and verbalizes understanding of need to use nutritional supplements and/or vitamins as prescribed Date Initiated: 02/11/2016 Goal Status: Active Interventions: Assess patient nutrition upon admission and as needed per policy Notes: Orientation to the  Wound Care Program Nursing Diagnoses: Knowledge deficit related to the wound healing center program GoalsMADDYX, Jaime Zuniga ([id number]) Patient/caregiver will verbalize understanding of the Wound Healing Center Program Date Initiated: 02/11/2016 Goal Status: Active Interventions: Provide education on orientation to the wound center Notes: Pain, Acute or Chronic Nursing Diagnoses: Pain, acute or chronic: actual or potential Potential alteration in comfort, pain Goals: Patient will verbalize adequate pain control and receive pain control interventions during procedures as needed Date Initiated: 02/11/2016 Goal Status: Active Patient/caregiver will verbalize adequate pain control between visits Date Initiated: 02/11/2016 Goal Status: Active Interventions: Assess comfort goal upon admission Complete pain assessment as per visit requirements Notes: Soft Tissue Infection Nursing Diagnoses: Impaired tissue integrity Goals: Patient/caregiver will verbalize understanding of or measures to prevent infection and contamination in the home setting Date Initiated: 02/11/2016 Goal Status: Active Patient's soft tissue infection will resolve Date Initiated: 02/11/2016 Goal Status: Active Interventions: Assess signs and symptoms of infection every visit Jaime Zuniga, Jaime Zuniga ([id number]) Notes: Wound/Skin Impairment Nursing Diagnoses: Impaired tissue integrity Knowledge deficit related to smoking impact on wound healing Goals: Patient will demonstrate a reduced rate of smoking or cessation of smoking Date Initiated: 02/11/2016 Goal Status: Active Ulcer/skin breakdown will have a volume reduction of 30% by week 4 Date Initiated: 02/11/2016 Goal Status: Active Ulcer/skin breakdown will have a volume reduction of 50% by week 8 Date Initiated: 02/11/2016 Goal Status: Active Ulcer/skin  breakdown will have a volume reduction of 80% by week 12 Date Initiated: 02/11/2016 Goal Status: Active Interventions: Assess  ulceration(s) every visit Notes: Electronic Signature(s) Signed: 02/24/2016 5:50:44 PM By: Alejandro Mulling Entered By: Alejandro Mulling on 02/24/2016 15:50:53 Jaime Zuniga ([id number]) -------------------------------------------------------------------------------- Pain Assessment Details Patient Name: Jaime Zuniga Date of Service: 02/24/2016 3:15 PM Medical Record Number: [id number] Patient Account Number: [id number] Date of Birth/Sex: 04-Jul-1951 (65 y.o. Male) Treating RN: Phillis Haggis Primary Care Physician: Evelene Croon Other Clinician: Referring Physician: Evelene Croon Treating Physician/Extender: Rudene Re in Treatment: 1 Active Problems Location of Pain Severity and Description of Pain Patient Has Paino No Site Locations With Dressing Change: No Pain Management and Medication Current Pain Management: Electronic Signature(s) Signed: 02/24/2016 5:50:44 PM By: Alejandro Mulling Entered By: Alejandro Mulling on 02/24/2016 15:39:23 Jaime Zuniga ([id number]) -------------------------------------------------------------------------------- Patient/Caregiver Education Details Patient Name: Jaime Zuniga Date of Service: 02/24/2016 3:15 PM Medical Record Number: [id number] Patient Account Number: [id number] Date of Birth/Gender: 1951-02-22 (65 y.o. Male) Treating RN: Phillis Haggis Primary Care Physician: Evelene Croon Other Clinician: Referring Physician: Evelene Croon Treating Physician/Extender: Rudene Re in Treatment: 1 Education Assessment Education Provided To: Patient and Caregiver Education Topics Provided Wound/Skin Impairment: Handouts: Other: CHANGE DRESSING AS ORDERED Methods: Demonstration, Explain/Verbal Responses: State content correctly Electronic Signature(s) Signed: 02/24/2016 5:50:44 PM By: Alejandro Mulling Entered By: Alejandro Mulling on 02/24/2016 15:57:31 Franca, Jaime Limbo  ([id number]) -------------------------------------------------------------------------------- Wound Assessment Details Patient Name: Jaime Zuniga Date of Service: 02/24/2016 3:15 PM Medical Record Number: [id number] Patient Account Number: [id number] Date of Birth/Sex: [birth year] (65 y.o. Male) Treating RN: Phillis Haggis Primary Care Physician: Evelene Croon Other Clinician: Referring Physician: Evelene Croon Treating Physician/Extender: Rudene Re in Treatment: 1 Wound Status Wound Number: 1 Primary Venous Leg Ulcer Etiology: Wound Location: Left Lower Leg - Anterior, Proximal Wound Open Status: Wounding Event: Gradually Appeared Comorbid Chronic Obstructive Pulmonary Disease Date Acquired: 01/26/2016 History: (COPD), Hypertension, Myocardial Weeks Of Treatment: 1 Infarction, Osteoarthritis, Dementia Clustered Wound: No Photos Photo Uploaded By: Alejandro Mulling on 02/24/2016 17:40:47 Wound Measurements Length: (cm) 1.8 Width: (cm) 0.4 Depth: (cm) 0.2 Area: (cm) 0.565 Volume: (cm) 0.113 % Reduction in Area: 53.3% % Reduction in Volume: 68.9% Epithelialization: None Tunneling: No Undermining: No Wound Description Full Thickness Without Exposed Classification: Support Structures Wound Margin: Distinct, outline attached Exudate Large Amount: Exudate Type: Serosanguineous Exudate Color: red, brown Foul Odor After Cleansing: No Wound Bed Granulation Amount: Large (67-100%) Exposed Structure Granulation Quality: Red Fascia Exposed: No Lesser, Sayre ([id number]) Necrotic Amount: Small (1-33%) Fat Layer Exposed: Yes Necrotic Quality: Adherent Slough Tendon Exposed: No Muscle Exposed: No Joint Exposed: No Bone Exposed: No Periwound Skin Texture Texture Color No Abnormalities Noted: No No Abnormalities Noted: No Localized Edema: Yes Erythema: Yes Erythema Location: Circumferential Moisture No Abnormalities Noted: No Temperature /  Pain Dry / Scaly: No Temperature: Cool/Cold Maceration: No Tenderness on Palpation: Yes Moist: Yes Wound Preparation Ulcer Cleansing: Rinsed/Irrigated with Saline Topical Anesthetic Applied: Other: lidocaine 4% cream, Treatment Notes Wound #1 (Left, Proximal, Anterior Lower Leg) 1. Cleansed with: Clean wound with Normal Saline 4. Dressing Applied: Prisma Ag 5. Secondary Dressing Applied ABD Pad Dry Gauze Kerlix/Conform 7. Secured with Tape Notes NETTING Electronic Signature(s) Signed: 02/24/2016 5:50:44 PM By: Alejandro Mulling Entered By: Alejandro Mulling on 02/24/2016 15:49:48 Jaime Zuniga ([id number]) -------------------------------------------------------------------------------- Wound Assessment Details Patient Name: Jaime Zuniga Date of Service: 02/24/2016 3:15 PM Medical Record Number: [id number] Patient Account Number: [id number] Date of Birth/Sex: 11-24-1950 (65 y.o. Male) Treating RN: Phillis Haggis Primary Care Physician:  Lacie Scotts, Meindert Other Clinician: Referring Physician: Evelene Croon Treating Physician/Extender: Rudene Re in Treatment: 1 Wound Status Wound Number: 2 Primary Venous Leg Ulcer Etiology: Wound Location: Left Lower Leg - Anterior, Distal Wound Open Status: Wounding Event: Gradually Appeared Comorbid Chronic Obstructive Pulmonary Disease Date Acquired: 01/26/2016 History: (COPD), Hypertension, Myocardial Weeks Of Treatment: 1 Infarction, Osteoarthritis, Dementia Clustered Wound: No Photos Photo Uploaded By: Alejandro Mulling on 02/24/2016 17:41:04 Wound Measurements Length: (cm) 1.5 Width: (cm) 0.7 Depth: (cm) 0.2 Area: (cm) 0.825 Volume: (cm) 0.165 % Reduction in Area: 30% % Reduction in Volume: 30.1% Epithelialization: None Tunneling: No Undermining: No Wound Description Full Thickness Without Exposed Classification: Support Structures Wound Margin: Distinct, outline  attached Exudate Large Amount: Exudate Type: Serosanguineous Exudate Color: red, brown Foul Odor After Cleansing: No Wound Bed Granulation Amount: Large (67-100%) Exposed Structure Granulation Quality: Red Fascia Exposed: No Thede, Bardia ([id number]) Necrotic Amount: Small (1-33%) Fat Layer Exposed: No Necrotic Quality: Adherent Slough Tendon Exposed: No Muscle Exposed: No Joint Exposed: No Bone Exposed: No Periwound Skin Texture Texture Color No Abnormalities Noted: No No Abnormalities Noted: No Erythema: Yes Moisture Erythema Location: Circumferential No Abnormalities Noted: No Temperature / Pain Temperature: Cool/Cold Tenderness on Palpation: Yes Wound Preparation Ulcer Cleansing: Rinsed/Irrigated with Saline Topical Anesthetic Applied: Other: Lidocaine 4% Cream, Treatment Notes Wound #2 (Left, Distal, Anterior Lower Leg) 1. Cleansed with: Clean wound with Normal Saline 4. Dressing Applied: Prisma Ag 5. Secondary Dressing Applied ABD Pad Dry Gauze Kerlix/Conform 7. Secured with Tape Notes NETTING Electronic Signature(s) Signed: 02/24/2016 5:50:44 PM By: Alejandro Mulling Entered By: Alejandro Mulling on 02/24/2016 15:50:16 Jaime Zuniga ([id number]) -------------------------------------------------------------------------------- Vitals Details Patient Name: Jaime Zuniga Date of Service: 02/24/2016 3:15 PM Medical Record Number: [id number] Patient Account Number: [id number] Date of Birth/Sex: [birth year] (65 y.o. Male) Treating RN: Phillis Haggis Primary Care Physician: Evelene Croon Other Clinician: Referring Physician: Evelene Croon Treating Physician/Extender: Rudene Re in Treatment: 1 Vital Signs Time Taken: 15:39 Temperature (F): 97.6 Height (in): 72 Pulse (bpm): 71 Weight (lbs): 210 Respiratory Rate (breaths/min): 16 Body Mass Index (BMI): 28.5 Blood Pressure (mmHg): 100/56 Reference Range: 80 - 120 mg / dl Electronic  Signature(s) Signed: 02/24/2016 5:50:44 PM By: Alejandro Mulling Entered By: Alejandro Mulling on 02/24/2016 15:41:23

## 2016-02-28 ENCOUNTER — Ambulatory Visit: Payer: Medicare Other | Admitting: Surgery

## 2016-03-09 ENCOUNTER — Encounter: Payer: Medicare Other | Attending: Surgery | Admitting: Surgery

## 2016-03-09 DIAGNOSIS — Z87891 Personal history of nicotine dependence: Secondary | ICD-10-CM | POA: Insufficient documentation

## 2016-03-09 DIAGNOSIS — M1990 Unspecified osteoarthritis, unspecified site: Secondary | ICD-10-CM | POA: Diagnosis not present

## 2016-03-09 DIAGNOSIS — L97222 Non-pressure chronic ulcer of left calf with fat layer exposed: Secondary | ICD-10-CM | POA: Insufficient documentation

## 2016-03-09 DIAGNOSIS — R32 Unspecified urinary incontinence: Secondary | ICD-10-CM | POA: Insufficient documentation

## 2016-03-09 DIAGNOSIS — I739 Peripheral vascular disease, unspecified: Secondary | ICD-10-CM | POA: Diagnosis not present

## 2016-03-09 DIAGNOSIS — G20 Parkinson's disease: Secondary | ICD-10-CM | POA: Insufficient documentation

## 2016-03-09 DIAGNOSIS — I10 Essential (primary) hypertension: Secondary | ICD-10-CM | POA: Insufficient documentation

## 2016-03-09 DIAGNOSIS — F0390 Unspecified dementia without behavioral disturbance: Secondary | ICD-10-CM | POA: Insufficient documentation

## 2016-03-09 DIAGNOSIS — J449 Chronic obstructive pulmonary disease, unspecified: Secondary | ICD-10-CM | POA: Diagnosis not present

## 2016-03-09 DIAGNOSIS — I252 Old myocardial infarction: Secondary | ICD-10-CM | POA: Diagnosis not present

## 2016-03-09 DIAGNOSIS — Z79899 Other long term (current) drug therapy: Secondary | ICD-10-CM | POA: Diagnosis not present

## 2016-03-09 DIAGNOSIS — M61462 Other calcification of muscle, left lower leg: Secondary | ICD-10-CM | POA: Diagnosis not present

## 2016-03-09 DIAGNOSIS — K219 Gastro-esophageal reflux disease without esophagitis: Secondary | ICD-10-CM | POA: Diagnosis not present

## 2016-03-10 NOTE — Progress Notes (Signed)
Jaime, Zuniga ([id number]) Visit Report for 03/09/2016 Arrival Information Details Patient Name: Jaime, Zuniga Date of Service: 03/09/2016 10:15 AM Medical Record Number: [id number] Patient Account Number: [id number] Date of Birth/Sex: 08/13/1950 (65 y.o. Male) Treating RN: Huel Coventry Primary Care Physician: Evelene Croon Other Clinician: Referring Physician: Evelene Croon Treating Physician/Extender: Rudene Re in Treatment: 3 Visit Information History Since Last Visit Added or deleted any medications: No Patient Arrived: Dan Humphreys Any new allergies or adverse reactions: No Arrival Time: 10:26 Had a fall or experienced change in No Accompanied By: caregiver activities of daily living that may affect Transfer Assistance: None risk of falls: Patient Identification Verified: Yes Signs or symptoms of abuse/neglect since last No Secondary Verification Process Yes visito Completed: Hospitalized since last visit: No Patient Requires Transmission-Based No Has Dressing in Place as Prescribed: Yes Precautions: Pain Present Now: No Patient Has Alerts: No Electronic Signature(s) Signed: 03/09/2016 11:08:05 AM By: Elliot Gurney, RN, BSN, Kim RN, BSN Entered By: Elliot Gurney, RN, BSN, Kim on 03/09/2016 10:26:59 Jaime Zuniga ([id number]) -------------------------------------------------------------------------------- Encounter Discharge Information Details Patient Name: Jaime Zuniga Date of Service: 03/09/2016 10:15 AM Medical Record Number: [id number] Patient Account Number: [id number] Date of Birth/Sex: April 11, 1951 (65 y.o. Male) Treating RN: Huel Coventry Primary Care Physician: Evelene Croon Other Clinician: Referring Physician: Evelene Croon Treating Physician/Extender: Rudene Re in Treatment: 3 Encounter Discharge Information Items Discharge Pain Level: 0 Discharge Condition: Stable Ambulatory Status: Walker Nursing Discharge Destination: Home Transportation:  Private Auto Accompanied By: caregiver Schedule Follow-up Appointment: Yes Medication Reconciliation completed Yes and provided to Patient/Care Dawanna Grauberger: Provided on Clinical Summary of Care: 03/09/2016 Form Type Recipient Paper Patient SB Electronic Signature(s) Signed: 03/09/2016 11:06:11 AM By: Gwenlyn Perking Entered By: Gwenlyn Perking on 03/09/2016 11:06:10 Jaime Zuniga ([id number]) -------------------------------------------------------------------------------- Lower Extremity Assessment Details Patient Name: Jaime Zuniga Date of Service: 03/09/2016 10:15 AM Medical Record Number: [id number] Patient Account Number: [id number] Date of Birth/Sex: 02-07-1951 (65 y.o. Male) Treating RN: Huel Coventry Primary Care Physician: Evelene Croon Other Clinician: Referring Physician: Evelene Croon Treating Physician/Extender: Rudene Re in Treatment: 3 Vascular Assessment Pulses: Posterior Tibial Dorsalis Pedis Palpable: [Left:Yes] Extremity colors, hair growth, and conditions: Extremity Color: [Left:Hyperpigmented] Temperature of Extremity: [Left:Warm] Capillary Refill: [Left:< 3 seconds] Toe Nail Assessment Left: Right: Thick: Yes Discolored: Yes Deformed: No Improper Length and Hygiene: No Electronic Signature(s) Signed: 03/09/2016 11:08:05 AM By: Elliot Gurney, RN, BSN, Kim RN, BSN Entered By: Elliot Gurney, RN, BSN, Kim on 03/09/2016 10:30:31 Jaime Zuniga ([id number]) -------------------------------------------------------------------------------- Multi Wound Chart Details Patient Name: Jaime Zuniga Date of Service: 03/09/2016 10:15 AM Medical Record Number: [id number] Patient Account Number: [id number] Date of Birth/Sex: 10-28-1950 (65 y.o. Male) Treating RN: Clover Mealy, RN, BSN, Rita Primary Care Physician: Evelene Croon Other Clinician: Referring Physician: Evelene Croon Treating Physician/Extender: Rudene Re in Treatment: 3 Vital Signs Height(in):  72 Pulse(bpm): 68 Weight(lbs): 210 Blood Pressure 107/66 (mmHg): Body Mass Index(BMI): 28 Temperature(F): 97.6 Respiratory Rate 16 (breaths/min): Photos: [N/A:N/A] Wound Location: Left Lower Leg - Anterior, Left Lower Leg - Anterior, N/A Proximal Distal Wounding Event: Gradually Appeared Gradually Appeared N/A Primary Etiology: Venous Leg Ulcer Venous Leg Ulcer N/A Comorbid History: Chronic Obstructive Chronic Obstructive N/A Pulmonary Disease Pulmonary Disease (COPD), Hypertension, (COPD), Hypertension, Myocardial Infarction, Myocardial Infarction, Osteoarthritis, Dementia Osteoarthritis, Dementia Date Acquired: 01/26/2016 01/26/2016 N/A Weeks of Treatment: 3 3 N/A Wound Status: Open Open N/A Measurements L x W x D 1x0.4x0.1 1x0.7x0.1 N/A (cm) Area (cm) : 0.314 0.55 N/A Volume (cm) : 0.031 0.055 N/A % Reduction in Area: 74.00% 53.30% N/A % Reduction  in Volume: 91.50% 76.70% N/A Classification: Full Thickness Without Full Thickness Without N/A Exposed Support Exposed Support Structures Structures Exudate Amount: Large Large N/A Exudate Type: Serosanguineous Serosanguineous N/A Berlinger, Hue ([id number]) Exudate Color: red, brown red, brown N/A Wound Margin: Distinct, outline attached Distinct, outline attached N/A Granulation Amount: None Present (0%) Large (67-100%) N/A Granulation Quality: N/A Red N/A Necrotic Amount: Large (67-100%) Small (1-33%) N/A Necrotic Tissue: Eschar Adherent Slough N/A Exposed Structures: Fascia: No Fascia: No N/A Fat: No Fat: No Tendon: No Tendon: No Muscle: No Muscle: No Joint: No Joint: No Bone: No Bone: No Epithelialization: None None N/A Periwound Skin Texture: Edema: Yes No Abnormalities Noted N/A Periwound Skin Dry/Scaly: Yes No Abnormalities Noted N/A Moisture: Maceration: No Moist: No Periwound Skin Color: No Abnormalities Noted Erythema: Yes N/A Erythema Location: N/A Circumferential N/A Temperature: Cool/Cold  Cool/Cold N/A Tenderness on Yes Yes N/A Palpation: Wound Preparation: Ulcer Cleansing: Ulcer Cleansing: N/A Rinsed/Irrigated with Rinsed/Irrigated with Saline Saline Topical Anesthetic Topical Anesthetic Applied: Other: lidocaine Applied: Other: Lidocaine 4% cream 4% Cream Treatment Notes Electronic Signature(s) Signed: 03/09/2016 4:00:53 PM By: Elpidio Eric BSN, RN Entered By: Elpidio Eric on 03/09/2016 10:53:47 Jaime Zuniga ([id number]) -------------------------------------------------------------------------------- Multi-Disciplinary Care Plan Details Patient Name: Jaime Zuniga Date of Service: 03/09/2016 10:15 AM Medical Record Number: [id number] Patient Account Number: [id number] Date of Birth/Sex: May 24, 1951 (65 y.o. Male) Treating RN: Clover Mealy, RN, BSN, [place] Primary Care Physician: Evelene Croon Other Clinician: Referring Physician: Evelene Croon Treating Physician/Extender: Rudene Re in Treatment: 3 Active Inactive Abuse / Safety / Falls / Self Care Management Nursing Diagnoses: Potential for falls Goals: Patient will remain injury free Date Initiated: 02/11/2016 Goal Status: Active Interventions: Assess fall risk on admission and as needed Assess self care needs on admission and as needed Notes: Nutrition Nursing Diagnoses: Imbalanced nutrition Goals: Patient/caregiver agrees to and verbalizes understanding of need to use nutritional supplements and/or vitamins as prescribed Date Initiated: 02/11/2016 Goal Status: Active Interventions: Assess patient nutrition upon admission and as needed per policy Notes: Orientation to the Wound Care Program Nursing Diagnoses: Knowledge deficit related to the wound healing center program GoalsHERSEL, Zuniga ([id number]) Patient/caregiver will verbalize understanding of the Wound Healing Center Program Date Initiated: 02/11/2016 Goal Status: Active Interventions: Provide education on orientation to the wound  center Notes: Pain, Acute or Chronic Nursing Diagnoses: Pain, acute or chronic: actual or potential Potential alteration in comfort, pain Goals: Patient will verbalize adequate pain control and receive pain control interventions during procedures as needed Date Initiated: 02/11/2016 Goal Status: Active Patient/caregiver will verbalize adequate pain control between visits Date Initiated: 02/11/2016 Goal Status: Active Interventions: Assess comfort goal upon admission Complete pain assessment as per visit requirements Notes: Soft Tissue Infection Nursing Diagnoses: Impaired tissue integrity Goals: Patient/caregiver will verbalize understanding of or measures to prevent infection and contamination in the home setting Date Initiated: 02/11/2016 Goal Status: Active Patient's soft tissue infection will resolve Date Initiated: 02/11/2016 Goal Status: Active Interventions: Assess signs and symptoms of infection every visit Jaime, Zuniga ([id number]) Notes: Wound/Skin Impairment Nursing Diagnoses: Impaired tissue integrity Knowledge deficit related to smoking impact on wound healing Goals: Patient will demonstrate a reduced rate of smoking or cessation of smoking Date Initiated: 02/11/2016 Goal Status: Active Ulcer/skin breakdown will have a volume reduction of 30% by week 4 Date Initiated: 02/11/2016 Goal Status: Active Ulcer/skin breakdown will have a volume reduction of 50% by week 8 Date Initiated: 02/11/2016 Goal Status: Active Ulcer/skin breakdown will have a volume reduction of 80% by week 12 Date  Initiated: 02/11/2016 Goal Status: Active Interventions: Assess ulceration(s) every visit Notes: Electronic Signature(s) Signed: 03/09/2016 4:00:53 PM By: Elpidio Eric BSN, RN Entered By: Elpidio Eric on 03/09/2016 10:53:25 Jaime Zuniga ([id number]) -------------------------------------------------------------------------------- Pain Assessment Details Patient Name: Jaime Zuniga Date  of Service: 03/09/2016 10:15 AM Medical Record Number: [id number] Patient Account Number: [id number] Date of Birth/Sex: 09/28/1950 (65 y.o. Male) Treating RN: Huel Coventry Primary Care Physician: Evelene Croon Other Clinician: Referring Physician: Evelene Croon Treating Physician/Extender: Rudene Re in Treatment: 3 Active Problems Location of Pain Severity and Description of Pain Patient Has Paino No Site Locations Pain Management and Medication Current Pain Management: Electronic Signature(s) Signed: 03/09/2016 11:08:05 AM By: Elliot Gurney, RN, BSN, Kim RN, BSN Entered By: Elliot Gurney, RN, BSN, Kim on 03/09/2016 10:27:05 Jaime Zuniga ([id number]) -------------------------------------------------------------------------------- Patient/Caregiver Education Details Patient Name: Jaime Zuniga Date of Service: 03/09/2016 10:15 AM Medical Record Number: [id number] Patient Account Number: [id number] Date of Birth/Gender: 1951/02/10 (65 y.o. Male) Treating RN: Huel Coventry Primary Care Physician: Evelene Croon Other Clinician: Referring Physician: Evelene Croon Treating Physician/Extender: Rudene Re in Treatment: 3 Education Assessment Education Provided To: Patient Education Topics Provided Welcome To The Wound Care Center: Wound/Skin Impairment: Handouts: Caring for Your Ulcer Methods: Demonstration Responses: State content correctly Electronic Signature(s) Signed: 03/09/2016 11:08:05 AM By: Elliot Gurney, RN, BSN, Kim RN, BSN Entered By: Elliot Gurney, RN, BSN, Kim on 03/09/2016 11:00:28 Jaime Zuniga ([id number]) -------------------------------------------------------------------------------- Wound Assessment Details Patient Name: Jaime Zuniga Date of Service: 03/09/2016 10:15 AM Medical Record Number: [id number] Patient Account Number: [id number] Date of Birth/Sex: Nov 02, 1950 (65 y.o. Male) Treating RN: Huel Coventry Primary Care Physician: Evelene Croon Other  Clinician: Referring Physician: Evelene Croon Treating Physician/Extender: Rudene Re in Treatment: 3 Wound Status Wound Number: 1 Primary Venous Leg Ulcer Etiology: Wound Location: Left Lower Leg - Anterior, Proximal Wound Open Status: Wounding Event: Gradually Appeared Comorbid Chronic Obstructive Pulmonary Disease Date Acquired: 01/26/2016 History: (COPD), Hypertension, Myocardial Weeks Of Treatment: 3 Infarction, Osteoarthritis, Dementia Clustered Wound: No Photos Wound Measurements Length: (cm) 1 Width: (cm) 0.4 Depth: (cm) 0.1 Area: (cm) 0.314 Volume: (cm) 0.031 % Reduction in Area: 74% % Reduction in Volume: 91.5% Epithelialization: None Tunneling: No Undermining: No Wound Description Full Thickness Without Exposed Classification: Support Structures Wound Margin: Distinct, outline attached Exudate Large Amount: Exudate Type: Serosanguineous Exudate Color: red, brown Foul Odor After Cleansing: No Wound Bed Granulation Amount: None Present (0%) Exposed Structure Necrotic Amount: Large (67-100%) Fascia Exposed: No Necrotic Quality: Eschar Fat Layer Exposed: No Zuniga, Jaime ([id number]) Tendon Exposed: No Muscle Exposed: No Joint Exposed: No Bone Exposed: No Periwound Skin Texture Texture Color No Abnormalities Noted: No No Abnormalities Noted: No Localized Edema: Yes Temperature / Pain Moisture Temperature: Cool/Cold No Abnormalities Noted: No Tenderness on Palpation: Yes Dry / Scaly: Yes Maceration: No Moist: No Wound Preparation Ulcer Cleansing: Rinsed/Irrigated with Saline Topical Anesthetic Applied: Other: lidocaine 4% cream, Treatment Notes Wound #1 (Left, Proximal, Anterior Lower Leg) 1. Cleansed with: Clean wound with Normal Saline 4. Dressing Applied: Prisma Ag 5. Secondary Dressing Applied ABD Pad Kerlix/Conform 7. Secured with Tape Notes NETTING Electronic Signature(s) Signed: 03/09/2016 11:08:05 AM By:  Elliot Gurney, RN, BSN, Kim RN, BSN Entered By: Elliot Gurney, RN, BSN, Kim on 03/09/2016 10:36:40 Jaime Zuniga ([id number]) -------------------------------------------------------------------------------- Wound Assessment Details Patient Name: Jaime Zuniga Date of Service: 03/09/2016 10:15 AM Medical Record Number: [id number] Patient Account Number: [id number] Date of Birth/Sex: Aug 16, 1950 (65 y.o. Male) Treating RN: Huel Coventry Primary Care Physician: Evelene Croon Other Clinician: Referring Physician: Evelene Croon Treating Physician/Extender: Evlyn Kanner  Weeks in Treatment: 3 Wound Status Wound Number: 2 Primary Venous Leg Ulcer Etiology: Wound Location: Left Lower Leg - Anterior, Distal Wound Open Status: Wounding Event: Gradually Appeared Comorbid Chronic Obstructive Pulmonary Disease Date Acquired: 01/26/2016 History: (COPD), Hypertension, Myocardial Weeks Of Treatment: 3 Infarction, Osteoarthritis, Dementia Clustered Wound: No Photos Wound Measurements Length: (cm) 1 Width: (cm) 0.7 Depth: (cm) 0.1 Area: (cm) 0.55 Volume: (cm) 0.055 % Reduction in Area: 53.3% % Reduction in Volume: 76.7% Epithelialization: None Wound Description Full Thickness Without Exposed Classification: Support Structures Wound Margin: Distinct, outline attached Exudate Large Amount: Exudate Type: Serosanguineous Exudate Color: red, brown Foul Odor After Cleansing: No Wound Bed Granulation Amount: Large (67-100%) Exposed Structure Granulation Quality: Red Fascia Exposed: No Necrotic Amount: Small (1-33%) Fat Layer Exposed: No Zuniga, Jaime ([id number]) Necrotic Quality: Adherent Slough Tendon Exposed: No Muscle Exposed: No Joint Exposed: No Bone Exposed: No Periwound Skin Texture Texture Color No Abnormalities Noted: No No Abnormalities Noted: No Erythema: Yes Moisture Erythema Location: Circumferential No Abnormalities Noted: No Temperature / Pain Temperature:  Cool/Cold Tenderness on Palpation: Yes Wound Preparation Ulcer Cleansing: Rinsed/Irrigated with Saline Topical Anesthetic Applied: Other: Lidocaine 4% Cream, Treatment Notes Wound #2 (Left, Distal, Anterior Lower Leg) 1. Cleansed with: Clean wound with Normal Saline 4. Dressing Applied: Prisma Ag 5. Secondary Dressing Applied ABD Pad Kerlix/Conform 7. 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 Male) Treating RN: Huel Coventry Primary Care Physician: Evelene Croon Other Clinician: Referring Physician: Evelene Croon Treating Physician/Extender: Rudene Re in Treatment: 3 Vital Signs Time Taken: 10:28 Temperature (F): 97.6 Height (in): 72 Pulse (bpm): 68 Weight (lbs): 210 Respiratory Rate (breaths/min): 16 Body Mass Index (BMI): 28.5 Blood Pressure (mmHg): 107/66 Reference Range: 80 - 120 mg / dl Electronic Signature(s) Signed: 03/09/2016 11:08:05 AM By: Elliot Gurney, RN, BSN, Kim RN, BSN Entered By: Elliot Gurney, RN, BSN, Kim on 03/09/2016 10:29:35

## 2016-03-10 NOTE — Progress Notes (Signed)
Jaime, Zuniga ([id number]) Visit Report for 03/09/2016 Chief Complaint Document Details Patient Name: Jaime Zuniga, Jaime Zuniga Date of Service: 03/09/2016 10:15 AM Medical Record Number: [id number] Patient Account Number: [id number] Date of Birth/Sex: 1951-08-04 (65 y.o. 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 Male) Treating RN: Huel Coventry Primary Care Physician: Evelene Croon Other Clinician: Referring Physician: Evelene Croon Treating Physician/Extender: Rudene Re in Treatment: 3 Debridement Performed for Wound #2 Left,Distal,Anterior Lower Leg Assessment: Performed By: Physician Evlyn Kanner, MD Debridement: Open Wound/Selective Debridement Selective Description: Pre-procedure Yes Verification/Time Out Taken: Start Time: 10:53 Pain Control: Lidocaine 4% Topical Solution Level: Non-Viable Tissue Total Area Debrided (L x 1 (cm) x 0.7 (cm) = 0.7 (cm) W): Tissue and other Non-Viable, Eschar, Fibrin/Slough material debrided: Instrument: Other : gauze and saline Bleeding: Minimum Hemostasis Achieved: Pressure End Time: 10:56 Procedural Pain: 0 Post Procedural Pain: 0 Response to  Treatment: Procedure was tolerated well Post Debridement Measurements of Total Wound Length: (cm) 1 Width: (cm) 0.7 Depth: (cm) 0.1 Volume: (cm) 0.055 Post Procedure Diagnosis Same as Pre-procedure Electronic Signature(s) Signed: 03/09/2016 11:05:46 AM By: Evlyn Kanner MD, FACS Signed: 03/09/2016 11:08:05 AM By: Elliot Gurney RN, BSN, Kim RN, BSN Entered By: Evlyn Kanner on 03/09/2016 11:05:46 Jaime Zuniga ([id number]) -------------------------------------------------------------------------------- HPI Details Patient Name: Jaime Zuniga Date of Service: 03/09/2016 10:15 AM Medical Record Number: [id number] Patient Account Number: [id number] Date of Birth/Sex: 1950/09/06 (65 y.o. Male) Treating RN: Huel Coventry Primary Care Physician: Evelene Croon Other Clinician: Referring Physician: Evelene Croon Treating Physician/Extender: Rudene Re in Treatment: 3 History of Present Illness Location: left lower extremity anterior shin area Quality: Patient reports experiencing a dull pain to affected area(s). Severity: Patient states wound are getting worse. Duration: Patient has had the wound for > 3 months prior to seeking treatment at the wound center Timing: Pain in wound is Intermittent (comes and goes Context: The wound appeared gradually over time Modifying Factors: Other treatment(s) tried include:Augmentin and doxycycline Associated Signs and Symptoms: Patient reports having increase swelling. HPI Description: 65 year old gentleman was being seen in the ER recently for wounds on his left lower extremity which have been draining and there is swelling and he was concerned about wound infection. in the ER x-ray of the left leg was done which showed postsurgical changes but no acute findings.he wrapped his leg in and Unna's boots and referred him to the wound center. earlier in June he had a DVT study done of the left lower extremity which showed no evidence of DVT but there  was edema seen. no reflux was seen on that study. in June he was seen by a surgeon Dr. Lemar Livings who advised symptomatic treatment. the patient was also seen at the [facility] he has recently completed courses of Augmentin and doxycycline. 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 02/24/2016 --  seen by Dr. Kirke Corin on 02/17/2016. Lower extremity arterial studies were done which showed a normal ABI bilaterally and normal great toe pressures. Duplex showed diffuse nonobstructive disease and a three-vessel runoff bilaterally. No further vascular studies or intervention was recommended. He was seen by Dr. Romilda Joy on 02/17/2016 and recommended to continue with wound care and no vascular studies intervention was recommended. Electronic Signature(s) Signed: 03/09/2016 11:06:00 AM By: Evlyn Kanner MD, FACS Entered By: Evlyn Kanner on 03/09/2016 11:06:00 Jaime Zuniga ([id number]) -------------------------------------------------------------------------------- Physical Exam Details Patient Name: Jaime Zuniga Date of Service: 03/09/2016 10:15 AM Medical Record Number: [id number] Patient Account Number: [id number] Date of Birth/Sex: 1950/08/31 (65 y.o. Male) Treating RN: Huel Coventry Primary Care Physician: Evelene Croon Other Clinician: Referring Physician: Evelene Croon Treating Physician/Extender: Rudene Re in Treatment: 3 Constitutional . Pulse regular. Respirations normal and unlabored. Afebrile. . Eyes Nonicteric. Reactive to light. Ears, Nose, Mouth, and Throat Lips, teeth, and gums WNL.Marland Kitchen Moist mucosa without lesions. Neck supple and nontender. No palpable supraclavicular or cervical adenopathy. Normal sized without goiter. Respiratory WNL. No retractions.. Cardiovascular Pedal Pulses WNL. No clubbing, cyanosis or edema. Chest Breasts symmetical  and no nipple discharge.. Breast tissue WNL, no masses, lumps, or tenderness.. Lymphatic No adneopathy. No adenopathy. No adenopathy. Musculoskeletal Adexa without tenderness or enlargement.. Digits and nails w/o clubbing, cyanosis, infection, petechiae, ischemia, or inflammatory conditions.. Integumentary (Hair, Skin) No suspicious lesions. No crepitus or fluctuance. No peri-wound warmth or erythema. No masses.Marland Kitchen Psychiatric Judgement and insight Intact.. No evidence of depression, anxiety, or agitation.. Notes wound is covered with eschar but with moist saline gauze I was able to debride all of it and there is minimal bleeding and healthy granulation tissue Electronic Signature(s) Signed: 03/09/2016 11:06:34 AM By: Evlyn Kanner MD, FACS Entered By: Evlyn Kanner on 03/09/2016 11:06:33 Jaime Zuniga ([id number]) -------------------------------------------------------------------------------- Physician Orders Details Patient Name: Jaime Zuniga Date of Service: 03/09/2016 10:15 AM Medical Record Number: [id number] Patient Account Number: [id number] Date of Birth/Sex: 1950/12/03 (65 y.o. Male) Treating RN: Clover Mealy, RN, BSN, [place] Primary Care Physician: Evelene Croon Other Clinician: Referring Physician: Evelene Croon Treating Physician/Extender: Rudene Re in Treatment: 3 Verbal / Phone Orders: Yes Clinician: Afful, RN, BSN, Rita Read Back and Verified: Yes Diagnosis Coding Wound Cleansing Wound #1 Left,Proximal,Anterior Lower Leg o Clean wound with Normal Saline. o Cleanse wound with mild soap and water o May Shower, gently pat wound dry prior to applying new dressing. Wound #2 Left,Distal,Anterior Lower Leg o Clean wound with Normal Saline. o Cleanse wound with mild soap and water o May Shower, gently pat wound dry prior to applying new dressing. Anesthetic Wound #1 Left,Proximal,Anterior Lower Leg o Topical Lidocaine 4% cream applied to wound bed  prior to debridement - for clinic use Wound #2 Left,Distal,Anterior Lower Leg o Topical Lidocaine 4% cream applied to wound bed prior to debridement - for clinic use Skin Barriers/Peri-Wound Care Wound #1 Left,Proximal,Anterior Lower Leg o Barrier cream Wound #2 Left,Distal,Anterior Lower Leg o Barrier cream Primary Wound Dressing Wound #1 Left,Proximal,Anterior Lower Leg o Prisma Ag - please moisten with saline Wound #2 Left,Distal,Anterior Lower Leg o Prisma Ag - please moisten with saline Secondary Dressing Wound #1 Left,Proximal,Anterior Lower Leg o ABD pad o Dry Gauze - netting Ehmann, Byrd ([id number]) Wound #2 Left,Distal,Anterior Lower Leg o ABD pad o Dry Gauze - netting Dressing Change Frequency Wound #1 Left,Proximal,Anterior Lower Leg o Change dressing every other day. Wound #2 Left,Distal,Anterior Lower Leg o Change dressing every other day. Follow-up Appointments Wound #1 Left,Proximal,Anterior  Lower Leg o Return Appointment in 1 week. Wound #2 Left,Distal,Anterior Lower Leg o Return Appointment in 1 week. Edema Control Wound #1 Left,Proximal,Anterior Lower Leg o Elevate legs to the level of the heart and pump ankles as often as possible Wound #2 Left,Distal,Anterior Lower Leg o Elevate legs to the level of the heart and pump ankles as often as possible Additional Orders / Instructions Wound #1 Left,Proximal,Anterior Lower Leg o Stop Smoking o Increase protein intake. Wound #2 Left,Distal,Anterior Lower Leg o Stop Smoking o Increase protein intake. Home Health Wound #1 Left,Proximal,Anterior Lower Leg o Continue Home Health Visits - [facility] o Home Health Nurse may visit PRN to address patientos wound care needs. o FACE TO FACE ENCOUNTER: MEDICARE and MEDICAID PATIENTS: I certify that this patient is under my care and that I had a face-to-face encounter that meets the physician face-to-face encounter  requirements with this patient on this date. The encounter with the patient was in whole or in part for the following MEDICAL CONDITION: (primary reason for Home Healthcare) MEDICAL NECESSITY: I certify, that based on my findings, NURSING services are a medically necessary home health service. HOME BOUND STATUS: I certify that my clinical findings support that this patient is homebound (i.e., Due to illness or injury, pt requires aid of supportive devices such as crutches, cane, wheelchairs, walkers, the use of special Rice, Ric ([id number]) transportation or the assistance of another person to leave their place of residence. There is a normal inability to leave the home and doing so requires considerable and taxing effort. Other absences are for medical reasons / religious services and are infrequent or of short duration when for other reasons). o If current dressing causes regression in wound condition, may D/C ordered dressing product/s and apply Normal Saline Moist Dressing daily until next Wound Healing Center / Other MD appointment. Notify Wound Healing Center of regression in wound condition at [phone]. o Please direct any NON-WOUND related issues/requests for orders to patient's Primary Care Physician Wound #2 Left,Distal,Anterior Lower Leg o Continue Home Health Visits - [facility] o Home Health Nurse may visit PRN to address patientos wound care needs. o FACE TO FACE ENCOUNTER: MEDICARE and MEDICAID PATIENTS: I certify that this patient is under my care and that I had a face-to-face encounter that meets the physician face-to-face encounter requirements with this patient on this date. The encounter with the patient was in whole or in part for the following MEDICAL CONDITION: (primary reason for Home Healthcare) MEDICAL NECESSITY: I certify, that based on my findings, NURSING services are a medically necessary home health service. HOME BOUND STATUS: I certify that my  clinical findings support that this patient is homebound (i.e., Due to illness or injury, pt requires aid of supportive devices such as crutches, cane, wheelchairs, walkers, the use of special transportation or the assistance of another person to leave their place of residence. There is a normal inability to leave the home and doing so requires considerable and taxing effort. Other absences are for medical reasons / religious services and are infrequent or of short duration when for other reasons). o If current dressing causes regression in wound condition, may D/C ordered dressing product/s and apply Normal Saline Moist Dressing daily until next Wound Healing Center / Other MD appointment. Notify Wound Healing Center of regression in wound condition at [phone]. o Please direct any NON-WOUND related issues/requests for orders to patient's Primary Care Physician Medications-please add to medication list. Wound #1 Left,Proximal,Anterior Lower Leg o Other: - vitamin  C, zinc, multivitamin Wound #2 Left,Distal,Anterior Lower Leg o Other: - vitamin C, zinc, multivitamin Electronic Signature(s) Signed: 03/09/2016 4:00:53 PM By: Elpidio Eric BSN, RN Signed: 03/09/2016 4:34:24 PM By: Evlyn Kanner MD, FACS Entered By: Elpidio Eric on 03/09/2016 10:57:58 Jaime Zuniga ([id number]) -------------------------------------------------------------------------------- Problem List Details Patient Name: Jaime Zuniga Date of Service: 03/09/2016 10:15 AM Medical Record Number: [id number] Patient Account Number: [id number] Date of Birth/Sex: Mar 30, 1951 (65 y.o. 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 Severity: Patient states wound are getting worse. Duration: Patient has had the wound for > 3 months prior to seeking treatment at the wound center Timing: Pain in wound is Intermittent (comes and goes Context: The wound appeared gradually over time Modifying Factors: Other treatment(s) tried include:Augmentin and doxycycline Associated Signs and Symptoms: Patient reports having increase swelling. 65 year old gentleman was being seen in the ER recently for wounds on his left lower extremity which have been draining and there is swelling and he was concerned about wound infection. in the ER x-ray of the left leg was done which showed postsurgical changes but no  acute findings.he wrapped his leg in and Unna's boots and referred him to the wound center. earlier in June he had a DVT study done of the left lower extremity which showed no evidence of DVT but there was edema seen. no reflux was seen on that study. in June he was seen by a surgeon Dr. Lemar Livings who advised symptomatic treatment. the patient was also seen at the [facility] he has recently completed courses of Augmentin and doxycycline. 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 02/24/2016 -- seen by Dr. Kirke Corin on 02/17/2016. Lower extremity arterial studies were done which showed a normal ABI bilaterally and normal great toe pressures. Duplex showed diffuse nonobstructive disease and a three-vessel runoff bilaterally. No further vascular studies or intervention was recommended. He was seen by Dr. Romilda Joy on 02/17/2016 and recommended to continue with wound care and no vascular studies intervention was recommended. Glasgow, Irene Limbo ([id number]) Objective Constitutional Pulse regular. Respirations normal and unlabored. Afebrile. Vitals Time Taken: 10:28 AM, Height: 72 in, Weight: 210 lbs, BMI: 28.5, Temperature: 97.6 F, Pulse: 68 bpm, Respiratory Rate: 16 breaths/min, Blood Pressure: 107/66  mmHg. Eyes Nonicteric. Reactive to light. Ears, Nose, Mouth, and Throat Lips, teeth, and gums WNL.Marland Kitchen Moist mucosa without lesions. Neck supple and nontender. No palpable supraclavicular or cervical adenopathy. Normal sized without goiter. Respiratory WNL. No retractions.. Cardiovascular Pedal Pulses WNL. No clubbing, cyanosis or edema. Chest Breasts symmetical and no nipple discharge.. Breast tissue WNL, no masses, lumps, or tenderness.. Lymphatic No adneopathy. No adenopathy. No adenopathy. Musculoskeletal Adexa without tenderness or enlargement.. Digits and nails w/o  clubbing, cyanosis, infection, petechiae, ischemia, or inflammatory conditions.Marland Kitchen Psychiatric Judgement and insight Intact.. No evidence of depression, anxiety, or agitation.. General Notes: wound is covered with eschar but with moist saline gauze I was able to debride all of it and there is minimal bleeding and healthy granulation tissue Integumentary (Hair, Skin) No suspicious lesions. No crepitus or fluctuance. No peri-wound warmth or erythema. No masses.. Wound #1 status is Open. Original cause of wound was Gradually Appeared. The wound is located on the Left,Proximal,Anterior Lower Leg. The wound measures 1cm length x 0.4cm width x 0.1cm depth; 0.314cm^2 area and 0.031cm^3 volume. There is no tunneling or undermining noted. There is a large amount of serosanguineous drainage noted. The wound margin is distinct with the outline attached to the wound base. There is no granulation within the wound bed. There is a large (67-100%) amount of necrotic Val, Revanth ([id number]) tissue within the wound bed including Eschar. The periwound skin appearance exhibited: Localized Edema, Dry/Scaly. The periwound skin appearance did not exhibit: Maceration, Moist. Periwound temperature was noted as Cool/Cold. The periwound has tenderness on palpation. Wound #2 status is Open. Original cause of wound was Gradually Appeared. The wound is located on the [employer] Lower Leg. The wound measures 1cm length x 0.7cm width x 0.1cm depth; 0.55cm^2 area and 0.055cm^3 volume. There is a large amount of serosanguineous drainage noted. The wound margin is distinct with the outline attached to the wound base. There is large (67-100%) red granulation within the wound bed. There is a small (1-33%) amount of necrotic tissue within the wound bed including Adherent Slough. The periwound skin appearance exhibited: Erythema. The surrounding wound skin color is noted with erythema which is circumferential. Periwound  temperature was noted as Cool/Cold. The periwound has tenderness on palpation. 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 There was a Non-Viable Tissue Open Wound/Selective [phone]) debridement with total area of 0.7 sq cm performed by Evlyn Kanner, MD. with the following instrument(s): gauze and saline to remove Non-Viable tissue/material including Fibrin/Slough and Eschar after achieving pain control using Lidocaine 4% Topical Solution. A time out was conducted prior to the start of the procedure. A Minimum amount of bleeding was controlled with Pressure. The procedure was tolerated well with a pain level of 0 throughout and a pain level of 0 following the procedure. Post Debridement Measurements: 1cm length x 0.7cm width x 0.1cm depth; 0.055cm^3 volume. Post procedure Diagnosis Wound #2: Same as Pre-Procedure Plan Baden, Welby ([id number]) Wound Cleansing: Wound #1 Left,Proximal,Anterior Lower Leg: Clean wound with Normal Saline. Cleanse wound with mild soap and water May Shower, gently pat wound dry prior to applying new dressing. Wound #2 Left,Distal,Anterior Lower Leg: Clean wound with Normal Saline. Cleanse wound with mild soap and water May Shower, gently pat wound dry prior to applying new  dressing. Anesthetic: Wound #1 Left,Proximal,Anterior Lower Leg: Topical Lidocaine 4% cream applied to wound bed prior to debridement - for clinic use Wound #2 Left,Distal,Anterior Lower Leg: Topical Lidocaine 4% cream applied to wound bed prior to debridement - for clinic use Skin Barriers/Peri-Wound Care: Wound #1 Left,Proximal,Anterior Lower Leg: Barrier cream Wound #2 Left,Distal,Anterior Lower Leg: Barrier  cream Primary Wound Dressing: Wound #1 Left,Proximal,Anterior Lower Leg: Prisma Ag - please moisten with saline Wound #2 Left,Distal,Anterior Lower Leg: Prisma Ag - please moisten with saline Secondary Dressing: Wound #1 Left,Proximal,Anterior Lower Leg: ABD pad Dry Gauze - netting Wound #2 Left,Distal,Anterior Lower Leg: ABD pad Dry Gauze - netting Dressing Change Frequency: Wound #1 Left,Proximal,Anterior Lower Leg: Change dressing every other day. Wound #2 Left,Distal,Anterior Lower Leg: Change dressing every other day. Follow-up Appointments: Wound #1 Left,Proximal,Anterior Lower Leg: Return Appointment in 1 week. Wound #2 Left,Distal,Anterior Lower Leg: Return Appointment in 1 week. Edema Control: Wound #1 Left,Proximal,Anterior Lower Leg: Elevate legs to the level of the heart and pump ankles as often as possible Wound #2 Left,Distal,Anterior Lower Leg: Elevate legs to the level of the heart and pump ankles as often as possible Additional Orders / Instructions: Wound #1 Left,Proximal,Anterior Lower Leg: Stop Smoking Increase protein intake. MAHIN, GUARDIA ([id number]) Wound #2 Left,Distal,Anterior Lower Leg: Stop Smoking Increase protein intake. Home Health: Wound #1 Left,Proximal,Anterior Lower Leg: Continue Home Health Visits - [facility] Nurse may visit PRN to address patient s wound care needs. FACE TO FACE ENCOUNTER: MEDICARE and MEDICAID PATIENTS: I certify that this patient is under my care and that I had a face-to-face encounter that meets the physician face-to-face encounter requirements with this patient on this date. The encounter with the patient was in whole or in part for the following MEDICAL CONDITION: (primary reason for Home Healthcare) MEDICAL NECESSITY: I certify, that based on my findings, NURSING services are a medically necessary home health service. HOME BOUND STATUS: I certify that my clinical findings support that this patient is  homebound (i.e., Due to illness or injury, pt requires aid of supportive devices such as crutches, cane, wheelchairs, walkers, the use of special transportation or the assistance of another person to leave their place of residence. There is a normal inability to leave the home and doing so requires considerable and taxing effort. Other absences are for medical reasons / religious services and are infrequent or of short duration when for other reasons). If current dressing causes regression in wound condition, may D/C ordered dressing product/s and apply Normal Saline Moist Dressing daily until next Wound Healing Center / Other MD appointment. Notify Wound Healing Center of regression in wound condition at [phone]. Please direct any NON-WOUND related issues/requests for orders to patient's Primary Care Physician Wound #2 Left,Distal,Anterior Lower Leg: Continue Home Health Visits - [facility] Nurse may visit PRN to address patient s wound care needs. FACE TO FACE ENCOUNTER: MEDICARE and MEDICAID PATIENTS: I certify that this patient is under my care and that I had a face-to-face encounter that meets the physician face-to-face encounter requirements with this patient on this date. The encounter with the patient was in whole or in part for the following MEDICAL CONDITION: (primary reason for Home Healthcare) MEDICAL NECESSITY: I certify, that based on my findings, NURSING services are a medically necessary home health service. HOME BOUND STATUS: I certify that my clinical findings support that this patient is homebound (i.e., Due to illness or injury, pt requires aid of supportive devices such as crutches, cane, wheelchairs,  walkers, the use of special transportation or the assistance of another person to leave their place of residence. There is a normal inability to leave the home and doing so requires considerable and taxing effort. Other absences are for medical reasons /  religious services and are infrequent or of short duration when for other reasons). If current dressing causes regression in wound condition, may D/C ordered dressing product/s and apply Normal Saline Moist Dressing daily until next Wound Healing Center / Other MD appointment. Notify Wound Healing Center of regression in wound condition at [phone]. Please direct any NON-WOUND related issues/requests for orders to patient's Primary Care Physician Medications-please add to medication list.: Wound #1 Left,Proximal,Anterior Lower Leg: Other: - vitamin C, zinc, multivitamin Wound #2 Left,Distal,Anterior Lower Leg: Other: - vitamin C, zinc, multivitamin I have recommended: Lamere, Ramses ([id number]) 1. Prisma Ag to the wounds and lightly covered with a gauze and Kerlix dressing to be changed daily 2. Arterial duplex study to be performed and these reports have been reviewed with him 3. He says has stopped smoking since we spoke to him last 4. regular visits to the wound center Electronic Signature(s) Signed: 03/09/2016 11:07:06 AM By: Evlyn Kanner MD, FACS Entered By: Evlyn Kanner on 03/09/2016 11:07:05 Jaime Zuniga ([id number]) -------------------------------------------------------------------------------- SuperBill Details Patient Name: Jaime Zuniga Date of Service: 03/09/2016 Medical Record Number: [id number] Patient Account Number: [id number] Date of Birth/Sex: [birth year] (65 y.o. Male) Treating RN: Huel Coventry Primary Care Physician: Evelene Croon Other Clinician: Referring Physician: Evelene Croon Treating Physician/Extender: Rudene Re in Treatment: 3 Diagnosis Coding ICD-10 Codes Code Description [phone] Non-pressure chronic ulcer of left calf with fat layer exposed M61.462 Other calcification of muscle, left lower leg I73.9 Peripheral vascular disease, unspecified Facility Procedures CPT4 Code: [id number] Description: 97597 - DEBRIDE WOUND 1ST 20 SQ CM OR  < ICD-10 Description Diagnosis L97.222 Non-pressure chronic ulcer of left calf with fat la M61.462 Other calcification of muscle, left lower leg I73.9 Peripheral vascular disease, unspecified Modifier: yer exposed Quantity: 1 Physician Procedures CPT4 Code: [id number] Description: 97597 - WC PHYS DEBR WO ANESTH 20 SQ CM ICD-10 Description Diagnosis L97.222 Non-pressure chronic ulcer of left calf with fat la M61.462 Other calcification of muscle, left lower leg I73.9 Peripheral vascular disease, unspecified Modifier: yer exposed Quantity: 1 Electronic Signature(s) Signed: 03/09/2016 11:07:18 AM By: Evlyn Kanner MD, FACS Entered By: Evlyn Kanner on 03/09/2016 11:07:17

## 2016-03-16 ENCOUNTER — Ambulatory Visit: Payer: Medicare Other | Admitting: Surgery

## 2016-03-23 ENCOUNTER — Encounter (HOSPITAL_BASED_OUTPATIENT_CLINIC_OR_DEPARTMENT_OTHER): Payer: Medicare Other | Admitting: General Surgery

## 2016-03-23 DIAGNOSIS — L97222 Non-pressure chronic ulcer of left calf with fat layer exposed: Secondary | ICD-10-CM | POA: Diagnosis not present

## 2016-03-23 NOTE — Progress Notes (Signed)
SEE I HEAL NOTE

## 2016-03-24 NOTE — Progress Notes (Signed)
Jaime MaclachlanBURTON, Donterrius ([id number]) Visit Report for 03/23/2016 Arrival Information Details Patient Name: Jaime MaclachlanBURTON, Williamson Date of Service: 03/23/2016 9:15 AM Medical Record Number: [id number] Patient Account Number: [id number] Date of Birth/Sex: [birth year] 26(65 y.o. Male) Treating RN: Curtis Sitesorthy, Joanna Primary Care Physician: Evelene CroonNiemeyer, Meindert Other Clinician: Referring Physician: Evelene CroonNiemeyer, Meindert Treating Physician/Extender: Rudene ReBritto, Errol Weeks in Treatment: 5 Visit Information History Since Last Visit Added or deleted any medications: No Patient Arrived: Dan HumphreysWalker Any new allergies or adverse reactions: No Arrival Time: 09:25 Had a fall or experienced change in No Accompanied By: staff activities of daily living that may affect Transfer Assistance: None risk of falls: Patient Identification Verified: Yes Signs or symptoms of abuse/neglect since last No Secondary Verification Process Completed: Yes visito Patient Requires Transmission-Based No Hospitalized since last visit: No Precautions: Pain Present Now: No Patient Has Alerts: No Electronic Signature(s) Signed: 03/23/2016 4:58:15 PM By: Curtis Sitesorthy, Joanna Entered By: Curtis Sitesorthy, Joanna on 03/23/2016 09:27:37 Jaime MaclachlanBURTON, Christan ([id number]) -------------------------------------------------------------------------------- Encounter Discharge Information Details Patient Name: Jaime MaclachlanBURTON, Martavious Date of Service: 03/23/2016 9:15 AM Medical Record Number: [id number] Patient Account Number: [id number] Date of Birth/Sex: [birth year] 28(65 y.o. Male) Treating RN: Curtis Sitesorthy, Joanna Primary Care Physician: Evelene CroonNiemeyer, Meindert Other Clinician: Referring Physician: Evelene CroonNiemeyer, Meindert Treating Physician/Extender: Elayne SnarePARKER, PETER Weeks in Treatment: 5 Encounter Discharge Information Items Discharge Pain Level: 0 Discharge Condition: Stable Ambulatory Status: Walker Discharge Destination: Home Transportation: Private Auto Accompanied By: staff Schedule Follow-up Appointment:  Yes Medication Reconciliation completed No and provided to Patient/Care Willadene Mounsey: Provided on Clinical Summary of Care: 03/23/2016 Form Type Recipient Paper Patient SB Electronic Signature(s) Signed: 03/23/2016 11:14:48 AM By: Ardath SaxParker, Peter MD Previous Signature: 03/23/2016 10:12:52 AM Version By: Curtis Sitesorthy, Joanna Previous Signature: 03/23/2016 10:00:32 AM Version By: Gwenlyn PerkingMoore, Shelia Entered By: Ardath SaxParker, Peter on 03/23/2016 11:14:48 Jaime MaclachlanBURTON, Mykle ([id number]) -------------------------------------------------------------------------------- Lower Extremity Assessment Details Patient Name: Jaime MaclachlanBURTON, Rasean Date of Service: 03/23/2016 9:15 AM Medical Record Number: [id number] Patient Account Number: [id number] Date of Birth/Sex: [birth year] (65 y.o. Male) Treating RN: Curtis Sitesorthy, Joanna Primary Care Physician: Evelene CroonNiemeyer, Meindert Other Clinician: Referring Physician: Evelene CroonNiemeyer, Meindert Treating Physician/Extender: Rudene ReBritto, Errol Weeks in Treatment: 5 Vascular Assessment Pulses: Posterior Tibial Dorsalis Pedis Palpable: [Left:Yes] Extremity colors, hair growth, and conditions: Extremity Color: [Left:Hyperpigmented] Hair Growth on Extremity: [Left:Yes] Temperature of Extremity: [Left:Warm] Capillary Refill: [Left:< 3 seconds] Electronic Signature(s) Signed: 03/23/2016 4:58:15 PM By: Curtis Sitesorthy, Joanna Entered By: Curtis Sitesorthy, Joanna on 03/23/2016 09:34:56 Jaime MaclachlanBURTON, Frederich ([id number]) -------------------------------------------------------------------------------- Multi Wound Chart Details Patient Name: Jaime MaclachlanBURTON, Amair Date of Service: 03/23/2016 9:15 AM Medical Record Number: [id number] Patient Account Number: [id number] Date of Birth/Sex: [birth year] 9(65 y.o. Male) Treating RN: Clover MealyAfful, RN, BSN, Rita Primary Care Physician: Evelene CroonNiemeyer, Meindert Other Clinician: Referring Physician: Evelene CroonNiemeyer, Meindert Treating Physician/Extender: Ardath SaxPARKER, PETER Weeks in Treatment: 5 Vital Signs Height(in): 72 Pulse(bpm):  63 Weight(lbs): 210 Blood Pressure 93/59 (mmHg): Body Mass Index(BMI): 28 Temperature(F): 97.8 Respiratory Rate 18 (breaths/min): Photos: Wound Location: Left Lower Leg - Anterior, Left Lower Leg - Anterior, Left Lower Leg - Lateral Proximal Distal Wounding Event: Gradually Appeared Gradually Appeared Trauma Primary Etiology: Venous Leg Ulcer Venous Leg Ulcer Trauma, Other Comorbid History: Chronic Obstructive Chronic Obstructive Chronic Obstructive Pulmonary Disease Pulmonary Disease Pulmonary Disease (COPD), Hypertension, (COPD), Hypertension, (COPD), Hypertension, Myocardial Infarction, Myocardial Infarction, Myocardial Infarction, Osteoarthritis, Dementia Osteoarthritis, Dementia Osteoarthritis, Dementia Date Acquired: 01/26/2016 01/26/2016 03/23/2016 Weeks of Treatment: 5 5 0 Wound Status: Open Open Open Measurements L x W x D 0.7x0.3x0.1 1.5x0.7x0.1 0.5x0.7x0.1 (cm) Area (cm) : 0.165 0.825 0.275 Volume (cm) : 0.016 0.082 0.027 % Reduction in Area: 86.40% 30.00% N/A %  Reduction in Volume: 95.60% 65.30% N/A Classification: Full Thickness Without Full Thickness Without Partial Thickness Exposed Support Exposed Support Structures Structures Exudate Amount: Large Large Medium Exudate Type: Serosanguineous Serosanguineous Serous Ivanhoe, Kemal ([id number]) Exudate Color: red, brown red, brown amber Wound Margin: Distinct, outline attached Distinct, outline attached Flat and Intact Granulation Amount: Small (1-33%) Large (67-100%) Large (67-100%) Granulation Quality: Pink Red Red Necrotic Amount: Large (67-100%) Small (1-33%) None Present (0%) Necrotic Tissue: Eschar Adherent Slough N/A Exposed Structures: Fascia: No Fascia: No Fascia: No Fat: No Fat: No Fat: No Tendon: No Tendon: No Tendon: No Muscle: No Muscle: No Muscle: No Joint: No Joint: No Joint: No Bone: No Bone: No Bone: No Limited to Skin Breakdown Epithelialization: None None None Periwound  Skin Texture: Edema: Yes No Abnormalities Noted Edema: No Excoriation: No Induration: No Callus: No Crepitus: No Fluctuance: No Friable: No Rash: No Scarring: No Periwound Skin Dry/Scaly: Yes No Abnormalities Noted Maceration: No Moisture: Maceration: No Moist: No Moist: No Dry/Scaly: No Periwound Skin Color: No Abnormalities Noted Erythema: Yes Atrophie Blanche: No Cyanosis: No Ecchymosis: No Erythema: No Hemosiderin Staining: No Mottled: No Pallor: No Rubor: No Erythema Location: N/A Circumferential N/A Temperature: Cool/Cold Cool/Cold No Abnormality Tenderness on Yes Yes No Palpation: Wound Preparation: Ulcer Cleansing: Ulcer Cleansing: Ulcer Cleansing: Rinsed/Irrigated with Rinsed/Irrigated with Rinsed/Irrigated with Saline Saline Saline Topical Anesthetic Topical Anesthetic Topical Anesthetic Applied: Other: lidocaine Applied: Other: Lidocaine Applied: Other: lidocaine 4% cream 4% Cream 4% Treatment Notes TARREN, VELARDI ([id number]) Electronic Signature(s) Signed: 03/23/2016 12:00:13 PM By: Elpidio Eric BSN, RN Entered By: Elpidio Eric on 03/23/2016 09:51:17 Jaime Zuniga ([id number]) -------------------------------------------------------------------------------- Multi-Disciplinary Care Plan Details Patient Name: Jaime Zuniga Date of Service: 03/23/2016 9:15 AM Medical Record Number: [id number] Patient Account Number: [id number] Date of Birth/Sex: 04/26/1951 (65 y.o. Male) Treating RN: Clover Mealy, RN, BSN, Kimberly Sink Primary Care Physician: Evelene Croon Other Clinician: Referring Physician: Evelene Croon Treating Physician/Extender: Elayne Snare in Treatment: 5 Active Inactive Abuse / Safety / Falls / Self Care Management Nursing Diagnoses: Potential for falls Goals: Patient will remain injury free Date Initiated: 02/11/2016 Goal Status: Active Interventions: Assess fall risk on admission and as needed Assess self care needs on admission and as  needed Notes: Nutrition Nursing Diagnoses: Imbalanced nutrition Goals: Patient/caregiver agrees to and verbalizes understanding of need to use nutritional supplements and/or vitamins as prescribed Date Initiated: 02/11/2016 Goal Status: Active Interventions: Assess patient nutrition upon admission and as needed per policy Notes: Orientation to the Wound Care Program Nursing Diagnoses: Knowledge deficit related to the wound healing center program GoalsYAHMIR, SOKOLOV ([id number]) Patient/caregiver will verbalize understanding of the Wound Healing Center Program Date Initiated: 02/11/2016 Goal Status: Active Interventions: Provide education on orientation to the wound center Notes: Pain, Acute or Chronic Nursing Diagnoses: Pain, acute or chronic: actual or potential Potential alteration in comfort, pain Goals: Patient will verbalize adequate pain control and receive pain control interventions during procedures as needed Date Initiated: 02/11/2016 Goal Status: Active Patient/caregiver will verbalize adequate pain control between visits Date Initiated: 02/11/2016 Goal Status: Active Interventions: Assess comfort goal upon admission Complete pain assessment as per visit requirements Notes: Soft Tissue Infection Nursing Diagnoses: Impaired tissue integrity Goals: Patient/caregiver will verbalize understanding of or measures to prevent infection and contamination in the home setting Date Initiated: 02/11/2016 Goal Status: Active Patient's soft tissue infection will resolve Date Initiated: 02/11/2016 Goal Status: Active Interventions: Assess signs and symptoms of infection every visit MATTEUS, MCNELLY ([id number]) Notes: Wound/Skin Impairment Nursing Diagnoses: Impaired tissue integrity Knowledge deficit related  to smoking impact on wound healing Goals: Patient will demonstrate a reduced rate of smoking or cessation of smoking Date Initiated: 02/11/2016 Goal Status:  Active Ulcer/skin breakdown will have a volume reduction of 30% by week 4 Date Initiated: 02/11/2016 Goal Status: Active Ulcer/skin breakdown will have a volume reduction of 50% by week 8 Date Initiated: 02/11/2016 Goal Status: Active Ulcer/skin breakdown will have a volume reduction of 80% by week 12 Date Initiated: 02/11/2016 Goal Status: Active Interventions: Assess ulceration(s) every visit Notes: Electronic Signature(s) Signed: 03/23/2016 12:00:13 PM By: Elpidio Eric BSN, RN Entered By: Elpidio Eric on 03/23/2016 09:50:49 Jaime Zuniga ([id number]) -------------------------------------------------------------------------------- Pain Assessment Details Patient Name: Jaime Zuniga Date of Service: 03/23/2016 9:15 AM Medical Record Number: [id number] Patient Account Number: [id number] Date of Birth/Sex: Sep 22, 1950 (65 y.o. Male) Treating RN: Curtis Sites Primary Care Physician: Evelene Croon Other Clinician: Referring Physician: Evelene Croon Treating Physician/Extender: Rudene Re in Treatment: 5 Active Problems Location of Pain Severity and Description of Pain Patient Has Paino No Site Locations Pain Management and Medication Current Pain Management: Notes Topical or injectable lidocaine is offered to patient for acute pain when surgical debridement is performed. If needed, Patient is instructed to use over the counter pain medication for the following 24-48 hours after debridement. Wound care MDs do not prescribed pain medications. Patient has chronic pain or uncontrolled pain. Patient has been instructed to make an appointment with their Primary Care Physician for pain management. Electronic Signature(s) Signed: 03/23/2016 4:58:15 PM By: Curtis Sites Entered By: Curtis Sites on 03/23/2016 09:27:45 Jaime Zuniga ([id number]) -------------------------------------------------------------------------------- Patient/Caregiver Education Details Patient Name:  Jaime Zuniga Date of Service: 03/23/2016 9:15 AM Medical Record Number: [id number] Patient Account Number: [id number] Date of Birth/Gender: February 17, 1951 (65 y.o. Male) Treating RN: Curtis Sites Primary Care Physician: Evelene Croon Other Clinician: Referring Physician: Evelene Croon Treating Physician/Extender: Elayne Snare in Treatment: 5 Education Assessment Education Provided To: Patient and Caregiver Education Topics Provided Wound/Skin Impairment: Handouts: Other: wound care as ordered Methods: Demonstration, Explain/Verbal Responses: State content correctly Electronic Signature(s) Signed: 03/24/2016 8:09:12 AM By: Ardath Sax MD Entered By: Ardath Sax on 03/23/2016 11:14:58 Jaime Zuniga ([id number]) -------------------------------------------------------------------------------- Wound Assessment Details Patient Name: Jaime Zuniga Date of Service: 03/23/2016 9:15 AM Medical Record Number: [id number] Patient Account Number: [id number] Date of Birth/Sex: 1951/06/02 (65 y.o. Male) Treating RN: Curtis Sites Primary Care Physician: Evelene Croon Other Clinician: Referring Physician: Evelene Croon Treating Physician/Extender: Elayne Snare in Treatment: 5 Wound Status Wound Number: 1 Primary Venous Leg Ulcer Etiology: Wound Location: Left Lower Leg - Anterior, Proximal Wound Open Status: Wounding Event: Gradually Appeared Comorbid Chronic Obstructive Pulmonary Disease Date Acquired: 01/26/2016 History: (COPD), Hypertension, Myocardial Weeks Of Treatment: 5 Infarction, Osteoarthritis, Dementia Clustered Wound: No Photos Wound Measurements Length: (cm) 0.7 Width: (cm) 0.3 Depth: (cm) 0.1 Area: (cm) 0.165 Volume: (cm) 0.016 % Reduction in Area: 86.4% % Reduction in Volume: 95.6% Epithelialization: None Tunneling: No Undermining: No Wound Description Full Thickness Without Exposed Classification: Support Structures Wound  Margin: Distinct, outline attached Exudate Large Amount: Exudate Type: Serosanguineous Exudate Color: red, brown Foul Odor After Cleansing: No Wound Bed Granulation Amount: Small (1-33%) Exposed Structure Granulation Quality: Pink Fascia Exposed: No Necrotic Amount: Large (67-100%) Fat Layer Exposed: No Michalik, Morgan ([id number]) Necrotic Quality: Eschar Tendon Exposed: No Muscle Exposed: No Joint Exposed: No Bone Exposed: No Periwound Skin Texture Texture Color No Abnormalities Noted: No No Abnormalities Noted: No Localized Edema: Yes Temperature / Pain Moisture Temperature: Cool/Cold No Abnormalities Noted: No Tenderness on Palpation: Yes  Dry / Scaly: Yes Maceration: No Moist: No Wound Preparation Ulcer Cleansing: Rinsed/Irrigated with Saline Topical Anesthetic Applied: Other: lidocaine 4% cream, Treatment Notes Wound #1 (Left, Proximal, Anterior Lower Leg) 1. Cleansed with: Clean wound with Normal Saline 2. Anesthetic Topical Lidocaine 4% cream to wound bed prior to debridement 4. Dressing Applied: Prisma Ag 5. Secondary Dressing Applied Guaze, ABD and kerlix/Conform 7. Secured with Secretary/administratorTape Electronic Signature(s) Signed: 03/23/2016 4:58:15 PM By: Curtis Sitesorthy, Joanna Entered By: Curtis Sitesorthy, Joanna on 03/23/2016 09:40:28 Jaime MaclachlanBURTON, Adarsh ([id number]) -------------------------------------------------------------------------------- Wound Assessment Details Patient Name: Jaime MaclachlanBURTON, Bonner Date of Service: 03/23/2016 9:15 AM Medical Record Number: [id number] Patient Account Number: [id number] Date of Birth/Sex: 1951/02/09 74(65 y.o. Male) Treating RN: Curtis Sitesorthy, Joanna Primary Care Physician: Evelene CroonNiemeyer, Meindert Other Clinician: Referring Physician: Evelene CroonNiemeyer, Meindert Treating Physician/Extender: Elayne SnarePARKER, PETER Weeks in Treatment: 5 Wound Status Wound Number: 2 Primary Venous Leg Ulcer Etiology: Wound Location: Left Lower Leg - Anterior, Distal Wound Open Status: Wounding Event:  Gradually Appeared Comorbid Chronic Obstructive Pulmonary Disease Date Acquired: 01/26/2016 History: (COPD), Hypertension, Myocardial Weeks Of Treatment: 5 Infarction, Osteoarthritis, Dementia Clustered Wound: No Photos Wound Measurements Length: (cm) 1.5 Width: (cm) 0.7 Depth: (cm) 0.1 Area: (cm) 0.825 Volume: (cm) 0.082 % Reduction in Area: 30% % Reduction in Volume: 65.3% Epithelialization: None Tunneling: No Undermining: No Wound Description Full Thickness Without Exposed Classification: Support Structures Wound Margin: Distinct, outline attached Exudate Large Amount: Exudate Type: Serosanguineous Exudate Color: red, brown Foul Odor After Cleansing: No Wound Bed Granulation Amount: Large (67-100%) Exposed Structure Granulation Quality: Red Fascia Exposed: No Necrotic Amount: Small (1-33%) Fat Layer Exposed: No Kopko, Juquan ([id number]) Necrotic Quality: Adherent Slough Tendon Exposed: No Muscle Exposed: No Joint Exposed: No Bone Exposed: No Periwound Skin Texture Texture Color No Abnormalities Noted: No No Abnormalities Noted: No Erythema: Yes Moisture Erythema Location: Circumferential No Abnormalities Noted: No Temperature / Pain Temperature: Cool/Cold Tenderness on Palpation: Yes Wound Preparation Ulcer Cleansing: Rinsed/Irrigated with Saline Topical Anesthetic Applied: Other: Lidocaine 4% Cream, Treatment Notes Wound #2 (Left, Distal, Anterior Lower Leg) 1. Cleansed with: Clean wound with Normal Saline 2. Anesthetic Topical Lidocaine 4% cream to wound bed prior to debridement 4. Dressing Applied: Prisma Ag 5. Secondary Dressing Applied Guaze, ABD and kerlix/Conform 7. Secured with Secretary/administratorTape Electronic Signature(s) Signed: 03/23/2016 4:58:15 PM By: Curtis Sitesorthy, Joanna Entered By: Curtis Sitesorthy, Joanna on 03/23/2016 09:40:48 Jaime MaclachlanBURTON, Pau ([id number]) -------------------------------------------------------------------------------- Wound Assessment  Details Patient Name: Jaime MaclachlanBURTON, Anais Date of Service: 03/23/2016 9:15 AM Medical Record Number: [id number] Patient Account Number: [id number] Date of Birth/Sex: 1951/02/09 70(65 y.o. Male) Treating RN: Curtis Sitesorthy, Joanna Primary Care Physician: Evelene CroonNiemeyer, Meindert Other Clinician: Referring Physician: Evelene CroonNiemeyer, Meindert Treating Physician/Extender: Elayne SnarePARKER, PETER Weeks in Treatment: 5 Wound Status Wound Number: 3 Primary Trauma, Other Etiology: Wound Location: Left Lower Leg - Lateral Wound Open Wounding Event: Trauma Status: Date Acquired: 03/23/2016 Comorbid Chronic Obstructive Pulmonary Disease Weeks Of Treatment: 0 History: (COPD), Hypertension, Myocardial Clustered Wound: No Infarction, Osteoarthritis, Dementia Photos Wound Measurements Length: (cm) 0.5 Width: (cm) 0.7 Depth: (cm) 0.1 Area: (cm) 0.275 Volume: (cm) 0.027 % Reduction in Area: % Reduction in Volume: Epithelialization: None Tunneling: No Undermining: No Wound Description Classification: Partial Thickness Wound Margin: Flat and Intact Exudate Amount: Medium Exudate Type: Serous Exudate Color: amber Wound Bed Granulation Amount: Large (67-100%) Exposed Structure Granulation Quality: Red Fascia Exposed: No Necrotic Amount: None Present (0%) Fat Layer Exposed: No Tendon Exposed: No Muscle Exposed: No Carillo, Venson ([id number]) Joint Exposed: No Bone Exposed: No Limited to Skin Breakdown Periwound Skin Texture Texture Color No Abnormalities Noted:  No No Abnormalities Noted: No Callus: No Atrophie Blanche: No Crepitus: No Cyanosis: No Excoriation: No Ecchymosis: No Fluctuance: No Erythema: No Friable: No Hemosiderin Staining: No Induration: No Mottled: No Localized Edema: No Pallor: No Rash: No Rubor: No Scarring: No Temperature / Pain Moisture Temperature: No Abnormality No Abnormalities Noted: No Dry / Scaly: No Maceration: No Moist: No Wound Preparation Ulcer Cleansing:  Rinsed/Irrigated with Saline Topical Anesthetic Applied: Other: lidocaine 4%, Treatment Notes Wound #3 (Left, Lateral Lower Leg) 1. Cleansed with: Clean wound with Normal Saline 2. Anesthetic Topical Lidocaine 4% cream to wound bed prior to debridement 4. Dressing Applied: Prisma Ag 5. Secondary Dressing Applied Guaze, ABD and kerlix/Conform 7. Secured with Secretary/administrator) Signed: 03/23/2016 4:58:15 PM By: Curtis Sites Entered By: Curtis Sites on 03/23/2016 09:39:51 Jaime Zuniga ([id number]) -------------------------------------------------------------------------------- Vitals Details Patient Name: Jaime Zuniga Date of Service: 03/23/2016 9:15 AM Medical Record Number: [id number] Patient Account Number: [id number] Date of Birth/Sex: [birth year] (65 y.o. Male) Treating RN: Curtis Sites Primary Care Physician: Evelene Croon Other Clinician: Referring Physician: Evelene Croon Treating Physician/Extender: Rudene Re in Treatment: 5 Vital Signs Time Taken: 09:27 Temperature (F): 97.8 Height (in): 72 Pulse (bpm): 63 Weight (lbs): 210 Respiratory Rate (breaths/min): 18 Body Mass Index (BMI): 28.5 Blood Pressure (mmHg): 93/59 Reference Range: 80 - 120 mg / dl Electronic Signature(s) Signed: 03/23/2016 4:58:15 PM By: Curtis Sites Entered By: Curtis Sites on 03/23/2016 09:28:09

## 2016-03-24 NOTE — Progress Notes (Signed)
GOVANI, RADLOFF ([id number]) Visit Report for 03/23/2016 Chief Complaint Document Details Patient Name: Jaime Zuniga, Jaime Zuniga Date of Service: 03/23/2016 9:15 AM Medical Record Number: [id number] Patient Account Number: [id number] Date of Birth/Sex: 04-10-1951 (65 y.o. 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 Male) Treating RN: Clover Mealy, RN, BSN, Rita Primary Care Physician: Evelene Croon Other Clinician: Referring Physician: Evelene Croon Treating Physician/Extender: Ardath Sax Weeks in Treatment: 5 Debridement Performed for Wound #1 Left,Proximal,Anterior Lower Leg Assessment: Performed By: Physician Ardath Sax, MD Debridement: Debridement Pre-procedure Yes Verification/Time Out Taken: Start Time: 09:50 Pain Control: Lidocaine 4% Topical Solution Level: Skin/Subcutaneous Tissue Total Area Debrided (L x 0.7 (cm) x 0.3 (cm) = 0.21 (cm) W): Tissue and other Fibrin/Slough, Subcutaneous material debrided: Instrument: Curette Bleeding: Minimum Hemostasis Achieved: Pressure End Time: 09:52 Procedural Pain: 0 Post Procedural Pain: 0 Response to Treatment: Procedure was tolerated well Post  Debridement Measurements of Total Wound Length: (cm) 0.7 Width: (cm) 0.3 Depth: (cm) 0.2 Volume: (cm) 0.033 Post Procedure Diagnosis Same as Pre-procedure Electronic Signature(s) Signed: 03/23/2016 12:00:13 PM By: Elpidio Eric BSN, RN Signed: 03/24/2016 8:09:12 AM By: Ardath Sax MD Entered By: Elpidio Eric on 03/23/2016 09:53:52 Jaime Zuniga ([id number]) -------------------------------------------------------------------------------- HPI Details Patient Name: Jaime Zuniga Date of Service: 03/23/2016 9:15 AM Medical Record Number: [id number] Patient Account Number: [id number] Date of Birth/Sex: 1950/09/19 (65 y.o. Male) Treating RN: Curtis Sites Primary Care Physician: Evelene Croon Other Clinician: Referring Physician: Evelene Croon Treating Physician/Extender: Ardath Sax Weeks in Treatment: 5 History of Present Illness Location: left lower extremity anterior shin area Quality: Patient reports experiencing a dull pain to affected area(s). Severity: Patient states wound are getting worse. Duration: Patient has had the wound for > 3 months prior to seeking treatment at the wound center Timing: Pain in wound is Intermittent (comes and goes Context: The wound appeared gradually over time Modifying Factors: Other treatment(s) tried include:Augmentin and doxycycline Associated Signs and Symptoms: Patient reports having increase swelling. HPI Description: 65 year old gentleman was being seen in the ER recently for wounds on his left lower extremity which have been draining and there is swelling and he was concerned about wound infection. in the ER x-ray of the left leg was done which showed postsurgical changes but no acute findings.he wrapped his leg in and Unna's boots and referred him to the wound center. earlier in June he had a DVT study done of the left lower extremity which showed no evidence of DVT but there was edema seen. no reflux was seen on that study. in  June he was seen by a surgeon Dr. Lemar Livings who advised symptomatic treatment. the patient was also seen at the [facility] he has recently completed courses of Augmentin and doxycycline. 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 02/24/2016 -- seen by Dr. Kirke Corin on 02/17/2016. Lower extremity arterial studies  were done which showed a normal ABI bilaterally and normal great toe pressures. Duplex showed diffuse nonobstructive disease and a three-vessel runoff bilaterally. No further vascular studies or intervention was recommended. He was seen by Dr. Romilda Joy on 02/17/2016 and recommended to continue with wound care and no vascular studies intervention was recommended. Electronic Signature(s) Signed: 03/23/2016 11:11:49 AM By: Ardath Sax MD Entered By: Ardath Sax on 03/23/2016 11:11:49 Jaime Zuniga ([id number]) -------------------------------------------------------------------------------- Physical Exam Details Patient Name: Jaime Zuniga Date of Service: 03/23/2016 9:15 AM Medical Record Number: [id number] Patient Account Number: [id number] Date of Birth/Sex: 14-May-1951 (65 y.o. Male) Treating RN: Curtis Sites Primary Care Physician: Evelene Croon Other Clinician: Referring Physician: Evelene Croon Treating Physician/Extender: Elayne Snare in Treatment: 5 Electronic Signature(s) Signed: 03/23/2016 11:12:00 AM By: Ardath Sax MD Entered By: Ardath Sax on 03/23/2016 11:12:00 Jaime Zuniga ([id number]) -------------------------------------------------------------------------------- Physician Orders Details Patient Name: Jaime Zuniga Date of Service: 03/23/2016 9:15 AM Medical Record Number: [id number] Patient Account Number: [id number] Date of Birth/Sex: October 04, 1950 (65 y.o. Male) Treating RN: Clover Mealy, RN, BSN, [place] Sink Primary Care Physician:  Evelene Croon Other Clinician: Referring Physician: Evelene Croon Treating Physician/Extender: Elayne Snare in Treatment: 5 Verbal / Phone Orders: Yes Clinician: Afful, RN, BSN, Rita Read Back and Verified: Yes Diagnosis Coding Wound Cleansing Wound #1 Left,Proximal,Anterior Lower Leg o Clean wound with Normal Saline. o Cleanse wound with mild soap and water o May Shower, gently pat wound dry prior to applying new dressing. Wound #3 Left,Lateral Lower Leg o Clean wound with Normal Saline. o Cleanse wound with mild soap and water o May Shower, gently pat wound dry prior to applying new dressing. Wound #2 Left,Distal,Anterior Lower Leg o Clean wound with Normal Saline. o Cleanse wound with mild soap and water o May Shower, gently pat wound dry prior to applying new dressing. Anesthetic Wound #1 Left,Proximal,Anterior Lower Leg o Topical Lidocaine 4% cream applied to wound bed prior to debridement - for clinic use Wound #2 Left,Distal,Anterior Lower Leg o Topical Lidocaine 4% cream applied to wound bed prior to debridement - for clinic use Wound #3 Left,Lateral Lower Leg o Topical Lidocaine 4% cream applied to wound bed prior to debridement - for clinic use Skin Barriers/Peri-Wound Care Wound #1 Left,Proximal,Anterior Lower Leg o Barrier cream Wound #3 Left,Lateral Lower Leg o Barrier cream Wound #2 Left,Distal,Anterior Lower Leg o Barrier cream Primary Wound Dressing Lesniak, Lam ([id number]) Wound #1 Left,Proximal,Anterior Lower Leg o Prisma Ag - please moisten with saline Wound #3 Left,Lateral Lower Leg o Prisma Ag - please moisten with saline Wound #2 Left,Distal,Anterior Lower Leg o Prisma Ag - please moisten with saline Secondary Dressing Wound #1 Left,Proximal,Anterior Lower Leg o ABD pad o Dry Gauze - netting Wound #3 Left,Lateral Lower Leg o ABD pad o Dry Gauze - netting Wound #2 Left,Distal,Anterior  Lower Leg o ABD pad o Dry Gauze - netting Dressing Change Frequency Wound #1 Left,Proximal,Anterior Lower Leg o Change dressing every other day. Wound #2 Left,Distal,Anterior Lower Leg o Change dressing every other day. Follow-up Appointments Wound #1 Left,Proximal,Anterior Lower Leg o Return Appointment in 1 week. Wound #2 Left,Distal,Anterior Lower Leg o Return Appointment in 1 week. Edema Control Wound #1 Left,Proximal,Anterior Lower Leg o Elevate legs to the level of the heart and pump ankles as often as possible Wound #2 Left,Distal,Anterior Lower Leg o Elevate legs to the level of the heart and pump ankles as often as possible Additional Orders / Instructions Wound #1 Left,Proximal,Anterior Lower Leg o Stop Smoking o Increase protein intake.  Jaime MaclachlanBURTON, Jaime Zuniga ([id number]) Wound #2 Left,Distal,Anterior Lower Leg o Stop Smoking o Increase protein intake. Home Health Wound #1 Left,Proximal,Anterior Lower Leg o Continue Home Health Visits - [facility] o Home Health Nurse may visit PRN to address patientos wound care needs. o FACE TO FACE ENCOUNTER: MEDICARE and MEDICAID PATIENTS: I certify that this patient is under my care and that I had a face-to-face encounter that meets the physician face-to-face encounter requirements with this patient on this date. The encounter with the patient was in whole or in part for the following MEDICAL CONDITION: (primary reason for Home Healthcare) MEDICAL NECESSITY: I certify, that based on my findings, NURSING services are a medically necessary home health service. HOME BOUND STATUS: I certify that my clinical findings support that this patient is homebound (i.e., Due to illness or injury, pt requires aid of supportive devices such as crutches, cane, wheelchairs, walkers, the use of special transportation or the assistance of another person to leave their place of residence. There is a normal inability to leave the  home and doing so requires considerable and taxing effort. Other absences are for medical reasons / religious services and are infrequent or of short duration when for other reasons). o If current dressing causes regression in wound condition, may D/C ordered dressing product/s and apply Normal Saline Moist Dressing daily until next Wound Healing Center / Other MD appointment. Notify Wound Healing Center of regression in wound condition at [phone]. o Please direct any NON-WOUND related issues/requests for orders to patient's Primary Care Physician Wound #2 Left,Distal,Anterior Lower Leg o Continue Home Health Visits - [facility] o Home Health Nurse may visit PRN to address patientos wound care needs. o FACE TO FACE ENCOUNTER: MEDICARE and MEDICAID PATIENTS: I certify that this patient is under my care and that I had a face-to-face encounter that meets the physician face-to-face encounter requirements with this patient on this date. The encounter with the patient was in whole or in part for the following MEDICAL CONDITION: (primary reason for Home Healthcare) MEDICAL NECESSITY: I certify, that based on my findings, NURSING services are a medically necessary home health service. HOME BOUND STATUS: I certify that my clinical findings support that this patient is homebound (i.e., Due to illness or injury, pt requires aid of supportive devices such as crutches, cane, wheelchairs, walkers, the use of special transportation or the assistance of another person to leave their place of residence. There is a normal inability to leave the home and doing so requires considerable and taxing effort. Other absences are for medical reasons / religious services and are infrequent or of short duration when for other reasons). o If current dressing causes regression in wound condition, may D/C ordered dressing product/s and apply Normal Saline Moist Dressing daily until next Wound Healing Center /  Other MD appointment. Notify Wound Healing Center of regression in wound condition at [phone]. o Please direct any NON-WOUND related issues/requests for orders to patient's Primary Care Physician Medications-please add to medication list. Jaime MaclachlanBURTON, Jaime Zuniga ([id number]) Wound #1 Left,Proximal,Anterior Lower Leg o Other: - vitamin C, zinc, multivitamin Wound #2 Left,Distal,Anterior Lower Leg o Other: - vitamin C, zinc, multivitamin Electronic Signature(s) Signed: 03/23/2016 12:00:13 PM By: Elpidio EricAfful, Rita BSN, RN Signed: 03/24/2016 8:09:12 AM By: Ardath SaxParker, Lorrena Goranson MD Entered By: Elpidio EricAfful, Rita on 03/23/2016 09:52:08 Jaime MaclachlanBURTON, Jaime Zuniga ([id number]) -------------------------------------------------------------------------------- Problem List Details Patient Name: Jaime MaclachlanBURTON, Jaime Zuniga Date of Service: 03/23/2016 9:15 AM Medical Record Number: [id number] Patient Account Number: [id number] Date of Birth/Sex: [birth year](65 y.o. 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 Severity: Patient states wound are getting worse. Duration: Patient has had the wound for > 3 months prior to seeking treatment at the wound center Timing: Pain in wound is Intermittent (comes and goes Context: The wound appeared gradually over time Modifying Factors: Other treatment(s) tried include:Augmentin and doxycycline Associated Signs and Symptoms: Patient reports having increase swelling. 65 year old gentleman was being seen in the ER recently for wounds on his left lower extremity which have been draining and there is swelling and he was concerned about wound infection. in the ER x-ray of the left leg was done which showed postsurgical changes but no acute findings.he wrapped his leg in and Unna's boots and referred him to the wound center. earlier in June he had a DVT study done of the left lower extremity which showed no evidence of DVT but there was edema seen. no reflux was seen on that study. in June he was seen by a surgeon Dr. Lemar Livings who advised symptomatic treatment. the patient was also seen at the [facility] he has recently completed courses of Augmentin and doxycycline. 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 02/24/2016 -- seen by Dr. Kirke Corin on 02/17/2016.  Lower extremity arterial studies were done which showed a normal ABI bilaterally and normal great toe pressures. Duplex showed diffuse nonobstructive disease and a three-vessel runoff bilaterally. No further vascular studies or intervention was recommended. He was seen by Dr. Romilda Joy on 02/17/2016 and recommended to continue with wound care and no vascular studies intervention was recommended. [employer], Irene Limbo ([id number]) Objective Constitutional Vitals Time Taken: 9:27 AM, Height: 72 in, Weight: 210 lbs, BMI: 28.5, Temperature: 97.8 F, Pulse: 63 bpm, Respiratory Rate: 18 breaths/min, Blood Pressure: 93/59 mmHg. Integumentary (Hair, Skin) Wound #1 status is Open. Original cause of wound was Gradually Appeared. The wound is located on the Left,Proximal,Anterior Lower Leg. The wound measures 0.7cm length x 0.3cm width x 0.1cm depth; 0.165cm^2 area and 0.016cm^3 volume. There is no tunneling or undermining noted. There is a large amount of serosanguineous drainage noted. The wound margin is distinct with the outline attached to the wound base. There is small (1-33%) pink granulation within the wound bed. There is a large (67-100%)  amount of necrotic tissue within the wound bed including Eschar. The periwound skin appearance exhibited: Localized Edema, Dry/Scaly. The periwound skin appearance did not exhibit: Maceration, Moist. Periwound temperature was noted as Cool/Cold. The periwound has tenderness on palpation. Wound #2 status is Open. Original cause of wound was Gradually Appeared. The wound is located on the [facility] Lower Leg. The wound measures 1.5cm length x 0.7cm width x 0.1cm depth; 0.825cm^2 area and 0.082cm^3 volume. There is no tunneling or undermining noted. There is a large amount of serosanguineous drainage noted. The wound margin is distinct with the outline attached to the wound base. There is large (67-100%) red granulation within the wound bed. There is a  small (1-33%) amount of necrotic tissue within the wound bed including Adherent Slough. The periwound skin appearance exhibited: Erythema. The surrounding wound skin color is noted with erythema which is circumferential. Periwound temperature was noted as Cool/Cold. The periwound has tenderness on palpation. Wound #3 status is Open. Original cause of wound was Trauma. The wound is located on the Left,Lateral Lower Leg. The wound measures 0.5cm length x 0.7cm width x 0.1cm depth; 0.275cm^2 area and 0.027cm^3 volume. The wound is limited to skin breakdown. There is no tunneling or undermining noted. There is a medium amount of serous drainage noted. The wound margin is flat and intact. There is large (67-100%) red granulation within the wound bed. There is no necrotic tissue within the wound bed. The periwound skin appearance did not exhibit: Callus, Crepitus, Excoriation, Fluctuance, Friable, Induration, Localized Edema, Rash, Scarring, Dry/Scaly, Maceration, Moist, Atrophie Blanche, Cyanosis, Ecchymosis, Hemosiderin Staining, Mottled, Pallor, Rubor, Erythema. Periwound temperature was noted as No Abnormality. 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 There was a Skin/Subcutaneous Tissue Debridement (40981-19147) debridement with total area of 0.21 sq cm performed by Ardath Sax, MD. with the following instrument(s): Curette including Fibrin/Slough and Subcutaneous after achieving pain control using Lidocaine 4% Topical Solution. A time out was conducted prior to the start of the procedure. A Minimum amount of bleeding was controlled with Pressure. The procedure was tolerated well with a pain  level of 0 throughout and a pain level of 0 following the procedure. Post Debridement Measurements: 0.7cm length x 0.3cm width x 0.2cm depth; 0.033cm^3 volume. Post procedure Diagnosis Wound #1: Same as Pre-Procedure Debrided necrotic material from wounds left leg. Dress with Prisma daily Plan Wound Cleansing: Wound #1 Left,Proximal,Anterior Lower Leg: Clean wound with Normal Saline. Cleanse wound with mild soap and water May Shower, gently pat wound dry prior to applying new dressing. Wound #3 Left,Lateral Lower Leg: Clean wound with Normal Saline. Cleanse wound with mild soap and water May Shower, gently pat wound dry prior to applying new dressing. Wound #2 Left,Distal,Anterior Lower Leg: Clean wound with Normal Saline. Cleanse wound with mild soap and water May Shower, gently pat wound dry prior to applying new dressing. Anesthetic: Wound #1 Left,Proximal,Anterior Lower Leg: Topical Lidocaine 4% cream applied to wound bed prior to debridement - for clinic use Wound #2 Left,Distal,Anterior Lower Leg: Topical Lidocaine 4% cream applied to wound bed prior to debridement - for clinic use Wound #3 Left,Lateral Lower Leg: Topical Lidocaine 4% cream applied to wound bed prior to debridement - for clinic use Skin Barriers/Peri-Wound Care: Wound #  1 Left,Proximal,Anterior Lower Leg: Barrier cream Wound #3 Left,Lateral Lower Leg: Barrier cream Jaime Zuniga, Jaime Zuniga ([id number]) Wound #2 Left,Distal,Anterior Lower Leg: Barrier cream Primary Wound Dressing: Wound #1 Left,Proximal,Anterior Lower Leg: Prisma Ag - please moisten with saline Wound #3 Left,Lateral Lower Leg: Prisma Ag - please moisten with saline Wound #2 Left,Distal,Anterior Lower Leg: Prisma Ag - please moisten with saline Secondary Dressing: Wound #1 Left,Proximal,Anterior Lower Leg: ABD pad Dry Gauze - netting Wound #3 Left,Lateral Lower Leg: ABD pad Dry Gauze - netting Wound #2 Left,Distal,Anterior Lower Leg: ABD  pad Dry Gauze - netting Dressing Change Frequency: Wound #1 Left,Proximal,Anterior Lower Leg: Change dressing every other day. Wound #2 Left,Distal,Anterior Lower Leg: Change dressing every other day. Follow-up Appointments: Wound #1 Left,Proximal,Anterior Lower Leg: Return Appointment in 1 week. Wound #2 Left,Distal,Anterior Lower Leg: Return Appointment in 1 week. Edema Control: Wound #1 Left,Proximal,Anterior Lower Leg: Elevate legs to the level of the heart and pump ankles as often as possible Wound #2 Left,Distal,Anterior Lower Leg: Elevate legs to the level of the heart and pump ankles as often as possible Additional Orders / Instructions: Wound #1 Left,Proximal,Anterior Lower Leg: Stop Smoking Increase protein intake. Wound #2 Left,Distal,Anterior Lower Leg: Stop Smoking Increase protein intake. Home Health: Wound #1 Left,Proximal,Anterior Lower Leg: [facility] Home Health Nurse may visit PRN to address patient s wound care needs. FACE TO FACE ENCOUNTER: MEDICARE and MEDICAID PATIENTS: I certify that this patient is under my care and that I had a face-to-face encounter that meets the physician face-to-face encounter requirements with this patient on this date. The encounter with the patient was in whole or in part for the following MEDICAL CONDITION: (primary reason for Home Healthcare) MEDICAL NECESSITY: I certify, that based on my findings, NURSING services are a medically necessary home health service. HOME BOUND STATUS: I certify that my clinical findings support that this patient is homebound (i.e., Due to [place] ([id number]) illness or injury, pt requires aid of supportive devices such as crutches, cane, wheelchairs, walkers, the use of special transportation or the assistance of another person to leave their place of residence. There is a normal inability to leave the home and doing so requires considerable and taxing effort. Other  absences are for medical reasons / religious services and are infrequent or of short duration when for other reasons). If current dressing causes regression in wound condition, may D/C ordered dressing product/s and apply Normal Saline Moist Dressing daily until next Wound Healing Center / Other MD appointment. Notify Wound Healing Center of regression in wound condition at [phone]. Please direct any NON-WOUND related issues/requests for orders to patient's Primary Care Physician Wound #2 Left,Distal,Anterior Lower Leg: [facility] Nurse may visit PRN to address patient s wound care needs. FACE TO FACE ENCOUNTER: MEDICARE and MEDICAID PATIENTS: I certify that this patient is under my care and that I had a face-to-face encounter that meets the physician face-to-face encounter requirements with this patient on this date. The encounter with the patient was in whole or in part for the following MEDICAL CONDITION: (primary reason for Home Healthcare) MEDICAL NECESSITY: I certify, that based on my findings, NURSING services are a medically necessary home health service. HOME BOUND STATUS: I certify that my clinical findings support that this patient is homebound (i.e., Due to illness or injury, pt requires aid of supportive devices such as crutches, cane, wheelchairs, walkers, the use of special transportation or the assistance of another person to  leave their place of residence. There is a normal inability to leave the home and doing so requires considerable and taxing effort. Other absences are for medical reasons / religious services and are infrequent or of short duration when for other reasons). If current dressing causes regression in wound condition, may D/C ordered dressing product/s and apply Normal Saline Moist Dressing daily until next Wound Healing Center / Other MD appointment. Notify Wound Healing Center of regression in wound condition at  [phone]. Please direct any NON-WOUND related issues/requests for orders to patient's Primary Care Physician Medications-please add to medication list.: Wound #1 Left,Proximal,Anterior Lower Leg: Other: - vitamin C, zinc, multivitamin Wound #2 Left,Distal,Anterior Lower Leg: Other: - vitamin C, zinc, multivitamin Follow-Up Appointments: A follow-up appointment should be scheduled. A Patient Clinical Summary of Care was provided to [facility] Electronic Signature(s) Signed: 03/23/2016 11:13:36 AM By: Ardath Sax MD Entered By: Ardath Sax on 03/23/2016 11:13:35 Jaime Zuniga ([id number]) -------------------------------------------------------------------------------- SuperBill Details Patient Name: Jaime Zuniga Date of Service: 03/23/2016 Medical Record Number: [id number] Patient Account Number: [id number] Date of Birth/Sex: 1950/10/26 (65 y.o. Male) Treating RN: Curtis Sites Primary Care Physician: Evelene Croon Other Clinician: Referring Physician: Evelene Croon Treating Physician/Extender: Elayne Snare in Treatment: 5 Diagnosis Coding ICD-10 Codes Code Description [phone] Non-pressure chronic ulcer of left calf with fat layer exposed M61.462 Other calcification of muscle, left lower leg I73.9 Peripheral vascular disease, unspecified Facility Procedures CPT4 Code: [id number] Description: 11042 - DEB SUBQ TISSUE 20 SQ CM/< ICD-10 Description Diagnosis I73.9 Peripheral vascular disease, unspecified Modifier: Quantity: 1 Physician Procedures CPT4 Code: 6063016 Description: 99213 - WC PHYS LEVEL 3 - EST PT ICD-10 Description Diagnosis L97.222 Non-pressure chronic ulcer of left calf with fat l Modifier: ayer exposed Quantity: 1 CPT4 Code: 0109323 Description: 11042 - WC PHYS SUBQ TISS 20 SQ CM ICD-10 Description Diagnosis I73.9 Peripheral vascular disease, unspecified Modifier: Quantity: 1 Electronic Signature(s) Signed: 03/23/2016 11:14:13 AM By: Ardath Sax  MD Entered By: Ardath Sax on 03/23/2016 11:14:13

## 2016-03-30 ENCOUNTER — Encounter: Payer: Medicare Other | Admitting: Surgery

## 2016-03-30 DIAGNOSIS — L97222 Non-pressure chronic ulcer of left calf with fat layer exposed: Secondary | ICD-10-CM | POA: Diagnosis not present

## 2016-03-31 NOTE — Progress Notes (Signed)
Jaime Zuniga ([id number]) Visit Report for 03/30/2016 Chief Complaint Document Details Patient Name: Jaime Zuniga, Jaime Zuniga Date of Service: 03/30/2016 10:00 AM Medical Record Number: [id number] Patient Account Number: [id number] Date of Birth/Sex: [birth year] (65 y.o. Male) Treating RN: Huel Coventry Primary Care Physician: Evelene Croon Other Clinician: Referring Physician: Evelene Croon Treating Physician/Extender: Rudene Re in Treatment: 6 Information Obtained from: Patient Chief Complaint Patient visit today for follow-up of arterial ulceration to his left anterior lower leg where he's had previous injury and this has been there for several months Electronic Signature(s) Signed: 03/30/2016 11:06:55 AM By: Evlyn Kanner MD, FACS Entered By: Evlyn Kanner on 03/30/2016 11:06:55 Jaime Zuniga ([id number]) -------------------------------------------------------------------------------- HPI Details Patient Name: Jaime Zuniga Date of Service: 03/30/2016 10:00 AM Medical Record Number: [id number] Patient Account Number: [id number] Date of Birth/Sex: 1950-12-24 (65 y.o. Male) Treating RN: Huel Coventry Primary Care Physician: Evelene Croon Other Clinician: Referring Physician: Evelene Croon Treating Physician/Extender: Rudene Re in Treatment: 6 History of Present Illness Location: left lower extremity anterior shin area Quality: Patient reports experiencing a dull pain to affected area(s). Severity: Patient states wound are getting worse. Duration: Patient has had the wound for > 3 months prior to seeking treatment at the wound center Timing: Pain in wound is Intermittent (comes and goes Context: The wound appeared gradually over time Modifying Factors: Other treatment(s) tried include:Augmentin and doxycycline Associated Signs and Symptoms: Patient reports having increase swelling. HPI Description: 65 year old gentleman was being seen in the ER recently for  wounds on his left lower extremity which have been draining and there is swelling and he was concerned about wound infection. in the ER x-ray of the left leg was done which showed postsurgical changes but no acute findings.he wrapped his leg in and Unna's boots and referred him to the wound center. earlier in June he had a DVT study done of the left lower extremity which showed no evidence of DVT but there was edema seen. no reflux was seen on that study. in June he was seen by a surgeon Dr. Lemar Livings who advised symptomatic treatment. the patient was also seen at the [facility] he has recently completed courses of Augmentin and doxycycline. 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 02/24/2016 -- seen by Dr. Kirke Corin on 02/17/2016. Lower extremity arterial studies were done which showed a normal ABI bilaterally and normal great toe pressures. Duplex showed diffuse nonobstructive disease and a three-vessel runoff bilaterally. No further vascular studies or intervention was recommended. He was seen by Dr. Romilda Joy on 02/17/2016 and recommended to continue with wound care and no vascular studies intervention was recommended. Electronic Signature(s) Signed: 03/30/2016 11:07:01 AM By: Evlyn Kanner MD, FACS Entered By: Evlyn Kanner on 03/30/2016 11:07:01 Jaime Zuniga ([id number]) -------------------------------------------------------------------------------- Physical Exam Details Patient Name: Jaime Zuniga Date of Service: 03/30/2016 10:00 AM Medical Record Number: [id number] Patient Account Number: [id number] Date of Birth/Sex: Dec 28, 1950 (65 y.o. Male) Treating RN: Huel Coventry Primary Care Physician: Evelene Croon Other Clinician: Referring Physician: Evelene Croon Treating Physician/Extender: Rudene Re in Treatment: 6 Constitutional . Pulse regular.  Respirations normal and unlabored. Afebrile. . Eyes Nonicteric. Reactive to light. Ears, Nose, Mouth, and Throat Lips, teeth, and gums WNL.Marland Kitchen Moist mucosa without lesions. Neck supple and nontender. No palpable supraclavicular or cervical adenopathy. Normal sized without goiter. Respiratory WNL. No retractions.. Cardiovascular Pedal Pulses WNL. No clubbing, cyanosis or edema. Lymphatic  No adneopathy. No adenopathy. No adenopathy. Musculoskeletal Adexa without tenderness or enlargement.. Digits and nails w/o clubbing, cyanosis, infection, petechiae, ischemia, or inflammatory conditions.. Integumentary (Hair, Skin) No suspicious lesions. No crepitus or fluctuance. No peri-wound warmth or erythema. No masses.Marland Kitchen Psychiatric Judgement and insight Intact.. No evidence of depression, anxiety, or agitation.. Notes continues to have an open wound with minimal undermining and sharp debridement was not required today. I was able to remove some debris with moist saline gauze. Electronic Signature(s) Signed: 03/30/2016 11:07:33 AM By: Evlyn Kanner MD, FACS Entered By: Evlyn Kanner on 03/30/2016 11:07:32 Jaime Zuniga ([id number]) -------------------------------------------------------------------------------- Physician Orders Details Patient Name: Jaime Zuniga Date of Service: 03/30/2016 10:00 AM Medical Record Number: [id number] Patient Account Number: [id number] Date of Birth/Sex: [birth year] (65 y.o. Male) Treating RN: Clover Mealy, RN, BSN, [place] Sink Primary Care Physician: Evelene Croon Other Clinician: Referring Physician: Evelene Croon Treating Physician/Extender: Rudene Re in Treatment: 6 Verbal / Phone Orders: Yes Clinician: Afful, RN, BSN, Rita Read Back and Verified: Yes Diagnosis Coding Wound Cleansing Wound #2 Left,Distal,Anterior Lower Leg o Clean wound with Normal Saline. o Cleanse wound with mild soap and water o May Shower, gently pat wound dry prior to  applying new dressing. Anesthetic Wound #2 Left,Distal,Anterior Lower Leg o Topical Lidocaine 4% cream applied to wound bed prior to debridement - for clinic use Skin Barriers/Peri-Wound Care Wound #2 Left,Distal,Anterior Lower Leg o Barrier cream Primary Wound Dressing Wound #2 Left,Distal,Anterior Lower Leg o Prisma Ag - please moisten with saline Secondary Dressing Wound #2 Left,Distal,Anterior Lower Leg o ABD pad o Dry Gauze - netting Dressing Change Frequency Wound #2 Left,Distal,Anterior Lower Leg o Change dressing every other day. Follow-up Appointments Wound #2 Left,Distal,Anterior Lower Leg o Return Appointment in 1 week. Edema Control Wound #2 Left,Distal,Anterior Lower Leg o Elevate legs to the level of the heart and pump ankles as often as possible Jaime Zuniga, Jaime Zuniga ([id number]) Additional Orders / Instructions Wound #2 Left,Distal,Anterior Lower Leg o Stop Smoking o Increase protein intake. Home Health Wound #2 Left,Distal,Anterior Lower Leg o Continue Home Health Visits - [facility] o Home Health Nurse may visit PRN to address patientos wound care needs. o FACE TO FACE ENCOUNTER: MEDICARE and MEDICAID PATIENTS: I certify that this patient is under my care and that I had a face-to-face encounter that meets the physician face-to-face encounter requirements with this patient on this date. The encounter with the patient was in whole or in part for the following MEDICAL CONDITION: (primary reason for Home Healthcare) MEDICAL NECESSITY: I certify, that based on my findings, NURSING services are a medically necessary home health service. HOME BOUND STATUS: I certify that my clinical findings support that this patient is homebound (i.e., Due to illness or injury, pt requires aid of supportive devices such as crutches, cane, wheelchairs, walkers, the use of special transportation or the assistance of another person to leave their place of residence.  There is a normal inability to leave the home and doing so requires considerable and taxing effort. Other absences are for medical reasons / religious services and are infrequent or of short duration when for other reasons). o If current dressing causes regression in wound condition, may D/C ordered dressing product/s and apply Normal Saline Moist Dressing daily until next Wound Healing Center / Other MD appointment. Notify Wound Healing Center of regression in wound condition at [phone]. o Please direct any NON-WOUND related issues/requests for orders to patient's Primary Care Physician Medications-please add to medication list. Wound #2 Left,Distal,Anterior Lower Leg o Other: -  vitamin C, zinc, multivitamin Electronic Signature(s) Signed: 03/30/2016 4:46:57 PM By: Evlyn Kanner MD, FACS Signed: 03/30/2016 5:25:53 PM By: Elpidio Eric BSN, RN Entered By: Elpidio Eric on 03/30/2016 10:41:57 Jaime Zuniga ([id number]) -------------------------------------------------------------------------------- Problem List Details Patient Name: Jaime Zuniga Date of Service: 03/30/2016 10:00 AM Medical Record Number: [id number] Patient Account Number: [id number] Date of Birth/Sex: 1951/06/27 (65 y.o. Male) Treating RN: Huel Coventry Primary Care Physician: Evelene Croon Other Clinician: Referring Physician: Evelene Croon Treating Physician/Extender: Rudene Re in Treatment: 6 Active Problems ICD-10 Encounter Code Description Active Date Diagnosis L97.222 Non-pressure chronic ulcer of left calf with fat layer 02/11/2016 Yes exposed M61.462 Other calcification of muscle, left lower leg 02/11/2016 Yes I73.9 Peripheral vascular disease, unspecified 02/11/2016 Yes Inactive Problems Resolved Problems Electronic Signature(s) Signed: 03/30/2016 11:06:39 AM By: Evlyn Kanner MD, FACS Entered By: Evlyn Kanner on 03/30/2016 11:06:39 Jaime Zuniga  ([id number]) -------------------------------------------------------------------------------- Progress Note Details Patient Name: Jaime Zuniga Date of Service: 03/30/2016 10:00 AM Medical Record Number: [id number] Patient Account Number: [id number] Date of Birth/Sex: [birth year] (65 y.o. Male) Treating RN: Huel Coventry Primary Care Physician: Evelene Croon Other Clinician: Referring Physician: Evelene Croon Treating Physician/Extender: Rudene Re in Treatment: 6 Subjective Chief Complaint Information obtained from Patient Patient visit today for follow-up of arterial ulceration to his left anterior lower leg where he's had previous injury and this has been there for several months History of Present Illness (HPI) The following HPI elements were documented for the patient's wound: Location: left lower extremity anterior shin area Quality: Patient reports experiencing a dull pain to affected area(s). Severity: Patient states wound are getting worse. Duration: Patient has had the wound for > 3 months prior to seeking treatment at the wound center Timing: Pain in wound is Intermittent (comes and goes Context: The wound appeared gradually over time Modifying Factors: Other treatment(s) tried include:Augmentin and doxycycline Associated Signs and Symptoms: Patient reports having increase swelling. 65 year old gentleman was being seen in the ER recently for wounds on his left lower extremity which have been draining and there is swelling and he was concerned about wound infection. in the ER x-ray of the left leg was done which showed postsurgical changes but no acute findings.he wrapped his leg in and Unna's boots and referred him to the wound center. earlier in June he had a DVT study done of the left lower extremity which showed no evidence of DVT but there was edema seen. no reflux was seen on that study. in June he was seen by a surgeon Dr. Lemar Livings who advised symptomatic  treatment. the patient was also seen at the [facility] he has recently completed courses of Augmentin and doxycycline. 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 02/24/2016 -- seen by Dr. Kirke Corin on 02/17/2016. Lower extremity arterial studies were done which showed a normal ABI bilaterally and normal great toe pressures. Duplex showed diffuse nonobstructive disease and a three-vessel runoff bilaterally. No further vascular studies or intervention was recommended. He was seen by Dr. Romilda Joy on 02/17/2016 and recommended to continue with wound care and no vascular studies intervention was recommended. Jaime Zuniga, Jaime Zuniga ([id number]) Objective Constitutional Pulse regular. Respirations normal and unlabored. Afebrile. Vitals Time Taken: 10:10 AM, Height: 72 in, Weight: 210 lbs, BMI: 28.5, Temperature: 97.8 F, Pulse: 61 bpm, Respiratory Rate: 18 breaths/min, Blood Pressure: 89/58 mmHg. Eyes Nonicteric. Reactive to light. Ears, Nose, Mouth, and Throat  Lips, teeth, and gums WNL.Marland Kitchen Moist mucosa without lesions. Neck supple and nontender. No palpable supraclavicular or cervical adenopathy. Normal sized without goiter. Respiratory WNL. No retractions.. Cardiovascular Pedal Pulses WNL. No clubbing, cyanosis or edema. Lymphatic No adneopathy. No adenopathy. No adenopathy. Musculoskeletal Adexa without tenderness or enlargement.. Digits and nails w/o clubbing, cyanosis, infection, petechiae, ischemia, or inflammatory conditions.Marland Kitchen Psychiatric Judgement and insight Intact.. No evidence of depression, anxiety, or agitation.. General Notes: continues to have an open wound with minimal undermining and sharp debridement was not required today. I was able to remove some debris with moist saline gauze. Integumentary (Hair, Skin) No suspicious lesions. No crepitus or fluctuance. No  peri-wound warmth or erythema. No masses.. Wound #1 status is Healed - Epithelialized. Original cause of wound was Gradually Appeared. The wound is located on the Left,Proximal,Anterior Lower Leg. The wound measures 0cm length x 0cm width x 0cm depth; 0cm^2 area and 0cm^3 volume. There is no tunneling or undermining noted. There is a none present amount of drainage noted. The wound margin is distinct with the outline attached to the wound base. There is no granulation within the wound bed. There is no necrotic tissue within the wound bed. The periwound skin appearance exhibited: Dry/Scaly. The periwound skin appearance did not exhibit: Callus, Crepitus, Excoriation, Fluctuance, Friable, Induration, Localized Edema, Rash, Scarring, Maceration, Moist, Atrophie Blanche, Cyanosis, Ecchymosis, Hemosiderin Staining, Mottled, Pallor, Rubor, Erythema. Periwound Escher, Lesly ([id number]) temperature was noted as Cool/Cold. The periwound has tenderness on palpation. Wound #2 status is Open. Original cause of wound was Gradually Appeared. The wound is located on the [facility] Lower Leg. The wound measures 1.2cm length x 0.5cm width x 0.2cm depth; 0.471cm^2 area and 0.094cm^3 volume. There is no tunneling or undermining noted. There is a large amount of serosanguineous drainage noted. The wound margin is distinct with the outline attached to the wound base. There is large (67-100%) red granulation within the wound bed. There is a small (1-33%) amount of necrotic tissue within the wound bed including Adherent Slough. The periwound skin appearance exhibited: Moist. The periwound skin appearance did not exhibit: Callus, Crepitus, Excoriation, Fluctuance, Friable, Induration, Localized Edema, Rash, Scarring, Dry/Scaly, Maceration, Atrophie Blanche, Cyanosis, Ecchymosis, Hemosiderin Staining, Mottled, Pallor, Rubor, Erythema. Periwound temperature was noted as Cool/Cold. The periwound has tenderness on  palpation. Wound #3 status is Healed - Epithelialized. Original cause of wound was Trauma. The wound is located on the Left,Lateral Lower Leg. The wound measures 0cm length x 0cm width x 0cm depth; 0cm^2 area and 0cm^3 volume. Assessment Active Problems ICD-10 L97.222 - Non-pressure chronic ulcer of left calf with fat layer exposed M61.462 - Other calcification of muscle, left lower leg I73.9 - Peripheral vascular disease, unspecified Plan Wound Cleansing: Wound #2 Left,Distal,Anterior Lower Leg: Clean wound with Normal Saline. Cleanse wound with mild soap and water May Shower, gently pat wound dry prior to applying new dressing. Anesthetic: Wound #2 Left,Distal,Anterior Lower Leg: Topical Lidocaine 4% cream applied to wound bed prior to debridement - for clinic use Skin Barriers/Peri-Wound Care: Wound #2 Left,Distal,Anterior Lower Leg: Barrier cream Primary Wound Dressing: Wound #2 Left,Distal,Anterior Lower Leg: Prisma Ag - please moisten with saline Jaime Zuniga, Jaime Zuniga ([id number]) Secondary Dressing: Wound #2 Left,Distal,Anterior Lower Leg: ABD pad Dry Gauze - netting Dressing Change Frequency: Wound #2 Left,Distal,Anterior Lower Leg: Change dressing every other day. Follow-up Appointments: Wound #2 Left,Distal,Anterior Lower Leg: Return Appointment in 1 week. Edema Control: Wound #2 Left,Distal,Anterior Lower Leg: Elevate legs to the level of the heart and pump ankles  as often as possible Additional Orders / Instructions: Wound #2 Left,Distal,Anterior Lower Leg: Stop Smoking Increase protein intake. Home Health: Wound #2 Left,Distal,Anterior Lower Leg: [facility] may visit PRN to address patient s wound care needs. FACE TO FACE ENCOUNTER: MEDICARE and MEDICAID PATIENTS: I certify that this patient is under my care and that I had a face-to-face encounter that meets the physician face-to-face encounter requirements with this  patient on this date. The encounter with the patient was in whole or in part for the following MEDICAL CONDITION: (primary reason for Home Healthcare) MEDICAL NECESSITY: I certify, that based on my findings, NURSING services are a medically necessary home health service. HOME BOUND STATUS: I certify that my clinical findings support that this patient is homebound (i.e., Due to illness or injury, pt requires aid of supportive devices such as crutches, cane, wheelchairs, walkers, the use of special transportation or the assistance of another person to leave their place of residence. There is a normal inability to leave the home and doing so requires considerable and taxing effort. Other absences are for medical reasons / religious services and are infrequent or of short duration when for other reasons). If current dressing causes regression in wound condition, may D/C ordered dressing product/s and apply Normal Saline Moist Dressing daily until next Wound Healing Center / Other MD appointment. Notify Wound Healing Center of regression in wound condition at [phone]. Please direct any NON-WOUND related issues/requests for orders to patient's Primary Care Physician Medications-please add to medication list.: Wound #2 Left,Distal,Anterior Lower Leg: Other: - vitamin C, zinc, multivitamin I have recommended: 1. Prisma Ag to the wounds and lightly covered with a gauze and Kerlix dressing to be changed daily 2. He says has stopped smoking since we spoke to him last 3. [place] ([id number]) Electronic Signature(s) Signed: 03/30/2016 11:08:06 AM By: Evlyn KannerBritto, Kaycen Whitworth MD, FACS Entered By: Evlyn KannerBritto, Paidyn Mcferran on 03/30/2016 11:08:05 Jaime MaclachlanBURTON, Andree ([id number]) -------------------------------------------------------------------------------- SuperBill Details Patient Name: Jaime MaclachlanBURTON, Notnamed Date of Service: 03/30/2016 Medical Record Number: [id number] Patient Account Number:  [id number] Date of Birth/Sex: 03-27-1951 (65 y.o. Male) Treating RN: Huel CoventryWoody, Kim Primary Care Physician: Evelene CroonNiemeyer, Meindert Other Clinician: Referring Physician: Evelene CroonNiemeyer, Meindert Treating Physician/Extender: Rudene ReBritto, Henryk Ursin Weeks in Treatment: 6 Diagnosis Coding ICD-10 Codes Code Description [phone] [id number] Non-pressure chronic ulcer of left calf with fat layer exposed M61.462 Other calcification of muscle, left lower leg I73.9 Peripheral vascular disease, unspecified Facility Procedures CPT4 Code: [id number] Description: [id number] - WOUND CARE VISIT-LEV 2 EST PT Modifier: Quantity: 1 Physician Procedures CPT4 Code: [id number] Description: 99213 - WC PHYS LEVEL 3 - EST PT ICD-10 Description Diagnosis L97.222 Non-pressure chronic ulcer of left calf with fat M61.462 Other calcification of muscle, left lower leg I73.9 Peripheral vascular disease, unspecified Modifier: layer exposed Quantity: 1 Electronic Signature(s) Signed: 03/30/2016 11:08:22 AM By: Evlyn KannerBritto, Nou Chard MD, FACS Entered By: Evlyn KannerBritto, Zaiah Credeur on 03/30/2016 11:08:21

## 2016-03-31 NOTE — Progress Notes (Signed)
Jaime Zuniga, Jaime Zuniga ([id number]) Visit Report for 03/30/2016 Arrival Information Details Patient Name: Jaime Zuniga Date of Service: 03/30/2016 10:00 AM Medical Record Number: [id number] Patient Account Number: [id number] Date of Birth/Sex: 1951-04-17 (65 y.o. Male) Treating RN: Huel Coventry Primary Care Physician: Evelene Croon Other Clinician: Referring Physician: Evelene Croon Treating Physician/Extender: Rudene Re in Treatment: 6 Visit Information History Since Last Visit Added or deleted any medications: No Patient Arrived: Jaime Zuniga Any new allergies or adverse reactions: No Arrival Time: 10:09 Had a fall or experienced change in No Accompanied By: caregiver activities of daily living that may affect Transfer Assistance: None risk of falls: Patient Identification Verified: Yes Signs or symptoms of abuse/neglect since last No Secondary Verification Process Yes visito Completed: Hospitalized since last visit: No Patient Requires Transmission-Based No Has Dressing in Place as Prescribed: Yes Precautions: Pain Present Now: No Patient Has Alerts: No Electronic Signature(s) Signed: 03/30/2016 4:59:47 PM By: Elliot Gurney, RN, BSN, Kim RN, BSN Entered By: Elliot Gurney, RN, BSN, Kim on 03/30/2016 10:10:28 Jaime Zuniga ([id number]) -------------------------------------------------------------------------------- Clinic Level of Care Assessment Details Patient Name: Jaime Zuniga Date of Service: 03/30/2016 10:00 AM Medical Record Number: [id number] Patient Account Number: [id number] Date of Birth/Sex: 06-06-1951 (65 y.o. Male) Treating RN: Clover Mealy, RN, BSN, Rita Primary Care Physician: Evelene Croon Other Clinician: Referring Physician: Evelene Croon Treating Physician/Extender: Rudene Re in Treatment: 6 Clinic Level of Care Assessment Items TOOL 4 Quantity Score []  - Use when only an EandM is performed on FOLLOW-UP visit 0 ASSESSMENTS - Nursing Assessment /  Reassessment X - Reassessment of Co-morbidities (includes updates in patient status) 1 10 X - Reassessment of Adherence to Treatment Plan 1 5 ASSESSMENTS - Wound and Skin Assessment / Reassessment X - Simple Wound Assessment / Reassessment - one wound 1 5 []  - Complex Wound Assessment / Reassessment - multiple wounds 0 []  - Dermatologic / Skin Assessment (not related to wound area) 0 ASSESSMENTS - Focused Assessment []  - Circumferential Edema Measurements - multi extremities 0 []  - Nutritional Assessment / Counseling / Intervention 0 []  - Lower Extremity Assessment (monofilament, tuning fork, pulses) 0 []  - Peripheral Arterial Disease Assessment (using hand held doppler) 0 ASSESSMENTS - Ostomy and/or Continence Assessment and Care []  - Incontinence Assessment and Management 0 []  - Ostomy Care Assessment and Management (repouching, etc.) 0 PROCESS - Coordination of Care X - Simple Patient / Family Education for ongoing care 1 15 []  - Complex (extensive) Patient / Family Education for ongoing care 0 []  - Staff obtains Chiropractor, Records, Test Results / Process Orders 0 []  - Staff telephones HHA, Nursing Homes / Clarify orders / etc 0 []  - Routine Transfer to another Facility (non-emergent condition) 0 Soliday, Leslee ([id number]) []  - Routine Hospital Admission (non-emergent condition) 0 []  - New Admissions / Manufacturing engineer / Ordering NPWT, Apligraf, etc. 0 []  - Emergency Hospital Admission (emergent condition) 0 []  - Simple Discharge Coordination 0 []  - Complex (extensive) Discharge Coordination 0 PROCESS - Special Needs []  - Pediatric / Minor Patient Management 0 []  - Isolation Patient Management 0 []  - Hearing / Language / Visual special needs 0 []  - Assessment of Community assistance (transportation, D/C planning, etc.) 0 []  - Additional assistance / Altered mentation 0 []  - Support Surface(s) Assessment (bed, cushion, seat, etc.) 0 INTERVENTIONS - Wound Cleansing /  Measurement X - Simple Wound Cleansing - one wound 1 5 []  - Complex Wound Cleansing - multiple wounds 0 X - Wound Imaging (photographs - any number of wounds) 1 5 []  -  Wound Tracing (instead of photographs) 0 X - Simple Wound Measurement - one wound 1 5 []  - Complex Wound Measurement - multiple wounds 0 INTERVENTIONS - Wound Dressings []  - Small Wound Dressing one or multiple wounds 0 []  - Medium Wound Dressing one or multiple wounds 0 []  - Large Wound Dressing one or multiple wounds 0 []  - Application of Medications - topical 0 []  - Application of Medications - injection 0 INTERVENTIONS - Miscellaneous []  - External ear exam 0 Gaudin, Leverett ([id number]) []  - Specimen Collection (cultures, biopsies, blood, body fluids, etc.) 0 []  - Specimen(s) / Culture(s) sent or taken to Lab for analysis 0 []  - Patient Transfer (multiple staff / Michiel SitesHoyer Lift / Similar devices) 0 []  - Simple Staple / Suture removal (25 or less) 0 []  - Complex Staple / Suture removal (26 or more) 0 []  - Hypo / Hyperglycemic Management (close monitor of Blood Glucose) 0 []  - Ankle / Brachial Index (ABI) - do not check if billed separately 0 X - Vital Signs 1 5 Has the patient been seen at the hospital within the last three years: Yes Total Score: 55 Level Of Care: New/Established - Level 2 Electronic Signature(s) Signed: 03/30/2016 5:25:53 PM By: Elpidio EricAfful, Rita BSN, RN Entered By: Elpidio EricAfful, Rita on 03/30/2016 10:42:47 Jaime Zuniga ([id number]) -------------------------------------------------------------------------------- Encounter Discharge Information Details Patient Name: Jaime Zuniga Date of Service: 03/30/2016 10:00 AM Medical Record Number: [id number] Patient Account Number: [id number] Date of Birth/Sex: 1950/08/24 10(65 y.o. Male) Treating RN: Clover MealyAfful, RN, BSN, [place] Primary Care Physician: Evelene CroonNiemeyer, Meindert Other Clinician: Referring Physician: Evelene CroonNiemeyer, Meindert Treating Physician/Extender: Rudene ReBritto, Errol Weeks in  Treatment: 6 Encounter Discharge Information Items Discharge Pain Level: 0 Discharge Condition: Stable Ambulatory Status: Walker Discharge Destination: Home Transportation: Private Auto Accompanied By: cartegiver Schedule Follow-up Appointment: Yes Medication Reconciliation completed Yes and provided to Patient/Care Guida Asman: Provided on Clinical Summary of Care: 03/30/2016 Form Type Recipient Paper Patient SB Electronic Signature(s) Signed: 03/30/2016 10:48:02 AM By: Gwenlyn PerkingMoore, Shelia Entered By: Gwenlyn PerkingMoore, Shelia on 03/30/2016 10:48:01 Jaime Zuniga ([id number]) -------------------------------------------------------------------------------- Lower Extremity Assessment Details Patient Name: Jaime Zuniga Date of Service: 03/30/2016 10:00 AM Medical Record Number: [id number] Patient Account Number: [id number] Date of Birth/Sex: 1950/08/24 (65 y.o. Male) Treating RN: Huel CoventryWoody, Kim Primary Care Physician: Evelene CroonNiemeyer, Meindert Other Clinician: Referring Physician: Evelene CroonNiemeyer, Meindert Treating Physician/Extender: Rudene ReBritto, Errol Weeks in Treatment: 6 Vascular Assessment Pulses: Posterior Tibial Dorsalis Pedis Palpable: [Left:Yes] Extremity colors, hair growth, and conditions: Extremity Color: [Left:Hyperpigmented] Hair Growth on Extremity: [Left:Yes] Temperature of Extremity: [Left:Cool] Capillary Refill: [Left:< 3 seconds] Lipodermatosclerosis: [Left:Yes] Toe Nail Assessment Left: Right: Thick: Yes Discolored: Yes Deformed: Yes Improper Length and Hygiene: No Electronic Signature(s) Signed: 03/30/2016 4:59:47 PM By: Elliot GurneyWoody, RN, BSN, Kim RN, BSN Entered By: Elliot GurneyWoody, RN, BSN, Kim on 03/30/2016 10:16:57 Jaime Zuniga ([id number]) -------------------------------------------------------------------------------- Multi Wound Chart Details Patient Name: Jaime Zuniga Date of Service: 03/30/2016 10:00 AM Medical Record Number: [id number] Patient Account Number: [id number] Date of Birth/Sex:  1950/08/24 (65 y.o. Male) Treating RN: Clover MealyAfful, RN, BSN, Rita Primary Care Physician: Evelene CroonNiemeyer, Meindert Other Clinician: Referring Physician: Evelene CroonNiemeyer, Meindert Treating Physician/Extender: Rudene ReBritto, Errol Weeks in Treatment: 6 Vital Signs Height(in): 72 Pulse(bpm): 61 Weight(lbs): 210 Blood Pressure 89/58 (mmHg): Body Mass Index(BMI): 28 Temperature(F): 97.8 Respiratory Rate 18 (breaths/min): Photos: [3:No Photos] Wound Location: Left Lower Leg - Anterior, Left Lower Leg - Anterior, Left, Lateral Lower Leg Proximal Distal Wounding Event: Gradually Appeared Gradually Appeared Trauma Primary Etiology: Venous Leg Ulcer Venous Leg Ulcer Trauma, Other Comorbid History: Chronic Obstructive Chronic Obstructive N/A Pulmonary  Disease Pulmonary Disease (COPD), Hypertension, (COPD), Hypertension, Myocardial Infarction, Myocardial Infarction, Osteoarthritis, Dementia Osteoarthritis, Dementia Date Acquired: 01/26/2016 01/26/2016 03/23/2016 Weeks of Treatment: 6 6 1  Wound Status: Healed - Epithelialized Open Healed - Epithelialized Measurements L x W x D 0x0x0 1.2x0.5x0.2 0x0x0 (cm) Area (cm) : 0 0.471 0 Volume (cm) : 0 0.094 0 % Reduction in Area: 100.00% 60.00% 100.00% % Reduction in Volume: 100.00% 60.20% 100.00% Classification: Full Thickness Without Full Thickness Without Partial Thickness Exposed Support Exposed Support Structures Structures Exudate Amount: None Present Large N/A Exudate Type: N/A Serosanguineous N/A Portnoy, Jaime Zuniga ([id number]) Exudate Color: N/A red, brown N/A Wound Margin: Distinct, outline attached Distinct, outline attached N/A Granulation Amount: None Present (0%) Large (67-100%) N/A Granulation Quality: N/A Red N/A Necrotic Amount: None Present (0%) Small (1-33%) N/A Exposed Structures: Fascia: No Fascia: No N/A Fat: No Fat: No Tendon: No Tendon: No Muscle: No Muscle: No Joint: No Joint: No Bone: No Bone: No Epithelialization: Large (67-100%)  None N/A Periwound Skin Texture: Edema: No Edema: No No Abnormalities Noted Excoriation: No Excoriation: No Induration: No Induration: No Callus: No Callus: No Crepitus: No Crepitus: No Fluctuance: No Fluctuance: No Friable: No Friable: No Rash: No Rash: No Scarring: No Scarring: No Periwound Skin Dry/Scaly: Yes Moist: Yes No Abnormalities Noted Moisture: Maceration: No Maceration: No Moist: No Dry/Scaly: No Periwound Skin Color: Atrophie Blanche: No Atrophie Blanche: No No Abnormalities Noted Cyanosis: No Cyanosis: No Ecchymosis: No Ecchymosis: No Erythema: No Erythema: No Hemosiderin Staining: No Hemosiderin Staining: No Mottled: No Mottled: No Pallor: No Pallor: No Rubor: No Rubor: No Temperature: Cool/Cold Cool/Cold N/A Tenderness on Yes Yes No Palpation: Wound Preparation: Ulcer Cleansing: Ulcer Cleansing: N/A Rinsed/Irrigated with Rinsed/Irrigated with Saline Saline Topical Anesthetic Topical Anesthetic Applied: None Applied: Other: Lidocaine 4% Cream Treatment Notes Electronic Signature(s) Signed: 03/30/2016 5:25:53 PM By: Elpidio Eric BSN, RN Entered By: Elpidio Eric on 03/30/2016 10:41:30 Jaime Zuniga, Jaime Zuniga ([id number]) Jaime Zuniga, Jaime Zuniga ([id number]) -------------------------------------------------------------------------------- Multi-Disciplinary Care Plan Details Patient Name: Jaime Zuniga Date of Service: 03/30/2016 10:00 AM Medical Record Number: [id number] Patient Account Number: [id number] Date of Birth/Sex: 1950-08-14 (65 y.o. Male) Treating RN: Clover Mealy, RN, BSN, [place] Primary Care Physician: Evelene Croon Other Clinician: Referring Physician: Evelene Croon Treating Physician/Extender: Rudene Re in Treatment: 6 Active Inactive Abuse / Safety / Falls / Self Care Management Nursing Diagnoses: Potential for falls Goals: Patient will remain injury free Date Initiated: 02/11/2016 Goal Status: Active Interventions: Assess fall  risk on admission and as needed Assess self care needs on admission and as needed Notes: Nutrition Nursing Diagnoses: Imbalanced nutrition Goals: Patient/caregiver agrees to and verbalizes understanding of need to use nutritional supplements and/or vitamins as prescribed Date Initiated: 02/11/2016 Goal Status: Active Interventions: Assess patient nutrition upon admission and as needed per policy Notes: Orientation to the Wound Care Program Nursing Diagnoses: Knowledge deficit related to the wound healing center program GoalsKMARION, Jaime Zuniga ([id number]) Patient/caregiver will verbalize understanding of the Wound Healing Center Program Date Initiated: 02/11/2016 Goal Status: Active Interventions: Provide education on orientation to the wound center Notes: Pain, Acute or Chronic Nursing Diagnoses: Pain, acute or chronic: actual or potential Potential alteration in comfort, pain Goals: Patient will verbalize adequate pain control and receive pain control interventions during procedures as needed Date Initiated: 02/11/2016 Goal Status: Active Patient/caregiver will verbalize adequate pain control between visits Date Initiated: 02/11/2016 Goal Status: Active Interventions: Assess comfort goal upon admission Complete pain assessment as per visit requirements Notes: Soft Tissue Infection Nursing Diagnoses: Impaired tissue integrity Goals: Patient/caregiver will  verbalize understanding of or measures to prevent infection and contamination in the home setting Date Initiated: 02/11/2016 Goal Status: Active Patient's soft tissue infection will resolve Date Initiated: 02/11/2016 Goal Status: Active Interventions: Assess signs and symptoms of infection every visit Jaime Zuniga, Jaime Zuniga ([id number]) Notes: Wound/Skin Impairment Nursing Diagnoses: Impaired tissue integrity Knowledge deficit related to smoking impact on wound healing Goals: Patient will demonstrate a reduced rate of smoking or  cessation of smoking Date Initiated: 02/11/2016 Goal Status: Active Ulcer/skin breakdown will have a volume reduction of 30% by week 4 Date Initiated: 02/11/2016 Goal Status: Active Ulcer/skin breakdown will have a volume reduction of 50% by week 8 Date Initiated: 02/11/2016 Goal Status: Active Ulcer/skin breakdown will have a volume reduction of 80% by week 12 Date Initiated: 02/11/2016 Goal Status: Active Interventions: Assess ulceration(s) every visit Notes: Electronic Signature(s) Signed: 03/30/2016 5:25:53 PM By: Elpidio Eric BSN, RN Entered By: Elpidio Eric on 03/30/2016 10:41:22 Jaime Zuniga ([id number]) -------------------------------------------------------------------------------- Pain Assessment Details Patient Name: Jaime Zuniga Date of Service: 03/30/2016 10:00 AM Medical Record Number: [id number] Patient Account Number: [id number] Date of Birth/Sex: 10/13/1950 (65 y.o. Male) Treating RN: Huel Coventry Primary Care Physician: Evelene Croon Other Clinician: Referring Physician: Evelene Croon Treating Physician/Extender: Rudene Re in Treatment: 6 Active Problems Location of Pain Severity and Description of Pain Patient Has Paino No Site Locations With Dressing Change: No Pain Management and Medication Current Pain Management: Notes Topical or injectable lidocaine is offered to patient for acute pain when surgical debridement is performed. If needed, Patient is instructed to use over the counter pain medication for the following 24-48 hours after debridement. Wound care MDs do not prescribed pain medications. Patient has chronic pain or uncontrolled pain. Patient has been instructed to make an appointment with their Primary Care Physician for pain management. Electronic Signature(s) Signed: 03/30/2016 4:59:47 PM By: Elliot Gurney, RN, BSN, Kim RN, BSN Entered By: Elliot Gurney, RN, BSN, Kim on 03/30/2016 10:10:48 Jaime Zuniga  ([id number]) -------------------------------------------------------------------------------- Patient/Caregiver Education Details Patient Name: Jaime Zuniga Date of Service: 03/30/2016 10:00 AM Medical Record Number: [id number] Patient Account Number: [id number] Date of Birth/Gender: 07/02/1951 (65 y.o. Male) Treating RN: Clover Mealy, RN, BSN, [place] Primary Care Physician: Evelene Croon Other Clinician: Referring Physician: Evelene Croon Treating Physician/Extender: Rudene Re in Treatment: 6 Education Assessment Education Provided To: Patient Education Topics Provided Wound/Skin Impairment: Handouts: Caring for Your Ulcer Methods: Demonstration, Explain/Verbal Responses: State content correctly Electronic Signature(s) Signed: 03/30/2016 5:25:53 PM By: Elpidio Eric BSN, RN Entered By: Elpidio Eric on 03/30/2016 10:47:50 Jaime Zuniga ([id number]) -------------------------------------------------------------------------------- Wound Assessment Details Patient Name: Jaime Zuniga Date of Service: 03/30/2016 10:00 AM Medical Record Number: [id number] Patient Account Number: [id number] Date of Birth/Sex: 03/06/1951 (65 y.o. Male) Treating RN: Clover Mealy, RN, BSN, Rita Primary Care Physician: Evelene Croon Other Clinician: Referring Physician: Evelene Croon Treating Physician/Extender: Rudene Re in Treatment: 6 Wound Status Wound Number: 1 Primary Venous Leg Ulcer Etiology: Wound Location: Left Lower Leg - Anterior, Proximal Wound Healed - Epithelialized Status: Wounding Event: Gradually Appeared Comorbid Chronic Obstructive Pulmonary Disease Date Acquired: 01/26/2016 History: (COPD), Hypertension, Myocardial Weeks Of Treatment: 6 Infarction, Osteoarthritis, Dementia Clustered Wound: No Photos Wound Measurements Length: (cm) 0 % Reduction Width: (cm) 0 % Reduction Depth: (cm) 0 Epithelializ Area: (cm) 0 Tunneling: Volume: (cm) 0 Undermining in  Area: 100% in Volume: 100% ation: Large (67-100%) No : No Wound Description Full Thickness Without Exposed Classification: Support Structures Wound Margin: Distinct, outline attached Exudate None Present Amount: Foul Odor After Cleansing: No Wound Bed Granulation Amount:  None Present (0%) Exposed Structure Necrotic Amount: None Present (0%) Fascia Exposed: No Fat Layer Exposed: No Tendon Exposed: No Muscle Exposed: No Veselka, Jaime Zuniga ([id number]) Joint Exposed: No Bone Exposed: No Periwound Skin Texture Texture Color No Abnormalities Noted: No No Abnormalities Noted: No Callus: No Atrophie Blanche: No Crepitus: No Cyanosis: No Excoriation: No Ecchymosis: No Fluctuance: No Erythema: No Friable: No Hemosiderin Staining: No Induration: No Mottled: No Localized Edema: No Pallor: No Rash: No Rubor: No Scarring: No Temperature / Pain Moisture Temperature: Cool/Cold No Abnormalities Noted: No Tenderness on Palpation: Yes Dry / Scaly: Yes Maceration: No Moist: No Wound Preparation Ulcer Cleansing: Rinsed/Irrigated with Saline Topical Anesthetic Applied: None Electronic Signature(s) Signed: 03/30/2016 5:25:53 PM By: Elpidio Eric BSN, RN Entered By: Elpidio Eric on 03/30/2016 10:41:12 Jaime Zuniga ([id number]) -------------------------------------------------------------------------------- Wound Assessment Details Patient Name: Jaime Zuniga Date of Service: 03/30/2016 10:00 AM Medical Record Number: [id number] Patient Account Number: [id number] Date of Birth/Sex: 1950-10-21 (65 y.o. Male) Treating RN: Huel Coventry Primary Care Physician: Evelene Croon Other Clinician: Referring Physician: Evelene Croon Treating Physician/Extender: Rudene Re in Treatment: 6 Wound Status Wound Number: 2 Primary Venous Leg Ulcer Etiology: Wound Location: Left Lower Leg - Anterior, Distal Wound Open Status: Wounding Event: Gradually Appeared Comorbid Chronic  Obstructive Pulmonary Disease Date Acquired: 01/26/2016 History: (COPD), Hypertension, Myocardial Weeks Of Treatment: 6 Infarction, Osteoarthritis, Dementia Clustered Wound: No Photos Wound Measurements Length: (cm) 1.2 Width: (cm) 0.5 Depth: (cm) 0.2 Area: (cm) 0.471 Volume: (cm) 0.094 % Reduction in Area: 60% % Reduction in Volume: 60.2% Epithelialization: None Tunneling: No Undermining: No Wound Description Full Thickness Without Exposed Classification: Support Structures Wound Margin: Distinct, outline attached Exudate Large Amount: Exudate Type: Serosanguineous Exudate Color: red, brown Foul Odor After Cleansing: No Wound Bed Granulation Amount: Large (67-100%) Exposed Structure Granulation Quality: Red Fascia Exposed: No Necrotic Amount: Small (1-33%) Fat Layer Exposed: No Goodell, Natale ([id number]) Necrotic Quality: Adherent Slough Tendon Exposed: No Muscle Exposed: No Joint Exposed: No Bone Exposed: No Periwound Skin Texture Texture Color No Abnormalities Noted: No No Abnormalities Noted: No Callus: No Atrophie Blanche: No Crepitus: No Cyanosis: No Excoriation: No Ecchymosis: No Fluctuance: No Erythema: No Friable: No Hemosiderin Staining: No Induration: No Mottled: No Localized Edema: No Pallor: No Rash: No Rubor: No Scarring: No Temperature / Pain Moisture Temperature: Cool/Cold No Abnormalities Noted: No Tenderness on Palpation: Yes Dry / Scaly: No Maceration: No Moist: Yes Wound Preparation Ulcer Cleansing: Rinsed/Irrigated with Saline Topical Anesthetic Applied: Other: Lidocaine 4% Cream, Treatment Notes Wound #2 (Left, Distal, Anterior Lower Leg) 1. Cleansed with: Clean wound with Normal Saline 2. Anesthetic Topical Lidocaine 4% cream to wound bed prior to debridement 4. Dressing Applied: Prisma Ag 5. Secondary Dressing Applied Bordered Foam Dressing Electronic Signature(s) Signed: 03/30/2016 4:59:47 PM By: Elliot Gurney, RN,  BSN, Kim RN, BSN Entered By: Elliot Gurney, RN, BSN, Kim on 03/30/2016 10:25:24 Jaime Zuniga ([id number]) -------------------------------------------------------------------------------- Wound Assessment Details Patient Name: Jaime Zuniga Date of Service: 03/30/2016 10:00 AM Medical Record Number: [id number] Patient Account Number: [id number] Date of Birth/Sex: [birth year] (65 y.o. Male) Treating RN: Huel Coventry Primary Care Physician: Evelene Croon Other Clinician: Referring Physician: Evelene Croon Treating Physician/Extender: Rudene Re in Treatment: 6 Wound Status Wound Number: 3 Primary Etiology: Trauma, Other Wound Location: Left, Lateral Lower Leg Wound Status: Healed - Epithelialized Wounding Event: Trauma Date Acquired: 03/23/2016 Weeks Of Treatment: 1 Clustered Wound: No Photos Photo Uploaded By: Elliot Gurney, RN, BSN, Kim on 03/30/2016 11:47:28 Wound Measurements Length: (cm) 0 % Reduction in Width: (cm) 0 %  Reduction in Depth: (cm) 0 Area: (cm) 0 Volume: (cm) 0 Area: 100% Volume: 100% Wound Description Classification: Partial Thickness Periwound Skin Texture Texture Color No Abnormalities Noted: No No Abnormalities Noted: No Moisture No Abnormalities Noted: No Electronic Signature(s) Signed: 03/30/2016 4:59:47 PM By: Elliot Gurney, RN, BSN, Kim RN, BSN Jaime Zuniga, [place] ([id number]) Entered By: Elliot Gurney, RN, BSN, Kim on 03/30/2016 10:22:59 Jaime Zuniga ([id number]) -------------------------------------------------------------------------------- Vitals Details Patient Name: Jaime Zuniga Date of Service: 03/30/2016 10:00 AM Medical Record Number: [id number] Patient Account Number: [id number] Date of Birth/Sex: [birth year] (65 y.o. Male) Treating RN: Huel Coventry Primary Care Physician: Evelene Croon Other Clinician: Referring Physician: Evelene Croon Treating Physician/Extender: Rudene Re in Treatment: 6 Vital Signs Time Taken: 10:10 Temperature  (F): 97.8 Height (in): 72 Pulse (bpm): 61 Weight (lbs): 210 Respiratory Rate (breaths/min): 18 Body Mass Index (BMI): 28.5 Blood Pressure (mmHg): 89/58 Reference Range: 80 - 120 mg / dl Electronic Signature(s) Signed: 03/30/2016 4:59:47 PM By: Elliot Gurney, RN, BSN, Kim RN, BSN Entered By: Elliot Gurney, RN, BSN, Kim on 03/30/2016 10:14:22

## 2016-04-06 ENCOUNTER — Encounter: Payer: Medicare Other | Admitting: Surgery

## 2016-04-06 DIAGNOSIS — L97222 Non-pressure chronic ulcer of left calf with fat layer exposed: Secondary | ICD-10-CM | POA: Diagnosis not present

## 2016-04-07 NOTE — Progress Notes (Signed)
Jaime, Zuniga ([id number]) Visit Report for 04/06/2016 Chief Complaint Document Details Patient Name: Jaime Zuniga, Jaime Zuniga Date of Service: 04/06/2016 10:45 AM Medical Record Number: [id number] Patient Account Number: [id number] Date of Birth/Sex: 1951/05/17 (65 y.o. 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 Male) Treating RN: Huel Coventry Primary Care Physician: Evelene Croon Other Clinician: Referring Physician: Evelene Croon Treating Physician/Extender: Rudene Re in Treatment: 7 History of Present Illness Location: left lower extremity anterior shin area Quality: Patient reports experiencing a dull pain to affected area(s). Severity: Patient states wound are getting worse. Duration: Patient has had the wound for > 3 months prior to seeking treatment at the wound center Timing: Pain in wound is Intermittent (comes and goes Context: The wound appeared gradually over time Modifying Factors: Other treatment(s) tried include:Augmentin and doxycycline Associated Signs and Symptoms: Patient reports having increase swelling. HPI Description: 65 year old gentleman was being seen in the ER recently for  wounds on his left lower extremity which have been draining and there is swelling and he was concerned about wound infection. in the ER x-ray of the left leg was done which showed postsurgical changes but no acute findings.he wrapped his leg in and Unna's boots and referred him to the wound center. earlier in June he had a DVT study done of the left lower extremity which showed no evidence of DVT but there was edema seen. no reflux was seen on that study. in June he was seen by a surgeon Dr. Lemar Livings who advised symptomatic treatment. the patient was also seen at the [facility] he has recently completed courses of Augmentin and doxycycline. 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 02/24/2016 -- seen by Dr. Kirke Corin on 02/17/2016. Lower extremity arterial studies were done which showed a normal ABI bilaterally and normal great toe pressures. Duplex showed diffuse nonobstructive disease and a three-vessel runoff bilaterally. No further vascular studies or intervention was recommended. He was seen by Dr. Romilda Joy on 02/17/2016 and recommended to continue with wound care and no vascular studies intervention was recommended. Electronic Signature(s) Signed: 04/06/2016 11:10:47 AM By: Evlyn Kanner MD, FACS Entered By: Evlyn Kanner on 04/06/2016 11:10:47 Jaime Zuniga ([id number]) -------------------------------------------------------------------------------- Physical Exam Details Patient Name: Jaime Zuniga Date of Service: 04/06/2016 10:45 AM Medical Record Number: [id number] Patient Account Number: [id number] Date of Birth/Sex: [birth year] (65 y.o. Male) Treating RN: Huel Coventry Primary Care Physician: Evelene Croon Other Clinician: Referring Physician: Evelene Croon Treating Physician/Extender: Rudene Re in Treatment: 7 Constitutional . Pulse regular.  Respirations normal and unlabored. Afebrile. . Eyes Nonicteric. Reactive to light. Ears, Nose, Mouth, and Throat Lips, teeth, and gums WNL.Marland Kitchen Moist mucosa without lesions. Neck supple and nontender. No palpable supraclavicular or cervical adenopathy. Normal sized without goiter. Respiratory WNL. No retractions.. Breath sounds WNL, No rubs, rales, rhonchi, or wheeze.. Cardiovascular  Heart rhythm and rate regular, no murmur or gallop.. Pedal Pulses WNL. No clubbing, cyanosis or edema. Lymphatic No adneopathy. No adenopathy. No adenopathy. Musculoskeletal Adexa without tenderness or enlargement.. Digits and nails w/o clubbing, cyanosis, infection, petechiae, ischemia, or inflammatory conditions.. Integumentary (Hair, Skin) No suspicious lesions. No crepitus or fluctuance. No peri-wound warmth or erythema. No masses.Marland Kitchen Psychiatric Judgement and insight Intact.. No evidence of depression, anxiety, or agitation.. Notes wound continues to have healthy granulation tissue and the surrounding area has minimal exudate which was washed out with moist saline. Overall there is improvement. Electronic Signature(s) Signed: 04/06/2016 11:11:27 AM By: Evlyn Kanner MD, FACS Entered By: Evlyn Kanner on 04/06/2016 11:11:26 Jaime Zuniga ([id number]) -------------------------------------------------------------------------------- Physician Orders Details Patient Name: Jaime Zuniga Date of Service: 04/06/2016 10:45 AM Medical Record Number: [id number] Patient Account Number: [id number] Date of Birth/Sex: [birth year] (65 y.o. Male) Treating RN: Clover Mealy, RN, BSN, [place] Primary Care Physician: Evelene Croon Other Clinician: Referring Physician: Evelene Croon Treating Physician/Extender: Rudene Re in Treatment: 7 Verbal / Phone Orders: Yes Clinician: Afful, RN, BSN, Rita Read Back and Verified: Yes Diagnosis Coding Wound Cleansing Wound #2 Left,Distal,Anterior Lower Leg o Clean wound  with Normal Saline. o Cleanse wound with mild soap and water o May Shower, gently pat wound dry prior to applying new dressing. Anesthetic Wound #2 Left,Distal,Anterior Lower Leg o Topical Lidocaine 4% cream applied to wound bed prior to debridement - for clinic use Skin Barriers/Peri-Wound Care Wound #2 Left,Distal,Anterior Lower Leg o Barrier cream Primary Wound Dressing Wound #2 Left,Distal,Anterior Lower Leg o Prisma Ag - please moisten with saline Secondary Dressing Wound #2 Left,Distal,Anterior Lower Leg o Non-adherent pad - [place] Dressing Change Frequency Wound #2 Left,Distal,Anterior Lower Leg o Change dressing every other day. Follow-up Appointments Wound #2 Left,Distal,Anterior Lower Leg o Return Appointment in 1 week. Edema Control Wound #2 Left,Distal,Anterior Lower Leg o Elevate legs to the level of the heart and pump ankles as often as possible Trudel, Lowen ([id number]) Additional Orders / Instructions Wound #2 Left,Distal,Anterior Lower Leg o Stop Smoking o Increase protein intake. Home Health Wound #2 Left,Distal,Anterior Lower Leg o Continue Home Health Visits - [facility] o Home Health Nurse may visit PRN to address patientos wound care needs. o FACE TO FACE ENCOUNTER: MEDICARE and MEDICAID PATIENTS: I certify that this patient is under my care and that I had a face-to-face encounter that meets the physician face-to-face encounter requirements with this patient on this date. The encounter with the patient was in whole or in part for the following MEDICAL CONDITION: (primary reason for Home Healthcare) MEDICAL NECESSITY: I certify, that based on my findings, NURSING services are a medically necessary home health service. HOME BOUND STATUS: I certify that my clinical findings support that this patient is homebound (i.e., Due to illness or injury, pt requires aid of supportive devices such as crutches, cane, wheelchairs, walkers,  the use of special transportation or the assistance of another person to leave their place of residence. There is a normal inability to leave the home and doing so requires considerable and taxing effort. Other absences are for medical reasons / religious services and are infrequent or of short duration when for other reasons). o If current dressing causes regression in wound condition, may D/C ordered dressing product/s and apply Normal Saline Moist Dressing daily until next Wound Healing Center / Other MD appointment. Notify Wound Healing Center of regression in wound condition at [phone]. o Please direct any NON-WOUND related issues/requests for orders to patient's Primary Care  Physician Electronic Signature(s) Signed: 04/06/2016 4:38:34 PM By: Evlyn KannerBritto, Amalia Edgecombe MD, FACS Signed: 04/06/2016 5:32:33 PM By: Elpidio EricAfful, Rita BSN, RN Entered By: Elpidio EricAfful, Rita on 04/06/2016 11:09:26 Jaime MaclachlanBURTON, Jalien ([id number]) -------------------------------------------------------------------------------- Problem List Details Patient Name: Jaime MaclachlanBURTON, Asencion Date of Service: 04/06/2016 10:45 AM Medical Record Number: [id number] Patient Account Number: [id number] Date of Birth/Sex: 1951-06-28 (65 y.o. 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 M61.462 Other calcification of muscle, left lower leg 02/11/2016 Yes I73.9 Peripheral vascular disease, unspecified 02/11/2016 Yes Inactive Problems Resolved Problems Electronic Signature(s) Signed: 04/06/2016 11:10:37 AM By: Evlyn KannerBritto, Farley Crooker MD, FACS Entered By: Evlyn KannerBritto, Maniyah Moller on 04/06/2016 11:10:37 Jaime MaclachlanBURTON, Digby  ([id number]) -------------------------------------------------------------------------------- Progress Note Details Patient Name: Jaime MaclachlanBURTON, Edouard Date of Service: 04/06/2016 10:45 AM Medical Record Number: [id number] Patient Account Number: [id number] Date of Birth/Sex: 1951-06-28 56(65 y.o. 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 Severity: Patient states wound are getting worse. Duration: Patient has had the wound for > 3 months prior to seeking treatment at the wound center Timing: Pain in wound is Intermittent (comes and goes Context: The wound appeared gradually over time Modifying Factors: Other treatment(s) tried include:Augmentin and doxycycline Associated Signs and Symptoms: Patient reports having increase swelling. 65 year old gentleman was being seen in the ER recently for wounds on his left lower extremity which have been draining and there is swelling and he was concerned about wound infection. in the ER x-ray of the left leg was done which showed postsurgical changes but no acute findings.he wrapped his leg in and Unna's boots and referred him to the wound center. earlier in June he had a DVT study done of the left lower extremity which showed no evidence of DVT but there was edema seen. no reflux was seen on that study. in June he was seen by a surgeon Dr. Lemar LivingsByrnett who advised symptomatic  treatment. the [facility] he has recently completed courses of Augmentin and doxycycline. 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 02/24/2016 -- seen by Dr. Kirke CorinArida on 02/17/2016. Lower extremity arterial studies were done which showed a normal ABI bilaterally and normal great toe pressures. Duplex showed diffuse nonobstructive disease and a three-vessel runoff bilaterally. No further vascular studies or intervention was recommended. He was seen by Dr. Romilda JoyMohamed Arida on 02/17/2016 and recommended to continue with wound care and no vascular studies intervention was recommended. HavilandBURTON, Irene LimboSAMMY ([id number]) Objective Constitutional Pulse regular. Respirations normal and unlabored. Afebrile. Vitals Time Taken: 10:56 AM, Height: 72 in, Weight: 210 lbs, BMI: 28.5, Temperature: 97.7 F, Pulse: 66 bpm, Respiratory Rate: 18 breaths/min, Blood Pressure: 97/56 mmHg. Eyes Nonicteric. Reactive to light. Ears, Nose, Mouth, and Throat Lips, teeth, and  gums WNL.Marland Kitchen Moist mucosa without lesions. Neck supple and nontender. No palpable supraclavicular or cervical adenopathy. Normal sized without goiter. Respiratory WNL. No retractions.. Breath sounds WNL, No rubs, rales, rhonchi, or wheeze.. Cardiovascular Heart rhythm and rate regular, no murmur or gallop.. Pedal Pulses WNL. No clubbing, cyanosis or edema. Lymphatic No adneopathy. No adenopathy. No adenopathy. Musculoskeletal Adexa without tenderness or enlargement.. Digits and nails w/o clubbing, cyanosis, infection, petechiae, ischemia, or inflammatory conditions.Marland Kitchen Psychiatric Judgement and insight Intact.. No evidence of depression, anxiety, or agitation.. General Notes: wound continues to have healthy granulation tissue and the surrounding area has minimal exudate which was washed out with moist saline.  Overall there is improvement. Integumentary (Hair, Skin) No suspicious lesions. No crepitus or fluctuance. No peri-wound warmth or erythema. No masses.. Wound #2 status is Open. Original cause of wound was Gradually Appeared. The wound is located on the [facility] Lower Leg. The wound measures 1.2cm length x 1cm width x 0.2cm depth; 0.942cm^2 area and 0.188cm^3 volume. There is no tunneling or undermining noted. There is a large amount of serosanguineous drainage noted. The wound margin is distinct with the outline attached to the wound base. There is large (67-100%) red granulation within the wound bed. There is a small (1-33%) amount of necrotic tissue within the wound bed including Adherent Slough. The periwound skin appearance exhibited: Scarring, Moist. The periwound skin appearance did not exhibit: Callus, Crepitus, Excoriation, Fluctuance, Friable, Induration, Localized Edema, Rash, Dry/Scaly, Maceration, Atrophie Blanche, Cyanosis, Klarich, Rafferty ([id number]) Ecchymosis, Hemosiderin Staining, Mottled, Pallor, Rubor, Erythema. Periwound temperature was noted as No Abnormality. The periwound has tenderness on palpation. 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 Prisma Ag to the wounds and lightly covered with a gauze and Kerlix dressing to be changed daily 2. He says has stopped smoking since we spoke to him last 3. regular visits to the wound center Plan Wound Cleansing: Wound #2 Left,Distal,Anterior Lower Leg: Clean wound with Normal Saline. Cleanse wound with mild soap and water May Shower, gently pat wound dry prior to applying new dressing. Anesthetic: Wound #2 Left,Distal,Anterior Lower Leg: Topical Lidocaine 4% cream applied to wound bed prior to debridement - for clinic  use Skin Barriers/Peri-Wound Care: Wound #2 Left,Distal,Anterior Lower Leg: Barrier cream Primary Wound Dressing: Wound #2 Left,Distal,Anterior Lower Leg: Prisma Ag - please moisten with saline Secondary Dressing: Wound #2 Left,Distal,Anterior Lower Leg: Non-adherent pad - [place] Dressing Change Frequency: Wound #2 Left,Distal,Anterior Lower Leg: Change dressing every other day. Follow-up Appointments: Wound #2 Left,Distal,Anterior Lower Leg: HASSEN, BRUUN ([id number]) Return Appointment in 1 week. Edema Control: Wound #2 Left,Distal,Anterior Lower Leg: Elevate legs to the level of the heart and pump ankles as often as possible Additional Orders / Instructions: Wound #2 Left,Distal,Anterior Lower Leg: Stop Smoking Increase protein intake. Home Health: Wound #2 Left,Distal,Anterior Lower Leg: Continue Home Health Visits - [facility] Nurse may visit PRN to address patient s wound care needs. FACE TO FACE ENCOUNTER: MEDICARE and MEDICAID PATIENTS: I certify that this patient is under my care and that I had a face-to-face encounter that meets the physician face-to-face encounter requirements with this patient on this date. The encounter with the patient was in whole or in part for the following MEDICAL CONDITION: (primary reason for Home Healthcare) MEDICAL NECESSITY: I certify, that based on my findings, NURSING services are a medically  necessary home health service. HOME BOUND STATUS: I certify that my clinical findings support that this patient is homebound (i.e., Due to illness or injury, pt requires aid of supportive devices such as crutches, cane, wheelchairs, walkers, the use of special transportation or the assistance of another person to leave their place of residence. There is a normal inability to leave the home and doing so requires considerable and taxing effort. Other absences are for medical reasons / religious services and are infrequent or of short  duration when for other reasons). If current dressing causes regression in wound condition, may D/C ordered dressing product/s and apply Normal Saline Moist Dressing daily until next Wound Healing Center / Other MD appointment. Notify Wound Healing Center of regression in wound condition at [phone]. Please direct any NON-WOUND related issues/requests for orders to patient's Primary Care Physician I have recommended: 1. Prisma Ag to the wounds and lightly covered with a gauze and Kerlix dressing to be changed daily 2. He says has stopped smoking since we spoke to him last 3. regular visits to the wound center Electronic Signature(s) Signed: 04/06/2016 11:12:01 AM By: Evlyn Kanner MD, FACS Entered By: Evlyn Kanner on 04/06/2016 11:12:00 Jaime Zuniga ([id number]) -------------------------------------------------------------------------------- SuperBill Details Patient Name: Jaime Zuniga Date of Service: 04/06/2016 Medical Record Number: [id number] Patient Account Number: [id number] Date of Birth/Sex: [birth year] (65 y.o. Male) Treating RN: Huel Coventry Primary Care Physician: Evelene Croon Other Clinician: Referring Physician: Evelene Croon Treating Physician/Extender: Rudene Re in Treatment: 7 Diagnosis Coding ICD-10 Codes Code Description [phone] Non-pressure chronic ulcer of left calf with fat layer exposed M61.462 Other calcification of muscle, left lower leg I73.9 Peripheral vascular disease, unspecified Facility Procedures CPT4 Code: [id number] Description: 41324 - WOUND CARE VISIT-LEV 2 EST PT Modifier: Quantity: 1 Physician Procedures CPT4 Code: [id number] Description: 99213 - WC PHYS LEVEL 3 - EST PT ICD-10 Description Diagnosis L97.222 Non-pressure chronic ulcer of left calf with fat M61.462 Other calcification of muscle, left lower leg I73.9 Peripheral vascular disease, unspecified Modifier: layer exposed Quantity: 1 Electronic Signature(s) Signed:  04/06/2016 11:12:13 AM By: Evlyn Kanner MD, FACS Entered By: Evlyn Kanner on 04/06/2016 11:12:13

## 2016-04-07 NOTE — Progress Notes (Signed)
ADITH, TEJADA ([id number]) Visit Report for 04/06/2016 Arrival Information Details Patient Name: Jaime Zuniga, Jaime Zuniga Date of Service: 04/06/2016 10:45 AM Medical Record Number: [id number] Patient Account Number: [id number] Date of Birth/Sex: Sep 25, 1950 (65 y.o. Male) Treating RN: Huel Coventry Primary Care Physician: Evelene Croon Other Clinician: Referring Physician: Evelene Croon Treating Physician/Extender: Rudene Re in Treatment: 7 Visit Information History Since Last Visit Added or deleted any medications: No Patient Arrived: Jaime Zuniga Any new allergies or adverse reactions: No Arrival Time: 10:54 Had a fall or experienced change in No Accompanied By: caregiver activities of daily living that may affect Transfer Assistance: None risk of falls: Secondary Verification Process Yes Signs or symptoms of abuse/neglect since last No Completed: visito Patient Requires Transmission-Based No Hospitalized since last visit: No Precautions: Has Dressing in Place as Prescribed: Yes Patient Has Alerts: No Pain Present Now: No Electronic Signature(s) Signed: 04/06/2016 4:46:05 PM By: Elliot Gurney, RN, BSN, Kim RN, BSN Entered By: Elliot Gurney, RN, BSN, Kim on 04/06/2016 10:56:16 Jaime Zuniga ([id number]) -------------------------------------------------------------------------------- Clinic Level of Care Assessment Details Patient Name: Jaime Zuniga Date of Service: 04/06/2016 10:45 AM Medical Record Number: [id number] Patient Account Number: [id number] Date of Birth/Sex: Jul 05, 1951 (65 y.o. Male) Treating RN: Clover Mealy, RN, BSN, Rita Primary Care Physician: Evelene Croon Other Clinician: Referring Physician: Evelene Croon Treating Physician/Extender: Rudene Re in Treatment: 7 Clinic Level of Care Assessment Items TOOL 4 Quantity Score []  - Use when only an EandM is performed on FOLLOW-UP visit 0 ASSESSMENTS - Nursing Assessment / Reassessment X - Reassessment of  Co-morbidities (includes updates in patient status) 1 10 X - Reassessment of Adherence to Treatment Plan 1 5 ASSESSMENTS - Wound and Skin Assessment / Reassessment X - Simple Wound Assessment / Reassessment - one wound 1 5 []  - Complex Wound Assessment / Reassessment - multiple wounds 0 []  - Dermatologic / Skin Assessment (not related to wound area) 0 ASSESSMENTS - Focused Assessment []  - Circumferential Edema Measurements - multi extremities 0 []  - Nutritional Assessment / Counseling / Intervention 0 X - Lower Extremity Assessment (monofilament, tuning fork, pulses) 1 5 []  - Peripheral Arterial Disease Assessment (using hand held doppler) 0 ASSESSMENTS - Ostomy and/or Continence Assessment and Care []  - Incontinence Assessment and Management 0 []  - Ostomy Care Assessment and Management (repouching, etc.) 0 PROCESS - Coordination of Care X - Simple Patient / Family Education for ongoing care 1 15 []  - Complex (extensive) Patient / Family Education for ongoing care 0 []  - Staff obtains Chiropractor, Records, Test Results / Process Orders 0 []  - Staff telephones HHA, Nursing Homes / Clarify orders / etc 0 []  - Routine Transfer to another Facility (non-emergent condition) 0 Jaime Zuniga, Jaime Zuniga ([id number]) []  - Routine Hospital Admission (non-emergent condition) 0 []  - New Admissions / Manufacturing engineer / Ordering NPWT, Apligraf, etc. 0 []  - Emergency Hospital Admission (emergent condition) 0 []  - Simple Discharge Coordination 0 []  - Complex (extensive) Discharge Coordination 0 PROCESS - Special Needs []  - Pediatric / Minor Patient Management 0 []  - Isolation Patient Management 0 []  - Hearing / Language / Visual special needs 0 []  - Assessment of Community assistance (transportation, D/C planning, etc.) 0 []  - Additional assistance / Altered mentation 0 []  - Support Surface(s) Assessment (bed, cushion, seat, etc.) 0 INTERVENTIONS - Wound Cleansing / Measurement X - Simple Wound Cleansing -  one wound 1 5 []  - Complex Wound Cleansing - multiple wounds 0 X - Wound Imaging (photographs - any number of wounds) 1 5 []  - Wound Tracing (  instead of photographs) 0 X - Simple Wound Measurement - one wound 1 5 []  - Complex Wound Measurement - multiple wounds 0 INTERVENTIONS - Wound Dressings X - Small Wound Dressing one or multiple wounds 1 10 []  - Medium Wound Dressing one or multiple wounds 0 []  - Large Wound Dressing one or multiple wounds 0 []  - Application of Medications - topical 0 []  - Application of Medications - injection 0 INTERVENTIONS - Miscellaneous []  - External ear exam 0 Jaime Zuniga, Jaime Zuniga ([id number]) []  - Specimen Collection (cultures, biopsies, blood, body fluids, etc.) 0 []  - Specimen(s) / Culture(s) sent or taken to Lab for analysis 0 []  - Patient Transfer (multiple staff / Michiel Sites Lift / Similar devices) 0 []  - Simple Staple / Suture removal (25 or less) 0 []  - Complex Staple / Suture removal (26 or more) 0 []  - Hypo / Hyperglycemic Management (close monitor of Blood Glucose) 0 []  - Ankle / Brachial Index (ABI) - do not check if billed separately 0 X - Vital Signs 1 5 Has the patient been seen at the hospital within the last three years: Yes Total Score: 70 Level Of Care: New/Established - Level 2 Electronic Signature(s) Signed: 04/06/2016 5:32:33 PM By: Elpidio Eric BSN, RN Entered By: Elpidio Eric on 04/06/2016 11:08:34 Jaime Zuniga ([id number]) -------------------------------------------------------------------------------- Encounter Discharge Information Details Patient Name: Jaime Zuniga Date of Service: 04/06/2016 10:45 AM Medical Record Number: [id number] Patient Account Number: [id number] Date of Birth/Sex: 10-07-1950 (65 y.o. Male) Treating RN: Huel Coventry Primary Care Physician: Evelene Croon Other Clinician: Referring Physician: Evelene Croon Treating Physician/Extender: Rudene Re in Treatment: 7 Encounter Discharge Information  Items Discharge Pain Level: 0 Discharge Condition: Stable Ambulatory Status: Walker Discharge Destination: Home Transportation: Private Auto Accompanied By: caregiver Schedule Follow-up Appointment: Yes Medication Reconciliation completed Yes and provided to Patient/Care Sonnet Rizor: Provided on Clinical Summary of Care: 04/06/2016 Form Type Recipient Paper Patient SB Electronic Signature(s) Signed: 04/06/2016 4:46:05 PM By: Elliot Gurney, RN, BSN, Kim RN, BSN Previous Signature: 04/06/2016 11:13:07 AM Version By: Gwenlyn Perking Entered By: Elliot Gurney RN, BSN, Kim on 04/06/2016 11:13:52 Jaime Zuniga ([id number]) -------------------------------------------------------------------------------- Lower Extremity Assessment Details Patient Name: Jaime Zuniga Date of Service: 04/06/2016 10:45 AM Medical Record Number: [id number] Patient Account Number: [id number] Date of Birth/Sex: [birth year] (65 y.o. Male) Treating RN: Huel Coventry Primary Care Physician: Evelene Croon Other Clinician: Referring Physician: Evelene Croon Treating Physician/Extender: Rudene Re in Treatment: 7 Edema Assessment Assessed: [Left: Yes] [Right: No] Edema: [Left: N] [Right: o] Vascular Assessment Claudication: Claudication Assessment [Left:None] Pulses: Posterior Tibial Palpable: [Left:Yes] Dorsalis Pedis Palpable: [Left:Yes] Extremity colors, hair growth, and conditions: Extremity Color: [Left:Hyperpigmented] Hair Growth on Extremity: [Left:Yes] Temperature of Extremity: [Left:Warm] Capillary Refill: [Left:< 3 seconds] Toe Nail Assessment Left: Right: Thick: Yes Discolored: Yes Deformed: Yes Improper Length and Hygiene: Yes Electronic Signature(s) Signed: 04/06/2016 4:46:05 PM By: Elliot Gurney, RN, BSN, Kim RN, BSN Entered By: Elliot Gurney, RN, BSN, Kim on 04/06/2016 10:57:52 Jaime Zuniga ([id number]) -------------------------------------------------------------------------------- Multi Wound Chart  Details Patient Name: Jaime Zuniga Date of Service: 04/06/2016 10:45 AM Medical Record Number: [id number] Patient Account Number: [id number] Date of Birth/Sex: Jan 26, 1951 (65 y.o. Male) Treating RN: Clover Mealy, RN, BSN, Rita Primary Care Physician: Evelene Croon Other Clinician: Referring Physician: Evelene Croon Treating Physician/Extender: Rudene Re in Treatment: 7 Vital Signs Height(in): 72 Pulse(bpm): 66 Weight(lbs): 210 Blood Pressure 97/56 (mmHg): Body Mass Index(BMI): 28 Temperature(F): 97.7 Respiratory Rate 18 (breaths/min): Photos: [N/A:N/A] Wound Location: Left Lower Leg - Anterior, N/A N/A Distal Wounding Event: Gradually Appeared N/A N/A  Primary Etiology: Venous Leg Ulcer N/A N/A Comorbid History: Chronic Obstructive N/A N/A Pulmonary Disease (COPD), Hypertension, Myocardial Infarction, Osteoarthritis, Dementia Date Acquired: 01/26/2016 N/A N/A Weeks of Treatment: 7 N/A N/A Wound Status: Open N/A N/A Measurements L x W x D 1.2x1x0.2 N/A N/A (cm) Area (cm) : 0.942 N/A N/A Volume (cm) : 0.188 N/A N/A % Reduction in Area: 20.00% N/A N/A % Reduction in Volume: 20.30% N/A N/A Classification: Full Thickness Without N/A N/A Exposed Support Structures Exudate Amount: Large N/A N/A Exudate Type: Serosanguineous N/A N/A Jaime Zuniga, Jaime Zuniga ([id number]) Exudate Color: red, brown N/A N/A Wound Margin: Distinct, outline attached N/A N/A Granulation Amount: Large (67-100%) N/A N/A Granulation Quality: Red N/A N/A Necrotic Amount: Small (1-33%) N/A N/A Exposed Structures: Fascia: No N/A N/A Fat: No Tendon: No Muscle: No Joint: No Bone: No Epithelialization: Small (1-33%) N/A N/A Periwound Skin Texture: Scarring: Yes N/A N/A Edema: No Excoriation: No Induration: No Callus: No Crepitus: No Fluctuance: No Friable: No Rash: No Periwound Skin Moist: Yes N/A N/A Moisture: Maceration: No Dry/Scaly: No Periwound Skin Color: Atrophie Blanche: No  N/A N/A Cyanosis: No Ecchymosis: No Erythema: No Hemosiderin Staining: No Mottled: No Pallor: No Rubor: No Temperature: No Abnormality N/A N/A Tenderness on Yes N/A N/A Palpation: Wound Preparation: Ulcer Cleansing: N/A N/A Rinsed/Irrigated with Saline Topical Anesthetic Applied: Other: Lidocaine 4% Cream Treatment Notes Electronic Signature(s) Signed: 04/06/2016 5:32:33 PM By: Elpidio Eric BSN, RN Entered By: Elpidio Eric on 04/06/2016 11:06:48 Jaime Zuniga, Jaime Zuniga ([id number]) Jaime Zuniga, Jaime Zuniga ([id number]) -------------------------------------------------------------------------------- Multi-Disciplinary Care Plan Details Patient Name: Jaime Zuniga Date of Service: 04/06/2016 10:45 AM Medical Record Number: [id number] Patient Account Number: [id number] Date of Birth/Sex: 05/19/1951 (65 y.o. Male) Treating RN: Clover Mealy, RN, BSN, [place] Sink Primary Care Physician: Evelene Croon Other Clinician: Referring Physician: Evelene Croon Treating Physician/Extender: Rudene Re in Treatment: 7 Active Inactive Abuse / Safety / Falls / Self Care Management Nursing Diagnoses: Potential for falls Goals: Patient will remain injury free Date Initiated: 02/11/2016 Goal Status: Active Interventions: Assess fall risk on admission and as needed Assess self care needs on admission and as needed Notes: Nutrition Nursing Diagnoses: Imbalanced nutrition Goals: Patient/caregiver agrees to and verbalizes understanding of need to use nutritional supplements and/or vitamins as prescribed Date Initiated: 02/11/2016 Goal Status: Active Interventions: Assess patient nutrition upon admission and as needed per policy Notes: Orientation to the Wound Care Program Nursing Diagnoses: Knowledge deficit related to the wound healing center program GoalsASENCION, Jaime Zuniga ([id number]) Patient/caregiver will verbalize understanding of the Wound Healing Center Program Date Initiated: 02/11/2016 Goal Status:  Active Interventions: Provide education on orientation to the wound center Notes: Pain, Acute or Chronic Nursing Diagnoses: Pain, acute or chronic: actual or potential Potential alteration in comfort, pain Goals: Patient will verbalize adequate pain control and receive pain control interventions during procedures as needed Date Initiated: 02/11/2016 Goal Status: Active Patient/caregiver will verbalize adequate pain control between visits Date Initiated: 02/11/2016 Goal Status: Active Interventions: Assess comfort goal upon admission Complete pain assessment as per visit requirements Notes: Soft Tissue Infection Nursing Diagnoses: Impaired tissue integrity Goals: Patient/caregiver will verbalize understanding of or measures to prevent infection and contamination in the home setting Date Initiated: 02/11/2016 Goal Status: Active Patient's soft tissue infection will resolve Date Initiated: 02/11/2016 Goal Status: Active Interventions: Assess signs and symptoms of infection every visit Jaime Zuniga, Jaime Zuniga ([id number]) Notes: Wound/Skin Impairment Nursing Diagnoses: Impaired tissue integrity Knowledge deficit related to smoking impact on wound healing Goals: Patient will demonstrate a reduced rate of smoking or cessation of smoking  Date Initiated: 02/11/2016 Goal Status: Active Ulcer/skin breakdown will have a volume reduction of 30% by week 4 Date Initiated: 02/11/2016 Goal Status: Active Ulcer/skin breakdown will have a volume reduction of 50% by week 8 Date Initiated: 02/11/2016 Goal Status: Active Ulcer/skin breakdown will have a volume reduction of 80% by week 12 Date Initiated: 02/11/2016 Goal Status: Active Interventions: Assess ulceration(s) every visit Notes: Electronic Signature(s) Signed: 04/06/2016 5:32:33 PM By: Elpidio Eric BSN, RN Entered By: Elpidio Eric on 04/06/2016 11:06:24 Jaime Zuniga  ([id number]) -------------------------------------------------------------------------------- Pain Assessment Details Patient Name: Jaime Zuniga Date of Service: 04/06/2016 10:45 AM Medical Record Number: [id number] Patient Account Number: [id number] Date of Birth/Sex: 21-Jun-1951 (65 y.o. Male) Treating RN: Huel Coventry Primary Care Physician: Evelene Croon Other Clinician: Referring Physician: Evelene Croon Treating Physician/Extender: Rudene Re in Treatment: 7 Active Problems Location of Pain Severity and Description of Pain Patient Has Paino No Site Locations Pain Management and Medication Current Pain Management: Electronic Signature(s) Signed: 04/06/2016 4:46:05 PM By: Elliot Gurney, RN, BSN, Kim RN, BSN Entered By: Elliot Gurney, RN, BSN, Kim on 04/06/2016 10:56:30 Jaime Zuniga ([id number]) -------------------------------------------------------------------------------- Patient/Caregiver Education Details Patient Name: Jaime Zuniga Date of Service: 04/06/2016 10:45 AM Medical Record Number: [id number] Patient Account Number: [id number] Date of Birth/Gender: 04-15-1951 (65 y.o. Male) Treating RN: Huel Coventry Primary Care Physician: Evelene Croon Other Clinician: Referring Physician: Evelene Croon Treating Physician/Extender: Rudene Re in Treatment: 7 Education Assessment Education Provided To: Patient Education Topics Provided Wound/Skin Impairment: Handouts: Caring for Your Ulcer, Other: wound care as prescribed Methods: Demonstration, Explain/Verbal, Printed Responses: State content correctly Electronic Signature(s) Signed: 04/06/2016 4:46:05 PM By: Elliot Gurney, RN, BSN, Kim RN, BSN Entered By: Elliot Gurney, RN, BSN, Kim on 04/06/2016 11:14:13 Jaime Zuniga ([id number]) -------------------------------------------------------------------------------- Wound Assessment Details Patient Name: Jaime Zuniga Date of Service: 04/06/2016 10:45 AM Medical Record  Number: [id number] Patient Account Number: [id number] Date of Birth/Sex: 04/27/1951 (65 y.o. Male) Treating RN: Huel Coventry Primary Care Physician: Evelene Croon Other Clinician: Referring Physician: Evelene Croon Treating Physician/Extender: Rudene Re in Treatment: 7 Wound Status Wound Number: 2 Primary Venous Leg Ulcer Etiology: Wound Location: Left Lower Leg - Anterior, Distal Wound Open Status: Wounding Event: Gradually Appeared Comorbid Chronic Obstructive Pulmonary Disease Date Acquired: 01/26/2016 History: (COPD), Hypertension, Myocardial Weeks Of Treatment: 7 Infarction, Osteoarthritis, Dementia Clustered Wound: No Photos Wound Measurements Length: (cm) 1.2 Width: (cm) 1 Depth: (cm) 0.2 Area: (cm) 0.942 Volume: (cm) 0.188 % Reduction in Area: 20% % Reduction in Volume: 20.3% Epithelialization: Small (1-33%) Tunneling: No Undermining: No Wound Description Full Thickness Without Exposed Classification: Support Structures Wound Margin: Distinct, outline attached Exudate Large Amount: Exudate Type: Serosanguineous Exudate Color: red, brown Foul Odor After Cleansing: No Wound Bed Granulation Amount: Large (67-100%) Exposed Structure Granulation Quality: Red Fascia Exposed: No Necrotic Amount: Small (1-33%) Fat Layer Exposed: No Jaime Zuniga, Jaime Zuniga ([id number]) Necrotic Quality: Adherent Slough Tendon Exposed: No Muscle Exposed: No Joint Exposed: No Bone Exposed: No Periwound Skin Texture Texture Color No Abnormalities Noted: No No Abnormalities Noted: No Callus: No Atrophie Blanche: No Crepitus: No Cyanosis: No Excoriation: No Ecchymosis: No Fluctuance: No Erythema: No Friable: No Hemosiderin Staining: No Induration: No Mottled: No Localized Edema: No Pallor: No Rash: No Rubor: No Scarring: Yes Temperature / Pain Moisture Temperature: No Abnormality No Abnormalities Noted: No Tenderness on Palpation: Yes Dry / Scaly:  No Maceration: No Moist: Yes Wound Preparation Ulcer Cleansing: Rinsed/Irrigated with Saline Topical Anesthetic Applied: Other: Lidocaine 4% Cream, Treatment Notes Wound #2 (Left, Distal, Anterior Lower Leg) 1. Cleansed with: Clean  wound with Normal Saline 2. Anesthetic Topical Lidocaine 4% cream to wound bed prior to debridement 3. Peri-wound Care: Skin Prep 4. Dressing Applied: Prisma Ag 5. Secondary Dressing Applied Telfa [place] [employer] Electronic Signature(s) Signed: 04/06/2016 4:46:05 PM By: Elliot GurneyWoody, RN, BSN, Kim RN, BSN Entered By: Elliot GurneyWoody, RN, BSN, Kim on 04/06/2016 11:00:29 Jaime Zuniga, Jaime Zuniga ([id number]) -------------------------------------------------------------------------------- Vitals Details Patient Name: Jaime Zuniga, Jaime Zuniga Date of Service: 04/06/2016 10:45 AM Medical Record Number: [id number] Patient Account Number: [id number] Date of Birth/Sex: [birth year] (65 y.o. Male) Treating RN: Huel CoventryWoody, Kim Primary Care Physician: Evelene CroonNiemeyer, Meindert Other Clinician: Referring Physician: Evelene CroonNiemeyer, Meindert Treating Physician/Extender: Rudene ReBritto, Errol Weeks in Treatment: 7 Vital Signs Time Taken: 10:56 Temperature (F): 97.7 Height (in): 72 Pulse (bpm): 66 Weight (lbs): 210 Respiratory Rate (breaths/min): 18 Body Mass Index (BMI): 28.5 Blood Pressure (mmHg): 97/56 Reference Range: 80 - 120 mg / dl Electronic Signature(s) Signed: 04/06/2016 4:46:05 PM By: Elliot GurneyWoody, RN, BSN, Kim RN, BSN Entered By: Elliot GurneyWoody, RN, BSN, Kim on 04/06/2016 10:56:57

## 2016-04-13 ENCOUNTER — Encounter: Payer: Medicare Other | Attending: Surgery | Admitting: Surgery

## 2016-04-13 DIAGNOSIS — K219 Gastro-esophageal reflux disease without esophagitis: Secondary | ICD-10-CM | POA: Diagnosis not present

## 2016-04-13 DIAGNOSIS — I252 Old myocardial infarction: Secondary | ICD-10-CM | POA: Diagnosis not present

## 2016-04-13 DIAGNOSIS — J449 Chronic obstructive pulmonary disease, unspecified: Secondary | ICD-10-CM | POA: Diagnosis not present

## 2016-04-13 DIAGNOSIS — R32 Unspecified urinary incontinence: Secondary | ICD-10-CM | POA: Diagnosis not present

## 2016-04-13 DIAGNOSIS — M61462 Other calcification of muscle, left lower leg: Secondary | ICD-10-CM | POA: Diagnosis not present

## 2016-04-13 DIAGNOSIS — Z87891 Personal history of nicotine dependence: Secondary | ICD-10-CM | POA: Diagnosis not present

## 2016-04-13 DIAGNOSIS — M1990 Unspecified osteoarthritis, unspecified site: Secondary | ICD-10-CM | POA: Insufficient documentation

## 2016-04-13 DIAGNOSIS — I739 Peripheral vascular disease, unspecified: Secondary | ICD-10-CM | POA: Diagnosis not present

## 2016-04-13 DIAGNOSIS — L97222 Non-pressure chronic ulcer of left calf with fat layer exposed: Secondary | ICD-10-CM | POA: Diagnosis present

## 2016-04-13 DIAGNOSIS — I10 Essential (primary) hypertension: Secondary | ICD-10-CM | POA: Insufficient documentation

## 2016-04-13 DIAGNOSIS — Z79899 Other long term (current) drug therapy: Secondary | ICD-10-CM | POA: Insufficient documentation

## 2016-04-13 DIAGNOSIS — F0390 Unspecified dementia without behavioral disturbance: Secondary | ICD-10-CM | POA: Insufficient documentation

## 2016-04-13 DIAGNOSIS — G20 Parkinson's disease: Secondary | ICD-10-CM | POA: Insufficient documentation

## 2016-04-14 NOTE — Progress Notes (Signed)
KIMI, KROFT ([id number]) Visit Report for 04/13/2016 Chief Complaint Document Details Patient Name: Jaime Zuniga, Jaime Zuniga Date of Service: 04/13/2016 2:15 PM Medical Record Number: [id number] Patient Account Number: [id number] Date of Birth/Sex: 02/06/1951 (65 y.o. 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 Male) Treating RN: Huel Coventry Primary Care Physician: Evelene Croon Other Clinician: Referring Physician: Evelene Croon Treating Physician/Extender: Rudene Re in Treatment: 8 History of Present Illness Location: left lower extremity anterior shin area Quality: Patient reports experiencing a dull pain to affected area(s). Severity: Patient states wound are getting worse. Duration: Patient has had the wound for > 3 months prior to seeking treatment at the wound center Timing: Pain in wound is Intermittent (comes and goes Context: The wound appeared gradually over time Modifying Factors: Other treatment(s) tried include:Augmentin and doxycycline Associated Signs and Symptoms: Patient reports having increase swelling. HPI Description: 65 year old gentleman was being seen in the ER recently for wounds on  his left lower extremity which have been draining and there is swelling and he was concerned about wound infection. in the ER x-ray of the left leg was done which showed postsurgical changes but no acute findings.he wrapped his leg in and Unna's boots and referred him to the wound center. earlier in June he had a DVT study done of the left lower extremity which showed no evidence of DVT but there was edema seen. no reflux was seen on that study. in June he was seen by a surgeon Dr. Lemar Livings who advised symptomatic treatment. the patient was also seen at the [facility] he has recently completed courses of Augmentin and doxycycline. 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 02/24/2016 -- seen by Dr. Kirke Corin on 02/17/2016. Lower extremity arterial studies were done which showed a normal ABI bilaterally and normal great toe pressures. Duplex showed diffuse nonobstructive disease and a three-vessel runoff bilaterally. No further vascular studies or intervention was recommended. He was seen by Dr. Romilda Joy on 02/17/2016 and recommended to continue with wound care and no vascular studies intervention was recommended. Electronic Signature(s) Signed: 04/13/2016 2:51:17 PM By: Evlyn Kanner MD, FACS Entered By: Evlyn Kanner on 04/13/2016 14:51:17 Jaime Zuniga ([id number]) -------------------------------------------------------------------------------- Physical Exam Details Patient Name: Jaime Zuniga Date of Service: 04/13/2016 2:15 PM Medical Record Number: [id number] Patient Account Number: [id number] Date of Birth/Sex: 1950/08/22 (65 y.o. Male) Treating RN: Huel Coventry Primary Care Physician: Evelene Croon Other Clinician: Referring Physician: Evelene Croon Treating Physician/Extender: Rudene Re in Treatment: 8 Constitutional . Pulse regular. Respirations  normal and unlabored. Afebrile. . Eyes Nonicteric. Reactive to light. Ears, Nose, Mouth, and Throat Lips, teeth, and gums WNL.Marland Kitchen Moist mucosa without lesions. Neck supple and nontender. No palpable supraclavicular or cervical adenopathy. Normal sized without goiter. Respiratory WNL. No retractions.. Cardiovascular Pedal Pulses WNL. No clubbing, cyanosis or edema. Lymphatic  No adneopathy. No adenopathy. No adenopathy. Musculoskeletal Adexa without tenderness or enlargement.. Digits and nails w/o clubbing, cyanosis, infection, petechiae, ischemia, or inflammatory conditions.. Integumentary (Hair, Skin) No suspicious lesions. No crepitus or fluctuance. No peri-wound warmth or erythema. No masses.Marland Kitchen Psychiatric Judgement and insight Intact.. No evidence of depression, anxiety, or agitation.. Notes there is significant amount of clean granulation tissue but there is also some fibrosis laterally and I was able to wash it out with moist saline gauze and there was minimal bleeding. Electronic Signature(s) Signed: 04/13/2016 2:53:06 PM By: Evlyn Kanner MD, FACS Entered By: Evlyn Kanner on 04/13/2016 14:53:06 Jaime Zuniga ([id number]) -------------------------------------------------------------------------------- Physician Orders Details Patient Name: Jaime Zuniga Date of Service: 04/13/2016 2:15 PM Medical Record Number: [id number] Patient Account Number: [id number] Date of Birth/Sex: [birth year] (65 y.o. Male) Treating RN: Clover Mealy, RN, BSN, [place] Primary Care Physician: Evelene Croon Other Clinician: Referring Physician: Evelene Croon Treating Physician/Extender: Rudene Re in Treatment: 8 Verbal / Phone Orders: Yes Clinician: Afful, RN, BSN, Rita Read Back and Verified: Yes Diagnosis Coding Wound Cleansing Wound #2 Left,Distal,Anterior Lower Leg o Clean wound with Normal Saline. o Cleanse wound with mild soap and water o May Shower, gently pat wound dry prior  to applying new dressing. Anesthetic Wound #2 Left,Distal,Anterior Lower Leg o Topical Lidocaine 4% cream applied to wound bed prior to debridement - for clinic use Skin Barriers/Peri-Wound Care Wound #2 Left,Distal,Anterior Lower Leg o Barrier cream Primary Wound Dressing Wound #2 Left,Distal,Anterior Lower Leg o Prisma Ag - please moisten with saline Secondary Dressing Wound #2 Left,Distal,Anterior Lower Leg o Non-adherent pad - [place] Dressing Change Frequency Wound #2 Left,Distal,Anterior Lower Leg o Change dressing every other day. Follow-up Appointments Wound #2 Left,Distal,Anterior Lower Leg o Return Appointment in 1 week. Edema Control Wound #2 Left,Distal,Anterior Lower Leg o Elevate legs to the level of the heart and pump ankles as often as possible Jaime Zuniga, Jaime Zuniga ([id number]) Additional Orders / Instructions Wound #2 Left,Distal,Anterior Lower Leg o Stop Smoking o Increase protein intake. Home Health Wound #2 Left,Distal,Anterior Lower Leg o Continue Home Health Visits - [facility] o Home Health Nurse may visit PRN to address patientos wound care needs. o FACE TO FACE ENCOUNTER: MEDICARE and MEDICAID PATIENTS: I certify that this patient is under my care and that I had a face-to-face encounter that meets the physician face-to-face encounter requirements with this patient on this date. The encounter with the patient was in whole or in part for the following MEDICAL CONDITION: (primary reason for Home Healthcare) MEDICAL NECESSITY: I certify, that based on my findings, NURSING services are a medically necessary home health service. HOME BOUND STATUS: I certify that my clinical findings support that this patient is homebound (i.e., Due to illness or injury, pt requires aid of supportive devices such as crutches, cane, wheelchairs, walkers, the use of special transportation or the assistance of another person to leave their place of residence.  There is a normal inability to leave the home and doing so requires considerable and taxing effort. Other absences are for medical reasons / religious services and are infrequent or of short duration when for other reasons). o If current dressing causes regression in wound condition, may D/C ordered dressing product/s and apply Normal Saline Moist Dressing daily until next Wound Healing Center / Other MD appointment. Notify Wound Healing Center of regression in wound condition at [phone]. o Please direct any NON-WOUND related issues/requests for orders to patient's Primary Care Physician Electronic Signature(s) Signed: 04/13/2016 4:26:59 PM By: Evlyn Kanner MD,  FACS Signed: 04/13/2016 5:31:48 PM By: Elpidio EricAfful, Rita BSN, RN Entered By: Elpidio EricAfful, Rita on 04/13/2016 14:49:24 Jaime Zuniga, Jaime Zuniga ([id number]) -------------------------------------------------------------------------------- Problem List Details Patient Name: Jaime Zuniga, Jaime Zuniga Date of Service: 04/13/2016 2:15 PM Medical Record Number: [id number] Patient Account Number: [id number] Date of Birth/Sex: 02-08-1951 45(65 y.o. 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 M61.462 Other calcification of muscle, left lower leg 02/11/2016 Yes I73.9 Peripheral vascular disease, unspecified 02/11/2016 Yes Inactive Problems Resolved Problems Electronic Signature(s) Signed: 04/13/2016 2:50:57 PM By: Evlyn KannerBritto, Loretto Belinsky MD, FACS Entered By: Evlyn KannerBritto, Zareth Rippetoe on 04/13/2016 14:50:57 Jaime Zuniga, Jaime Zuniga ([id number]) -------------------------------------------------------------------------------- Progress Note Details Patient Name: Jaime Zuniga, Jaime Zuniga Date of Service: 04/13/2016  2:15 PM Medical Record Number: [id number] Patient Account Number: [id number] Date of Birth/Sex: 02-08-1951 26(65 y.o. 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 Severity: Patient states wound are getting worse. Duration: Patient has had the wound for > 3 months prior to seeking treatment at the wound center Timing: Pain in wound is Intermittent (comes and goes Context: The wound appeared gradually over time Modifying Factors: Other treatment(s) tried include:Augmentin and doxycycline Associated Signs and Symptoms: Patient reports having increase swelling. 65 year old gentleman was being seen in the ER recently for wounds on his left lower extremity which have been draining and there is swelling and he was concerned about wound infection. in the ER x-ray of the left leg was done which showed postsurgical changes but no acute findings.he wrapped his leg in and Unna's boots and referred him to the wound center. earlier in June he had a DVT study done of the left lower extremity which showed no evidence of DVT but there was edema seen. no reflux was seen on that study. in June he was seen by a surgeon Dr. Lemar LivingsByrnett who advised symptomatic treatment. the patient [facility] he has recently completed courses of Augmentin and doxycycline. He is a poor historian but says he had his right  third toe amputated due to gangrene and he may have had some vascular intervention done there at certain stages. 02/24/2016 -- seen by Dr. Kirke CorinArida on 02/17/2016. Lower extremity arterial studies were done which showed a normal ABI bilaterally and normal great toe pressures. Duplex showed diffuse nonobstructive disease and a three-vessel runoff bilaterally. No further vascular studies or intervention was recommended. He was seen by Dr. Romilda JoyMohamed Arida on 02/17/2016 and recommended to continue with wound care and no vascular studies intervention was recommended. [place], Jaime Zuniga ([id number]) Objective Constitutional Pulse regular. Respirations normal and unlabored. Afebrile. Vitals Time Taken: 2:36 PM, Height: 72 in, Weight: 210 lbs, BMI: 28.5, Temperature: 98.2 F, Pulse: 67 bpm, Respiratory Rate: 16 breaths/min, Blood Pressure: 117/64 mmHg. Eyes Nonicteric. Reactive to light. Ears, Nose, Mouth, and Throat Lips, teeth, and gums WNL.Marland Kitchen. Moist mucosa without lesions. Neck supple and nontender. No  palpable supraclavicular or cervical adenopathy. Normal sized without goiter. Respiratory WNL. No retractions.. Cardiovascular Pedal Pulses WNL. No clubbing, cyanosis or edema. Lymphatic No adneopathy. No adenopathy. No adenopathy. Musculoskeletal Adexa without tenderness or enlargement.. Digits and nails w/o clubbing, cyanosis, infection, petechiae, ischemia, or inflammatory conditions.Marland Kitchen Psychiatric Judgement and insight Intact.. No evidence of depression, anxiety, or agitation.. General Notes: there is significant amount of clean granulation tissue but there is also some fibrosis laterally and I was able to wash it out with moist saline gauze and there was minimal bleeding. Integumentary (Hair, Skin) No suspicious lesions. No crepitus or fluctuance. No peri-wound warmth or erythema. No masses.. Wound #2 status is Open. Original cause of wound was Gradually Appeared. The wound is located on  the [facility] Lower Leg. The wound measures 1cm length x 0.7cm width x 0.1cm depth; 0.55cm^2 area and 0.055cm^3 volume. There is no tunneling or undermining noted. There is a large amount of serosanguineous drainage noted. The wound margin is distinct with the outline attached to the wound base. There is small (1-33%) red granulation within the wound bed. There is a large (67-100%) amount of necrotic tissue within the wound bed including Adherent Slough. The periwound skin appearance exhibited: Induration, Scarring, Moist. The periwound skin appearance did not exhibit: Callus, Crepitus, Excoriation, Fluctuance, Friable, Localized Edema, Rash, Dry/Scaly, Maceration, Atrophie Blanche, Cyanosis, Jaime Zuniga, Jaime Zuniga ([id number]) Ecchymosis, Hemosiderin Staining, Mottled, Pallor, Rubor, Erythema. Periwound temperature was noted as No Abnormality. The periwound has tenderness on palpation. Assessment Active Problems ICD-10 L97.222 - Non-pressure chronic ulcer of left calf with fat layer exposed M61.462 - Other calcification of muscle, left lower leg I73.9 - Peripheral vascular disease, unspecified Plan Wound Cleansing: Wound #2 Left,Distal,Anterior Lower Leg: Clean wound with Normal Saline. Cleanse wound with mild soap and water May Shower, gently pat wound dry prior to applying new dressing. Anesthetic: Wound #2 Left,Distal,Anterior Lower Leg: Topical Lidocaine 4% cream applied to wound bed prior to debridement - for clinic use Skin Barriers/Peri-Wound Care: Wound #2 Left,Distal,Anterior Lower Leg: Barrier cream Primary Wound Dressing: Wound #2 Left,Distal,Anterior Lower Leg: Prisma Ag - please moisten with saline Secondary Dressing: Wound #2 Left,Distal,Anterior Lower Leg: Non-adherent pad - [place] Dressing Change Frequency: Wound #2 Left,Distal,Anterior Lower Leg: Change dressing every other day. Follow-up Appointments: Wound #2 Left,Distal,Anterior Lower Leg: Return  Appointment in 1 week. Edema Control: Wound #2 Left,Distal,Anterior Lower Leg: Elevate legs to the level of the heart and pump ankles as often as possible Additional Orders / Instructions: Wound #2 Left,Distal,Anterior Lower Leg: Jaime Zuniga, Jaime Zuniga ([id number]) Stop Smoking Increase protein intake. Home Health: Wound #2 Left,Distal,Anterior Lower Leg: Continue Home Health Visits - [facility] Nurse may visit PRN to address patient s wound care needs. FACE TO FACE ENCOUNTER: MEDICARE and MEDICAID PATIENTS: I certify that this patient is under my care and that I had a face-to-face encounter that meets the physician face-to-face encounter requirements with this patient on this date. The encounter with the patient was in whole or in part for the following MEDICAL CONDITION: (primary reason for Home Healthcare) MEDICAL NECESSITY: I certify, that based on my findings, NURSING services are a medically necessary home health service. HOME BOUND STATUS: I certify that my clinical findings support that this patient is homebound (i.e., Due to illness or injury, pt requires aid of supportive devices such as crutches, cane, wheelchairs, walkers, the use of special transportation or the assistance of another person to leave their place of residence. There is a normal inability to leave the home  and doing so requires considerable and taxing effort. Other absences are for medical reasons / religious services and are infrequent or of short duration when for other reasons). If current dressing causes regression in wound condition, may D/C ordered dressing product/s and apply Normal Saline Moist Dressing daily until next Wound Healing Center / Other MD appointment. Notify Wound Healing Center of regression in wound condition at [phone]. Please direct any NON-WOUND related issues/requests for orders to patient's Primary Care Physician I have recommended: 1. Prisma Ag to the wounds and lightly covered  with a gauze and Kerlix dressing to be changed daily 2. He says has stopped smoking since we spoke to him last 3. regular visits to the wound center Electronic Signature(s) Signed: 04/13/2016 2:53:59 PM By: Evlyn Kanner MD, FACS Entered By: Evlyn Kanner on 04/13/2016 14:53:59 Jaime Zuniga ([id number]) -------------------------------------------------------------------------------- SuperBill Details Patient Name: Jaime Zuniga Date of Service: 04/13/2016 Medical Record Number: [id number] Patient Account Number: [id number] Date of Birth/Sex: 07/12/1951 (65 y.o. Male) Treating RN: Huel Coventry Primary Care Physician: Evelene Croon Other Clinician: Referring Physician: Evelene Croon Treating Physician/Extender: Rudene Re in Treatment: 8 Diagnosis Coding ICD-10 Codes Code Description [phone] Non-pressure chronic ulcer of left calf with fat layer exposed M61.462 Other calcification of muscle, left lower leg I73.9 Peripheral vascular disease, unspecified Facility Procedures CPT4 Code: 32440102 Description: 72536 - WOUND CARE VISIT-LEV 2 EST PT Modifier: Quantity: 1 Physician Procedures CPT4 Code: 6440347 Description: 99213 - WC PHYS LEVEL 3 - EST PT ICD-10 Description Diagnosis L97.222 Non-pressure chronic ulcer of left calf with fat M61.462 Other calcification of muscle, left lower leg I73.9 Peripheral vascular disease, unspecified Modifier: layer exposed Quantity: 1 Electronic Signature(s) Signed: 04/13/2016 5:36:07 PM By: Elpidio Eric BSN, RN Signed: 04/14/2016 7:55:49 AM By: Evlyn Kanner MD, FACS Previous Signature: 04/13/2016 2:54:41 PM Version By: Evlyn Kanner MD, FACS Entered By: Elpidio Eric on 04/13/2016 17:36:06

## 2016-04-14 NOTE — Progress Notes (Signed)
Jaime, Zuniga ([id number]) Visit Report for 04/13/2016 Arrival Information Details Patient Name: Jaime Zuniga, Jaime Zuniga Date of Service: 04/13/2016 2:15 PM Medical Record Number: [id number] Patient Account Number: [id number] Date of Birth/Sex: [birth year] (65 y.o. Male) Treating RN: Huel Coventry Primary Care Physician: Evelene Croon Other Clinician: Referring Physician: Evelene Croon Treating Physician/Extender: Rudene Re in Treatment: 8 Visit Information History Since Last Visit Added or deleted any medications: No Patient Arrived: Dan Humphreys Any new allergies or adverse reactions: No Arrival Time: 14:35 Had a fall or experienced change in No Accompanied By: caregiver activities of daily living that may affect Transfer Assistance: None risk of falls: Patient Identification Verified: Yes Signs or symptoms of abuse/neglect since last No Secondary Verification Process Yes visito Completed: Hospitalized since last visit: No Patient Requires Transmission-Based No Has Dressing in Place as Prescribed: Yes Precautions: Pain Present Now: No Patient Has Alerts: No Electronic Signature(s) Signed: 04/13/2016 3:26:10 PM By: Elliot Gurney, RN, BSN, Kim RN, BSN Entered By: Elliot Gurney, RN, BSN, Kim on 04/13/2016 14:35:49 Jaime Zuniga ([id number]) -------------------------------------------------------------------------------- Clinic Level of Care Assessment Details Patient Name: Jaime Zuniga Date of Service: 04/13/2016 2:15 PM Medical Record Number: [id number] Patient Account Number: [id number] Date of Birth/Sex: 07/31/1951 (65 y.o. Male) Treating RN: Clover Mealy, RN, BSN, Rita Primary Care Physician: Evelene Croon Other Clinician: Referring Physician: Evelene Croon Treating Physician/Extender: Rudene Re in Treatment: 8 Clinic Level of Care Assessment Items TOOL 4 Quantity Score []  - Use when only an EandM is performed on FOLLOW-UP visit 0 ASSESSMENTS - Nursing Assessment /  Reassessment X - Reassessment of Co-morbidities (includes updates in patient status) 1 10 X - Reassessment of Adherence to Treatment Plan 1 5 ASSESSMENTS - Wound and Skin Assessment / Reassessment X - Simple Wound Assessment / Reassessment - one wound 1 5 []  - Complex Wound Assessment / Reassessment - multiple wounds 0 []  - Dermatologic / Skin Assessment (not related to wound area) 0 ASSESSMENTS - Focused Assessment []  - Circumferential Edema Measurements - multi extremities 0 []  - Nutritional Assessment / Counseling / Intervention 0 X - Lower Extremity Assessment (monofilament, tuning fork, pulses) 1 5 []  - Peripheral Arterial Disease Assessment (using hand held doppler) 0 ASSESSMENTS - Ostomy and/or Continence Assessment and Care []  - Incontinence Assessment and Management 0 []  - Ostomy Care Assessment and Management (repouching, etc.) 0 PROCESS - Coordination of Care X - Simple Patient / Family Education for ongoing care 1 15 []  - Complex (extensive) Patient / Family Education for ongoing care 0 []  - Staff obtains Chiropractor, Records, Test Results / Process Orders 0 []  - Staff telephones HHA, Nursing Homes / Clarify orders / etc 0 []  - Routine Transfer to another Facility (non-emergent condition) 0 Marcellus, Spyridon ([id number]) []  - Routine Hospital Admission (non-emergent condition) 0 []  - New Admissions / Manufacturing engineer / Ordering NPWT, Apligraf, etc. 0 []  - Emergency Hospital Admission (emergent condition) 0 []  - Simple Discharge Coordination 0 []  - Complex (extensive) Discharge Coordination 0 PROCESS - Special Needs []  - Pediatric / Minor Patient Management 0 []  - Isolation Patient Management 0 []  - Hearing / Language / Visual special needs 0 []  - Assessment of Community assistance (transportation, D/C planning, etc.) 0 []  - Additional assistance / Altered mentation 0 []  - Support Surface(s) Assessment (bed, cushion, seat, etc.) 0 INTERVENTIONS - Wound Cleansing /  Measurement X - Simple Wound Cleansing - one wound 1 5 []  - Complex Wound Cleansing - multiple wounds 0 X - Wound Imaging (photographs - any number of wounds) 1 5 []  -  Wound Tracing (instead of photographs) 0 X - Simple Wound Measurement - one wound 1 5 []  - Complex Wound Measurement - multiple wounds 0 INTERVENTIONS - Wound Dressings X - Small Wound Dressing one or multiple wounds 1 10 []  - Medium Wound Dressing one or multiple wounds 0 []  - Large Wound Dressing one or multiple wounds 0 []  - Application of Medications - topical 0 []  - Application of Medications - injection 0 INTERVENTIONS - Miscellaneous []  - External ear exam 0 Christopher, Lindel ([id number]) []  - Specimen Collection (cultures, biopsies, blood, body fluids, etc.) 0 []  - Specimen(s) / Culture(s) sent or taken to Lab for analysis 0 []  - Patient Transfer (multiple staff / Michiel Sites Lift / Similar devices) 0 []  - Simple Staple / Suture removal (25 or less) 0 []  - Complex Staple / Suture removal (26 or more) 0 []  - Hypo / Hyperglycemic Management (close monitor of Blood Glucose) 0 []  - Ankle / Brachial Index (ABI) - do not check if billed separately 0 X - Vital Signs 1 5 Has the patient been seen at the hospital within the last three years: Yes Total Score: 70 Level Of Care: New/Established - Level 2 Electronic Signature(s) Signed: 04/13/2016 6:01:46 PM By: Elpidio Eric BSN, RN Entered By: Elpidio Eric on 04/13/2016 17:35:51 Jaime Zuniga ([id number]) -------------------------------------------------------------------------------- Encounter Discharge Information Details Patient Name: Jaime Zuniga Date of Service: 04/13/2016 2:15 PM Medical Record Number: [id number] Patient Account Number: [id number] Date of Birth/Sex: [birth year] (65 y.o. Male) Treating RN: Huel Coventry Primary Care Physician: Evelene Croon Other Clinician: Referring Physician: Evelene Croon Treating Physician/Extender: Rudene Re in Treatment:  8 Encounter Discharge Information Items Discharge Pain Level: 0 Discharge Condition: Stable Ambulatory Status: Walker Discharge Destination: Home Transportation: Private Auto Accompanied By: self Schedule Follow-up Appointment: Yes Medication Reconciliation completed Yes and provided to Patient/Care Yunus Stoklosa: Provided on Clinical Summary of Care: 04/13/2016 Form Type Recipient Paper Patient SB Electronic Signature(s) Signed: 04/13/2016 3:26:10 PM By: Elliot Gurney, RN, BSN, Kim RN, BSN Previous Signature: 04/13/2016 2:52:25 PM Version By: Gwenlyn Perking Entered By: Elliot Gurney RN, BSN, Kim on 04/13/2016 14:53:48 Jaime Zuniga ([id number]) -------------------------------------------------------------------------------- Lower Extremity Assessment Details Patient Name: Jaime Zuniga Date of Service: 04/13/2016 2:15 PM Medical Record Number: [id number] Patient Account Number: [id number] Date of Birth/Sex: 09-Nov-1950 (65 y.o. Male) Treating RN: Huel Coventry Primary Care Physician: Evelene Croon Other Clinician: Referring Physician: Evelene Croon Treating Physician/Extender: Rudene Re in Treatment: 8 Vascular Assessment Pulses: Posterior Tibial Dorsalis Pedis Palpable: [Left:Yes] Extremity colors, hair growth, and conditions: Extremity Color: [Left:Hyperpigmented] Hair Growth on Extremity: [Left:Yes] Temperature of Extremity: [Left:Warm] Capillary Refill: [Left:< 3 seconds] Dependent Rubor: [Left:No] Blanched when Elevated: [Left:No] Lipodermatosclerosis: [Left:Yes] Toe Nail Assessment Left: Right: Thick: Yes Discolored: Yes Deformed: No Improper Length and Hygiene: Yes Electronic Signature(s) Signed: 04/13/2016 3:26:10 PM By: Elliot Gurney, RN, BSN, Kim RN, BSN Entered By: Elliot Gurney, RN, BSN, Kim on 04/13/2016 14:39:18 Jaime Zuniga ([id number]) -------------------------------------------------------------------------------- Multi Wound Chart Details Patient Name: Jaime Zuniga Date  of Service: 04/13/2016 2:15 PM Medical Record Number: [id number] Patient Account Number: [id number] Date of Birth/Sex: 02-06-1951 (65 y.o. Male) Treating RN: Clover Mealy, RN, BSN, Rita Primary Care Physician: Evelene Croon Other Clinician: Referring Physician: Evelene Croon Treating Physician/Extender: Rudene Re in Treatment: 8 Vital Signs Height(in): 72 Pulse(bpm): 67 Weight(lbs): 210 Blood Pressure 117/64 (mmHg): Body Mass Index(BMI): 28 Temperature(F): 98.2 Respiratory Rate 16 (breaths/min): Photos: [N/A:N/A] Wound Location: Left Lower Leg - Anterior, N/A N/A Distal Wounding Event: Gradually Appeared N/A N/A Primary Etiology: Venous Leg Ulcer N/A N/A  Comorbid History: Chronic Obstructive N/A N/A Pulmonary Disease (COPD), Hypertension, Myocardial Infarction, Osteoarthritis, Dementia Date Acquired: 01/26/2016 N/A N/A Weeks of Treatment: 8 N/A N/A Wound Status: Open N/A N/A Measurements L x W x D 1x0.7x0.1 N/A N/A (cm) Area (cm) : 0.55 N/A N/A Volume (cm) : 0.055 N/A N/A % Reduction in Area: 53.30% N/A N/A % Reduction in Volume: 76.70% N/A N/A Classification: Full Thickness Without N/A N/A Exposed Support Structures Exudate Amount: Large N/A N/A Exudate Type: Serosanguineous N/A N/A Cranmer, Random ([id number]) Exudate Color: red, brown N/A N/A Wound Margin: Distinct, outline attached N/A N/A Granulation Amount: Small (1-33%) N/A N/A Granulation Quality: Red N/A N/A Necrotic Amount: Large (67-100%) N/A N/A Exposed Structures: Fascia: No N/A N/A Fat: No Tendon: No Muscle: No Joint: No Bone: No Epithelialization: Small (1-33%) N/A N/A Periwound Skin Texture: Induration: Yes N/A N/A Scarring: Yes Edema: No Excoriation: No Callus: No Crepitus: No Fluctuance: No Friable: No Rash: No Periwound Skin Moist: Yes N/A N/A Moisture: Maceration: No Dry/Scaly: No Periwound Skin Color: Atrophie Blanche: No N/A N/A Cyanosis: No Ecchymosis:  No Erythema: No Hemosiderin Staining: No Mottled: No Pallor: No Rubor: No Temperature: No Abnormality N/A N/A Tenderness on Yes N/A N/A Palpation: Wound Preparation: Ulcer Cleansing: N/A N/A Rinsed/Irrigated with Saline Topical Anesthetic Applied: Other: Lidocaine 4% Cream Treatment Notes Electronic Signature(s) Signed: 04/13/2016 5:31:48 PM By: Elpidio Eric BSN, RN Entered By: Elpidio Eric on 04/13/2016 14:45:57 FRANCISCO, OSTROVSKY ([id number]) TOFT, Irene Limbo ([id number]) -------------------------------------------------------------------------------- Multi-Disciplinary Care Plan Details Patient Name: Jaime Zuniga Date of Service: 04/13/2016 2:15 PM Medical Record Number: [id number] Patient Account Number: [id number] Date of Birth/Sex: 09/10/1950 (65 y.o. Male) Treating RN: Clover Mealy, RN, BSN, [place] Sink Primary Care Physician: Evelene Croon Other Clinician: Referring Physician: Evelene Croon Treating Physician/Extender: Rudene Re in Treatment: 8 Active Inactive Abuse / Safety / Falls / Self Care Management Nursing Diagnoses: Potential for falls Goals: Patient will remain injury free Date Initiated: 02/11/2016 Goal Status: Active Interventions: Assess fall risk on admission and as needed Assess self care needs on admission and as needed Notes: Nutrition Nursing Diagnoses: Imbalanced nutrition Goals: Patient/caregiver agrees to and verbalizes understanding of need to use nutritional supplements and/or vitamins as prescribed Date Initiated: 02/11/2016 Goal Status: Active Interventions: Assess patient nutrition upon admission and as needed per policy Notes: Orientation to the Wound Care Program Nursing Diagnoses: Knowledge deficit related to the wound healing center program GoalsLINDEL, MARCELL ([id number]) Patient/caregiver will verbalize understanding of the Wound Healing Center Program Date Initiated: 02/11/2016 Goal Status: Active Interventions: Provide  education on orientation to the wound center Notes: Pain, Acute or Chronic Nursing Diagnoses: Pain, acute or chronic: actual or potential Potential alteration in comfort, pain Goals: Patient will verbalize adequate pain control and receive pain control interventions during procedures as needed Date Initiated: 02/11/2016 Goal Status: Active Patient/caregiver will verbalize adequate pain control between visits Date Initiated: 02/11/2016 Goal Status: Active Interventions: Assess comfort goal upon admission Complete pain assessment as per visit requirements Notes: Soft Tissue Infection Nursing Diagnoses: Impaired tissue integrity Goals: Patient/caregiver will verbalize understanding of or measures to prevent infection and contamination in the home setting Date Initiated: 02/11/2016 Goal Status: Active Patient's soft tissue infection will resolve Date Initiated: 02/11/2016 Goal Status: Active Interventions: Assess signs and symptoms of infection every visit JEREMIAN, WHITBY ([id number]) Notes: Wound/Skin Impairment Nursing Diagnoses: Impaired tissue integrity Knowledge deficit related to smoking impact on wound healing Goals: Patient will demonstrate a reduced rate of smoking or cessation of smoking Date Initiated: 02/11/2016 Goal Status: Active Ulcer/skin  breakdown will have a volume reduction of 30% by week 4 Date Initiated: 02/11/2016 Goal Status: Active Ulcer/skin breakdown will have a volume reduction of 50% by week 8 Date Initiated: 02/11/2016 Goal Status: Active Ulcer/skin breakdown will have a volume reduction of 80% by week 12 Date Initiated: 02/11/2016 Goal Status: Active Interventions: Assess ulceration(s) every visit Notes: Electronic Signature(s) Signed: 04/13/2016 5:31:48 PM By: Elpidio EricAfful, Rita BSN, RN Entered By: Elpidio EricAfful, Rita on 04/13/2016 14:45:46 Jaime MaclachlanBURTON, Zaquan ([id number]) -------------------------------------------------------------------------------- Pain Assessment  Details Patient Name: Jaime MaclachlanBURTON, Jaquawn Date of Service: 04/13/2016 2:15 PM Medical Record Number: [id number] Patient Account Number: [id number] Date of Birth/Sex: 02/17/1951 [age] y.o. Male) Treating RN: Huel CoventryWoody, Kim Primary Care Physician: Evelene CroonNiemeyer, Meindert Other Clinician: Referring Physician: Evelene CroonNiemeyer, Meindert Treating Physician/Extender: Rudene ReBritto, Errol Weeks in Treatment: 8 Active Problems Location of Pain Severity and Description of Pain Patient Has Paino No Site Locations With Dressing Change: No Pain Management and Medication Current Pain Management: Electronic Signature(s) Signed: 04/13/2016 3:26:10 PM By: Elliot GurneyWoody, RN, BSN, Kim RN, BSN Entered By: Elliot GurneyWoody, RN, BSN, Kim on 04/13/2016 14:36:30 Jaime MaclachlanBURTON, Phillippe ([id number]) -------------------------------------------------------------------------------- Patient/Caregiver Education Details Patient Name: Jaime MaclachlanBURTON, Rebekah Date of Service: 04/13/2016 2:15 PM Medical Record Number: [id number] Patient Account Number: [id number] Date of Birth/Gender: 02/17/1951 (65 y.o. Male) Treating RN: Huel CoventryWoody, Kim Primary Care Physician: Evelene CroonNiemeyer, Meindert Other Clinician: Referring Physician: Evelene CroonNiemeyer, Meindert Treating Physician/Extender: Rudene ReBritto, Errol Weeks in Treatment: 8 Education Assessment Education Provided To: Patient Education Topics Provided Wound/Skin Impairment: Handouts: Caring for Your Ulcer Methods: Demonstration Responses: State content correctly Electronic Signature(s) Signed: 04/13/2016 3:26:10 PM By: Elliot GurneyWoody, RN, BSN, Kim RN, BSN Entered By: Elliot GurneyWoody, RN, BSN, Kim on 04/13/2016 14:54:08 Jaime MaclachlanBURTON, Ociel ([id number]) -------------------------------------------------------------------------------- Wound Assessment Details Patient Name: Jaime MaclachlanBURTON, Zebadiah Date of Service: 04/13/2016 2:15 PM Medical Record Number: [id number] Patient Account Number: [id number] Date of Birth/Sex: 02/17/1951 (65 y.o. Male) Treating RN: Huel CoventryWoody, Kim Primary Care Physician: Evelene CroonNiemeyer,  Meindert Other Clinician: Referring Physician: Evelene CroonNiemeyer, Meindert Treating Physician/Extender: Rudene ReBritto, Errol Weeks in Treatment: 8 Wound Status Wound Number: 2 Primary Venous Leg Ulcer Etiology: Wound Location: Left Lower Leg - Anterior, Distal Wound Open Status: Wounding Event: Gradually Appeared Comorbid Chronic Obstructive Pulmonary Disease Date Acquired: 01/26/2016 History: (COPD), Hypertension, Myocardial Weeks Of Treatment: 8 Infarction, Osteoarthritis, Dementia Clustered Wound: No Photos Wound Measurements Length: (cm) 1 Width: (cm) 0.7 Depth: (cm) 0.1 Area: (cm) 0.55 Volume: (cm) 0.055 % Reduction in Area: 53.3% % Reduction in Volume: 76.7% Epithelialization: Small (1-33%) Tunneling: No Undermining: No Wound Description Full Thickness Without Exposed Classification: Support Structures Wound Margin: Distinct, outline attached Exudate Large Amount: Exudate Type: Serosanguineous Exudate Color: red, brown Foul Odor After Cleansing: No Wound Bed Granulation Amount: Small (1-33%) Exposed Structure Granulation Quality: Red Fascia Exposed: No Necrotic Amount: Large (67-100%) Fat Layer Exposed: No Botting, Dasani ([id number]) Necrotic Quality: Adherent Slough Tendon Exposed: No Muscle Exposed: No Joint Exposed: No Bone Exposed: No Periwound Skin Texture Texture Color No Abnormalities Noted: No No Abnormalities Noted: No Callus: No Atrophie Blanche: No Crepitus: No Cyanosis: No Excoriation: No Ecchymosis: No Fluctuance: No Erythema: No Friable: No Hemosiderin Staining: No Induration: Yes Mottled: No Localized Edema: No Pallor: No Rash: No Rubor: No Scarring: Yes Temperature / Pain Moisture Temperature: No Abnormality No Abnormalities Noted: No Tenderness on Palpation: Yes Dry / Scaly: No Maceration: No Moist: Yes Wound Preparation Ulcer Cleansing: Rinsed/Irrigated with Saline Topical Anesthetic Applied: Other: Lidocaine 4%  Cream, Treatment Notes Wound #2 (Left, Distal, Anterior Lower Leg) 1. Cleansed with: Clean wound with Normal Saline 2. Anesthetic Topical Lidocaine 4% cream  to wound bed prior to debridement 4. Dressing Applied: Prisma Ag 5. Secondary Dressing Applied [employer]) Signed: 04/13/2016 3:26:10 PM By: Elliot Gurney, RN, BSN, Kim RN, BSN Entered By: Elliot Gurney, RN, BSN, Kim on 04/13/2016 14:40:30 Jaime Zuniga ([id number]) -------------------------------------------------------------------------------- Vitals Details Patient Name: Jaime Zuniga Date of Service: 04/13/2016 2:15 PM Medical Record Number: [id number] Patient Account Number: [id number] Date of Birth/Sex: May 10, 1951 (65 y.o. Male) Treating RN: Huel Coventry Primary Care Physician: Evelene Croon Other Clinician: Referring Physician: Evelene Croon Treating Physician/Extender: Rudene Re in Treatment: 8 Vital Signs Time Taken: 14:36 Temperature (F): 98.2 Height (in): 72 Pulse (bpm): 67 Weight (lbs): 210 Respiratory Rate (breaths/min): 16 Body Mass Index (BMI): 28.5 Blood Pressure (mmHg): 117/64 Reference Range: 80 - 120 mg / dl Electronic Signature(s) Signed: 04/13/2016 3:26:10 PM By: Elliot Gurney, RN, BSN, Kim RN, BSN Entered By: Elliot Gurney, RN, BSN, Kim on 04/13/2016 14:36:49

## 2016-04-20 ENCOUNTER — Encounter: Payer: Medicare Other | Admitting: Surgery

## 2016-04-20 DIAGNOSIS — L97222 Non-pressure chronic ulcer of left calf with fat layer exposed: Secondary | ICD-10-CM | POA: Diagnosis not present

## 2016-04-21 NOTE — Progress Notes (Signed)
Jaime Zuniga, Jaime Zuniga ([id number]) Visit Report for 04/20/2016 Chief Complaint Document Details Patient Name: DEJUAN, ELMAN Date of Service: 04/20/2016 12:45 PM Medical Record Number: [id number] Patient Account Number: [id number] Date of Birth/Sex: [birth year] (65 y.o. 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 Male) Treating RN: Huel Coventry Primary Care Physician: Evelene Croon Other Clinician: Referring Physician: Evelene Croon Treating Physician/Extender: Rudene Re in Treatment: 9 History of Present Illness Location: left lower extremity anterior shin area Quality: Patient reports experiencing a dull pain to affected area(s). Severity: Patient states wound are getting worse. Duration: Patient has had the wound for > 3 months prior to seeking treatment at the wound center Timing: Pain in wound is Intermittent (comes and goes Context: The wound appeared gradually over time Modifying Factors: Other treatment(s) tried include:Augmentin and doxycycline Associated Signs and Symptoms: Patient reports having increase swelling. HPI Description: 65 year old gentleman was being seen in the ER recently for  wounds on his left lower extremity which have been draining and there is swelling and he was concerned about wound infection. in the ER x-ray of the left leg was done which showed postsurgical changes but no acute findings.he wrapped his leg in and Unna's boots and referred him to the wound center. earlier in June he had a DVT study done of the left lower extremity which showed no evidence of DVT but there was edema seen. no reflux was seen on that study. in June he was seen by a surgeon Dr. Lemar Livings who advised symptomatic treatment. the patient was also seen at the [facility] he has recently completed courses of Augmentin and doxycycline. 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 02/24/2016 -- seen by Dr. Kirke Corin on 02/17/2016. Lower extremity arterial studies were done which showed a normal ABI bilaterally and normal great toe pressures. Duplex showed diffuse nonobstructive disease and a three-vessel runoff bilaterally. No further vascular studies or intervention was recommended. He was seen by Dr. Romilda Joy on 02/17/2016 and recommended to continue with wound care and no vascular studies intervention was recommended. Electronic Signature(s) Signed: 04/20/2016 1:34:49 PM By: Evlyn Kanner MD, FACS Entered By: Evlyn Kanner on 04/20/2016 13:34:49 Jaime Zuniga ([id number]) -------------------------------------------------------------------------------- Physical Exam Details Patient Name: Jaime Zuniga Date of Service: 04/20/2016 12:45 PM Medical Record Number: [id number] Patient Account Number: [id number] Date of Birth/Sex: 1951/06/16 (65 y.o. Male) Treating RN: Huel Coventry Primary Care Physician: Evelene Croon Other Clinician: Referring Physician: Evelene Croon Treating Physician/Extender: Rudene Re in Treatment: 9 Constitutional . Pulse regular.  Respirations normal and unlabored. Afebrile. . Eyes Nonicteric. Reactive to light. Ears, Nose, Mouth, and Throat Lips, teeth, and gums WNL.Marland Kitchen Moist mucosa without lesions. Neck supple and nontender. No palpable supraclavicular or cervical adenopathy. Normal sized without goiter. Respiratory WNL. No retractions.. Breath sounds WNL, No rubs, rales, rhonchi, or wheeze.. Cardiovascular  Heart rhythm and rate regular, no murmur or gallop.. Pedal Pulses WNL. No clubbing, cyanosis or edema. Lymphatic No adneopathy. No adenopathy. No adenopathy. Musculoskeletal Adexa without tenderness or enlargement.. Digits and nails w/o clubbing, cyanosis, infection, petechiae, ischemia, or inflammatory conditions.. Integumentary (Hair, Skin) No suspicious lesions. No crepitus or fluctuance. No peri-wound warmth or erythema. No masses.Marland Kitchen Psychiatric Judgement and insight Intact.. No evidence of depression, anxiety, or agitation.. Notes the patient has a lot of lymphedema today and other than that the wound itself does not have any slough and no sharp debridement was required. Electronic Signature(s) Signed: 04/20/2016 1:35:26 PM By: Evlyn Kanner MD, FACS Entered By: Evlyn Kanner on 04/20/2016 13:35:25 Jaime Zuniga ([id number]) -------------------------------------------------------------------------------- Physician Orders Details Patient Name: Jaime Zuniga Date of Service: 04/20/2016 12:45 PM Medical Record Number: [id number] Patient Account Number: [id number] Date of Birth/Sex: [birth year] (65 y.o. Male) Treating RN: Clover Mealy, RN, BSN, Conception Sink Primary Care Physician: Evelene Croon Other Clinician: Referring Physician: Evelene Croon Treating Physician/Extender: Rudene Re in Treatment: 9 Verbal / Phone Orders: Yes Clinician: Afful, RN, BSN, Rita Read Back and Verified: Yes Diagnosis Coding Wound Cleansing Wound #2 Left,Distal,Anterior Lower Leg o Clean wound with Normal Saline. o  Cleanse wound with mild soap and water o May Shower, gently pat wound dry prior to applying new dressing. Anesthetic Wound #2 Left,Distal,Anterior Lower Leg o Topical Lidocaine 4% cream applied to wound bed prior to debridement - for clinic use Skin Barriers/Peri-Wound Care Wound #2 Left,Distal,Anterior Lower Leg o Barrier cream Primary Wound Dressing Wound #2 Left,Distal,Anterior Lower Leg o Prisma Ag - please moisten with saline Secondary Dressing Wound #2 Left,Distal,Anterior Lower Leg o Non-adherent pad - [place] Dressing Change Frequency Wound #2 Left,Distal,Anterior Lower Leg o Change dressing every other day. Follow-up Appointments Wound #2 Left,Distal,Anterior Lower Leg o Return Appointment in 1 week. Edema Control Wound #2 Left,Distal,Anterior Lower Leg o Patient to wear own compression stockings o Elevate legs to the level of the heart and pump ankles as often as possible Kegley, Kinsley ([id number]) o Support Garment 20-30 mm/Hg pressure to: - HOME HEALTH TO PLEASE ORDER STOCKINGS FOR PATIENT. Additional Orders / Instructions Wound #2 Left,Distal,Anterior Lower Leg o Stop Smoking o Increase protein intake. Home Health Wound #2 Left,Distal,Anterior Lower Leg o Continue Home Health Visits - [facility] o Home Health Nurse may visit PRN to address patientos wound care needs. - HOME HEALTH TO PLEASE ORDER COMPRESSION STOCKINGS o FACE TO FACE ENCOUNTER: MEDICARE and MEDICAID PATIENTS: I certify that this patient is under my care and that I had a face-to-face encounter that meets the physician face-to-face encounter requirements with this patient on this date. The encounter with the patient was in whole or in part for the following MEDICAL CONDITION: (primary reason for Home Healthcare) MEDICAL NECESSITY: I certify, that based on my findings, NURSING services are a medically necessary home health service. HOME BOUND STATUS: I certify that my  clinical findings support that this patient is homebound (i.e., Due to illness or injury, pt requires aid of supportive devices such as crutches, cane, wheelchairs, walkers, the use of special transportation or the assistance of another person to leave their place of residence. There is a normal inability to leave the home and doing so requires considerable and taxing effort. Other absences are for medical reasons / religious services and are infrequent or of short duration when for other reasons). o If current dressing causes regression in wound condition, may D/C ordered dressing product/s and apply Normal Saline Moist Dressing daily until  next Wound Healing Center / Other MD appointment. Notify Wound Healing Center of regression in wound condition at [phone]. o Please direct any NON-WOUND related issues/requests for orders to patient's Primary Care Physician Electronic Signature(s) Signed: 04/20/2016 3:58:34 PM By: Evlyn KannerBritto, Katharine Rochefort MD, FACS Signed: 04/20/2016 5:04:09 PM By: Elpidio EricAfful, Rita BSN, RN Entered By: Elpidio EricAfful, Rita on 04/20/2016 13:31:04 Jaime MaclachlanBURTON, Jaime ([id number]) -------------------------------------------------------------------------------- Problem List Details Patient Name: Jaime MaclachlanBURTON, Jaime Date of Service: 04/20/2016 12:45 PM Medical Record Number: [id number] Patient Account Number: [id number] Date of Birth/Sex: 1950/09/21 (65 y.o. 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 M61.462 Other calcification of muscle, left lower leg 02/11/2016 Yes I73.9 Peripheral vascular disease, unspecified 02/11/2016 Yes I89.0 Lymphedema, not elsewhere classified 04/20/2016 Yes Inactive  Problems Resolved Problems Electronic Signature(s) Signed: 04/20/2016 1:35:53 PM By: Evlyn KannerBritto, Arianis Bowditch MD, FACS Previous Signature: 04/20/2016 1:34:23 PM Version By: Evlyn KannerBritto, Navil Kole MD, FACS Entered By: Evlyn KannerBritto, Luvern Mcisaac on 04/20/2016 13:35:53 Jaime MaclachlanBURTON, Jaime ([id number]) -------------------------------------------------------------------------------- Progress Note Details Patient Name: Jaime MaclachlanBURTON, Jaime Date of Service: 04/20/2016 12:45 PM Medical Record Number: [id number] Patient Account Number: [id number] Date of Birth/Sex: 1950/09/21 54(65 y.o. 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 Severity: Patient states wound are getting worse. Duration: Patient has had the wound for > 3 months prior to seeking treatment at the wound center Timing: Pain in wound is Intermittent (comes and goes Context: The wound appeared gradually over time Modifying Factors: Other treatment(s) tried include:Augmentin and doxycycline Associated Signs and Symptoms: Patient reports having increase swelling. 65 year old gentleman was being seen in the ER recently for wounds on his left lower extremity which have been draining and there is swelling and he was concerned about wound infection. in the ER x-ray of the left leg was done which showed postsurgical changes but no acute findings.he wrapped his leg in and Unna's boots and referred him  to the wound center. earlier in June he had a DVT study done of the left lower extremity which showed no evidence of DVT but there was edema seen. no reflux was seen on that study. in June he was seen by a surgeon Dr. Lemar LivingsByrnett who advised symptomatic treatment. the patient was [facility] he has recently completed courses of Augmentin and doxycycline. 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 02/24/2016 -- seen by Dr. Kirke CorinArida on 02/17/2016. Lower extremity arterial studies were done which showed a normal ABI bilaterally and normal great toe pressures. Duplex showed diffuse nonobstructive disease and a three-vessel runoff bilaterally. No further vascular studies or intervention was recommended. He was seen by Dr. Romilda JoyMohamed Arida on 02/17/2016 and recommended to continue with wound care and no vascular studies intervention was recommended. Zuniga, Jaime (  [id number]) Objective Constitutional Pulse regular. Respirations normal and unlabored. Afebrile. Vitals Time Taken: 1:05 PM, Height: 72 in, Weight: 210 lbs, BMI: 28.5, Temperature: 97.7 F, Respiratory Rate: 16 breaths/min, Blood Pressure: 106/76 mmHg. Eyes Nonicteric. Reactive to light. Ears, Nose, Mouth, and Throat Lips, teeth, and gums WNL.Marland Kitchen Moist mucosa without lesions. Neck supple and nontender. No palpable supraclavicular or cervical adenopathy. Normal sized without goiter. Respiratory WNL. No retractions.. Breath sounds WNL, No rubs, rales, rhonchi, or wheeze.. Cardiovascular Heart rhythm and rate regular, no murmur or gallop.. Pedal Pulses WNL. No clubbing, cyanosis or edema. Lymphatic No adneopathy. No adenopathy. No adenopathy. Musculoskeletal Adexa without tenderness or enlargement.. Digits and nails w/o clubbing, cyanosis, infection, petechiae, ischemia, or inflammatory  conditions.Marland Kitchen Psychiatric Judgement and insight Intact.. No evidence of depression, anxiety, or agitation.. General Notes: the patient has a lot of lymphedema today and other than that the wound itself does not have any slough and no sharp debridement was required. Integumentary (Hair, Skin) No suspicious lesions. No crepitus or fluctuance. No peri-wound warmth or erythema. No masses.. Wound #2 status is Open. Original cause of wound was Gradually Appeared. The wound is located on the [facility] Lower Leg. The wound measures 1.4cm length x 0.7cm width x 0.1cm depth; 0.77cm^2 area and 0.077cm^3 volume. There is no tunneling or undermining noted. There is a large amount of serosanguineous drainage noted. The wound margin is distinct with the outline attached to the wound base. There is medium (34-66%) pink granulation within the wound bed. There is a medium (34-66%) amount of necrotic tissue within the wound bed including Adherent Slough. The periwound skin appearance exhibited: Induration, Scarring, Moist. The periwound skin appearance did not exhibit: Callus, Crepitus, Excoriation, Fluctuance, Friable, Localized Edema, Rash, Dry/Scaly, Maceration, Atrophie Blanche, Cyanosis, Macnaughton, Overton ([id number]) Ecchymosis, Hemosiderin Staining, Mottled, Pallor, Rubor, Erythema. Periwound temperature was noted as No Abnormality. The periwound has tenderness on palpation. Assessment Active Problems ICD-10 L97.222 - Non-pressure chronic ulcer of left calf with fat layer exposed M61.462 - Other calcification of muscle, left lower leg I73.9 - Peripheral vascular disease, unspecified I89.0 - Lymphedema, not elsewhere classified Plan Wound Cleansing: Wound #2 Left,Distal,Anterior Lower Leg: Clean wound with Normal Saline. Cleanse wound with mild soap and water May Shower, gently pat wound dry prior to applying new dressing. Anesthetic: Wound #2 Left,Distal,Anterior Lower Leg: Topical  Lidocaine 4% cream applied to wound bed prior to debridement - for clinic use Skin Barriers/Peri-Wound Care: Wound #2 Left,Distal,Anterior Lower Leg: Barrier cream Primary Wound Dressing: Wound #2 Left,Distal,Anterior Lower Leg: Prisma Ag - please moisten with saline Secondary Dressing: Wound #2 Left,Distal,Anterior Lower Leg: Non-adherent pad - [place] Dressing Change Frequency: Wound #2 Left,Distal,Anterior Lower Leg: Change dressing every other day. Follow-up Appointments: Wound #2 Left,Distal,Anterior Lower Leg: Return Appointment in 1 week. Edema Control: Wound #2 Left,Distal,Anterior Lower Leg: Patient to wear own compression stockings Elevate legs to the level of the heart and pump ankles as often as possible Zuniga, Jaime ([id number]) Support Garment 20-30 mm/Hg pressure to: - HOME HEALTH TO PLEASE ORDER STOCKINGS FOR PATIENT. Additional Orders / Instructions: Wound #2 Left,Distal,Anterior Lower Leg: Stop Smoking Increase protein intake. Home Health: Wound #2 Left,Distal,Anterior Lower Leg: Continue Home Health Visits - [facility] Nurse may visit PRN to address patient s wound care needs. - HOME HEALTH TO PLEASE ORDER COMPRESSION STOCKINGS FACE TO FACE ENCOUNTER: MEDICARE and MEDICAID PATIENTS: I certify that this patient is under my care and that I had a face-to-face encounter that meets the physician face-to-face encounter  requirements with this patient on this date. The encounter with the patient was in whole or in part for the following MEDICAL CONDITION: (primary reason for Home Healthcare) MEDICAL NECESSITY: I certify, that based on my findings, NURSING services are a medically necessary home health service. HOME BOUND STATUS: I certify that my clinical findings support that this patient is homebound (i.e., Due to illness or injury, pt requires aid of supportive devices such as crutches, cane, wheelchairs, walkers, the use of special transportation  or the assistance of another person to leave their place of residence. There is a normal inability to leave the home and doing so requires considerable and taxing effort. Other absences are for medical reasons / religious services and are infrequent or of short duration when for other reasons). If current dressing causes regression in wound condition, may D/C ordered dressing product/s and apply Normal Saline Moist Dressing daily until next Wound Healing Center / Other MD appointment. Notify Wound Healing Center of regression in wound condition at [phone]. Please direct any NON-WOUND related issues/requests for orders to patient's Primary Care Physician due to an issue with the pharmacy he may have been out of his medications for a few days but his lymphedema on the leftt lower extremity is quite significant. I have asked him to elevate his leg as much as possible. I have recommended: 1. Prisma Ag to the wounds and lightly covered with a gauze and Kerlix dressing to be changed daily. 2. for his Lymphedema, he is to use compression stockings 20-30 mm 3. He says has stopped smoking since we spoke to him last 4. regular visits to the wound center Electronic Signature(s) Signed: 04/20/2016 1:38:12 PM By: Evlyn Kanner MD, FACS Entered By: Evlyn Kanner on 04/20/2016 13:38:11 Jaime Zuniga ([id number]) -------------------------------------------------------------------------------- SuperBill Details Patient Name: Jaime Zuniga Date of Service: 04/20/2016 Medical Record Number: [id number] Patient Account Number: [id number] Date of Birth/Sex: 1951/07/02 (65 y.o. Male) Treating RN: Huel Coventry Primary Care Physician: Evelene Croon Other Clinician: Referring Physician: Evelene Croon Treating Physician/Extender: Rudene Re in Treatment: 9 Diagnosis Coding ICD-10 Codes Code Description [id number] Non-pressure chronic ulcer of left calf with fat layer exposed M61.462 Other  calcification of muscle, left lower leg I73.9 Peripheral vascular disease, unspecified I89.0 Lymphedema, not elsewhere classified Facility Procedures CPT4 Code: [id number] Description: 99213 - WOUND CARE VISIT-LEV 3 EST PT Modifier: Quantity: 1 Physician Procedures CPT4 Code: [id number] Description: 99213 - WC PHYS LEVEL 3 - EST PT ICD-10 Description Diagnosis L97.222 Non-pressure chronic ulcer of left calf with fat M61.462 Other calcification of muscle, left lower leg I73.9 Peripheral vascular disease, unspecified I89.0 Lymphedema, not  elsewhere classified Modifier: layer exposed Quantity: 1 Electronic Signature(s) Signed: 04/20/2016 1:38:27 PM By: Evlyn Kanner MD, FACS Entered By: Evlyn Kanner on 04/20/2016 13:38:27

## 2016-04-21 NOTE — Progress Notes (Signed)
Jaime, Zuniga ([id number]) Visit Report for 04/20/2016 Arrival Information Details Patient Name: Jaime Zuniga, Jaime Zuniga Date of Service: 04/20/2016 12:45 PM Medical Record Number: [id number] Patient Account Number: [id number] Date of Birth/Sex: 11-21-1950 (65 y.o. Male) Treating RN: Huel Coventry Primary Care Physician: Evelene Croon Other Clinician: Referring Physician: Evelene Croon Treating Physician/Extender: Rudene Re in Treatment: 9 Visit Information History Since Last Visit Added or deleted any medications: No Patient Arrived: Dan Humphreys Any new allergies or adverse reactions: No Arrival Time: 13:03 Had a fall or experienced change in No Accompanied By: caregiver activities of daily living that may affect Transfer Assistance: None risk of falls: Patient Identification Verified: Yes Signs or symptoms of abuse/neglect since last No Secondary Verification Process Yes visito Completed: Hospitalized since last visit: No Patient Requires Transmission-Based No Pain Present Now: No Precautions: Patient Has Alerts: No Electronic Signature(s) Signed: 04/20/2016 2:47:24 PM By: Elliot Gurney, RN, BSN, Kim RN, BSN Entered By: Elliot Gurney, RN, BSN, Kim on 04/20/2016 13:04:03 Jaime Zuniga ([id number]) -------------------------------------------------------------------------------- Clinic Level of Care Assessment Details Patient Name: Jaime Zuniga Date of Service: 04/20/2016 12:45 PM Medical Record Number: [id number] Patient Account Number: [id number] Date of Birth/Sex: May 20, 1951 (65 y.o. Male) Treating RN: Clover Mealy, RN, BSN, Rita Primary Care Physician: Evelene Croon Other Clinician: Referring Physician: Evelene Croon Treating Physician/Extender: Rudene Re in Treatment: 9 Clinic Level of Care Assessment Items TOOL 4 Quantity Score []  - Use when only an EandM is performed on FOLLOW-UP visit 0 ASSESSMENTS - Nursing Assessment / Reassessment X - Reassessment of  Co-morbidities (includes updates in patient status) 1 10 X - Reassessment of Adherence to Treatment Plan 1 5 ASSESSMENTS - Wound and Skin Assessment / Reassessment X - Simple Wound Assessment / Reassessment - one wound 1 5 []  - Complex Wound Assessment / Reassessment - multiple wounds 0 []  - Dermatologic / Skin Assessment (not related to wound area) 0 ASSESSMENTS - Focused Assessment []  - Circumferential Edema Measurements - multi extremities 0 []  - Nutritional Assessment / Counseling / Intervention 0 X - Lower Extremity Assessment (monofilament, tuning fork, pulses) 1 5 []  - Peripheral Arterial Disease Assessment (using hand held doppler) 0 ASSESSMENTS - Ostomy and/or Continence Assessment and Care []  - Incontinence Assessment and Management 0 []  - Ostomy Care Assessment and Management (repouching, etc.) 0 PROCESS - Coordination of Care X - Simple Patient / Family Education for ongoing care 1 15 []  - Complex (extensive) Patient / Family Education for ongoing care 0 X - Staff obtains Chiropractor, Records, Test Results / Process Orders 1 10 X - Staff telephones HHA, Nursing Homes / Clarify orders / etc 1 10 []  - Routine Transfer to another Facility (non-emergent condition) 0 Mosso, Carrson ([id number]) []  - Routine Hospital Admission (non-emergent condition) 0 []  - New Admissions / Manufacturing engineer / Ordering NPWT, Apligraf, etc. 0 []  - Emergency Hospital Admission (emergent condition) 0 []  - Simple Discharge Coordination 0 []  - Complex (extensive) Discharge Coordination 0 PROCESS - Special Needs []  - Pediatric / Minor Patient Management 0 []  - Isolation Patient Management 0 []  - Hearing / Language / Visual special needs 0 []  - Assessment of Community assistance (transportation, D/C planning, etc.) 0 []  - Additional assistance / Altered mentation 0 []  - Support Surface(s) Assessment (bed, cushion, seat, etc.) 0 INTERVENTIONS - Wound Cleansing / Measurement X - Simple Wound  Cleansing - one wound 1 5 []  - Complex Wound Cleansing - multiple wounds 0 X - Wound Imaging (photographs - any number of wounds) 1 5 []  - Wound Tracing (instead  of photographs) 0 X - Simple Wound Measurement - one wound 1 5 []  - Complex Wound Measurement - multiple wounds 0 INTERVENTIONS - Wound Dressings X - Small Wound Dressing one or multiple wounds 1 10 []  - Medium Wound Dressing one or multiple wounds 0 []  - Large Wound Dressing one or multiple wounds 0 []  - Application of Medications - topical 0 []  - Application of Medications - injection 0 INTERVENTIONS - Miscellaneous []  - External ear exam 0 Streiff, Kaya ([id number]) []  - Specimen Collection (cultures, biopsies, blood, body fluids, etc.) 0 []  - Specimen(s) / Culture(s) sent or taken to Lab for analysis 0 []  - Patient Transfer (multiple staff / Michiel Sites Lift / Similar devices) 0 []  - Simple Staple / Suture removal (25 or less) 0 []  - Complex Staple / Suture removal (26 or more) 0 []  - Hypo / Hyperglycemic Management (close monitor of Blood Glucose) 0 []  - Ankle / Brachial Index (ABI) - do not check if billed separately 0 X - Vital Signs 1 5 Has the patient been seen at the hospital within the last three years: Yes Total Score: 90 Level Of Care: New/Established - Level 3 Electronic Signature(s) Signed: 04/20/2016 5:04:09 PM By: Elpidio Eric BSN, RN Entered By: Elpidio Eric on 04/20/2016 13:31:46 Jaime Zuniga ([id number]) -------------------------------------------------------------------------------- Encounter Discharge Information Details Patient Name: Jaime Zuniga Date of Service: 04/20/2016 12:45 PM Medical Record Number: [id number] Patient Account Number: [id number] Date of Birth/Sex: [birth year] (65 y.o. Male) Treating RN: Huel Coventry Primary Care Physician: Evelene Croon Other Clinician: Referring Physician: Evelene Croon Treating Physician/Extender: Rudene Re in Treatment: 9 Encounter Discharge  Information Items Discharge Pain Level: 0 Discharge Condition: Stable Ambulatory Status: Walker Discharge Destination: Home Private Transportation: Auto Accompanied By: caregiver Schedule Follow-up Appointment: Yes Medication Reconciliation completed and Yes provided to Patient/Care Elven Laboy: Clinical Summary of Care: Electronic Signature(s) Signed: 04/20/2016 2:47:24 PM By: Elliot Gurney RN, BSN, Kim RN, BSN Previous Signature: 04/20/2016 1:35:16 PM Version By: Gwenlyn Perking Entered By: Elliot Gurney RN, BSN, Kim on 04/20/2016 13:35:29 Jaime Zuniga ([id number]) -------------------------------------------------------------------------------- Lower Extremity Assessment Details Patient Name: Jaime Zuniga Date of Service: 04/20/2016 12:45 PM Medical Record Number: [id number] Patient Account Number: [id number] Date of Birth/Sex: 01-20-1951 (65 y.o. Male) Treating RN: Huel Coventry Primary Care Physician: Evelene Croon Other Clinician: Referring Physician: Evelene Croon Treating Physician/Extender: Rudene Re in Treatment: 9 Vascular Assessment Pulses: Posterior Tibial Palpable: [Left:Yes] Dorsalis Pedis Palpable: [Left:Yes] Extremity colors, hair growth, and conditions: Extremity Color: [Left:Hyperpigmented] Hair Growth on Extremity: [Left:No] Temperature of Extremity: [Left:Warm] Capillary Refill: [Left:< 3 seconds] Dependent Rubor: [Left:No] Blanched when Elevated: [Left:No] Lipodermatosclerosis: [Left:No] Toe Nail Assessment Left: Right: Thick: No Discolored: No Deformed: No Improper Length and Hygiene: No Electronic Signature(s) Signed: 04/20/2016 2:47:24 PM By: Elliot Gurney, RN, BSN, Kim RN, BSN Entered By: Elliot Gurney, RN, BSN, Kim on 04/20/2016 13:09:13 Jaime Zuniga ([id number]) -------------------------------------------------------------------------------- Multi Wound Chart Details Patient Name: Jaime Zuniga Date of Service: 04/20/2016 12:45 PM Medical Record Number:  [id number] Patient Account Number: [id number] Date of Birth/Sex: [birth year] (65 y.o. Male) Treating RN: Clover Mealy, RN, BSN, Rita Primary Care Physician: Evelene Croon Other Clinician: Referring Physician: Evelene Croon Treating Physician/Extender: Rudene Re in Treatment: 9 Vital Signs Height(in): 72 Pulse(bpm): Weight(lbs): 210 Blood Pressure 106/76 (mmHg): Body Mass Index(BMI): 28 Temperature(F): 97.7 Respiratory Rate 16 (breaths/min): Photos: [N/A:N/A] Wound Location: Left Lower Leg - Anterior, N/A N/A Distal Wounding Event: Gradually Appeared N/A N/A Primary Etiology: Venous Leg Ulcer N/A N/A Comorbid History: Chronic Obstructive N/A N/A Pulmonary Disease (COPD), Hypertension, Myocardial  Infarction, Osteoarthritis, Dementia Date Acquired: 01/26/2016 N/A N/A Weeks of Treatment: 9 N/A N/A Wound Status: Open N/A N/A Measurements L x W x D 1.4x0.7x0.1 N/A N/A (cm) Area (cm) : 0.77 N/A N/A Volume (cm) : 0.077 N/A N/A % Reduction in Area: 34.60% N/A N/A % Reduction in Volume: 67.40% N/A N/A Classification: Full Thickness Without N/A N/A Exposed Support Structures Exudate Amount: Large N/A N/A Exudate Type: Serosanguineous N/A N/A Pagaduan, Sonu ([id number]) Exudate Color: red, brown N/A N/A Wound Margin: Distinct, outline attached N/A N/A Granulation Amount: Medium (34-66%) N/A N/A Granulation Quality: Pink N/A N/A Necrotic Amount: Medium (34-66%) N/A N/A Exposed Structures: Fascia: No N/A N/A Fat: No Tendon: No Muscle: No Joint: No Bone: No Epithelialization: Small (1-33%) N/A N/A Periwound Skin Texture: Induration: Yes N/A N/A Scarring: Yes Edema: No Excoriation: No Callus: No Crepitus: No Fluctuance: No Friable: No Rash: No Periwound Skin Moist: Yes N/A N/A Moisture: Maceration: No Dry/Scaly: No Periwound Skin Color: Atrophie Blanche: No N/A N/A Cyanosis: No Ecchymosis: No Erythema: No Hemosiderin Staining: No Mottled:  No Pallor: No Rubor: No Temperature: No Abnormality N/A N/A Tenderness on Yes N/A N/A Palpation: Wound Preparation: Ulcer Cleansing: N/A N/A Rinsed/Irrigated with Saline Topical Anesthetic Applied: Other: Lidocaine 4% Cream Treatment Notes Electronic Signature(s) Signed: 04/20/2016 5:04:09 PM By: Elpidio Eric BSN, RN Entered By: Elpidio Eric on 04/20/2016 13:27:18 AVYAY, COGER ([id number]) ARISTEO, HANKERSON ([id number]) -------------------------------------------------------------------------------- Multi-Disciplinary Care Plan Details Patient Name: Jaime Zuniga Date of Service: 04/20/2016 12:45 PM Medical Record Number: [id number] Patient Account Number: [id number] Date of Birth/Sex: April 26, 1951 (65 y.o. Male) Treating RN: Clover Mealy, RN, BSN, [place] Primary Care Physician: Evelene Croon Other Clinician: Referring Physician: Evelene Croon Treating Physician/Extender: Rudene Re in Treatment: 9 Active Inactive Abuse / Safety / Falls / Self Care Management Nursing Diagnoses: Potential for falls Goals: Patient will remain injury free Date Initiated: 02/11/2016 Goal Status: Active Interventions: Assess fall risk on admission and as needed Assess self care needs on admission and as needed Notes: Nutrition Nursing Diagnoses: Imbalanced nutrition Goals: Patient/caregiver agrees to and verbalizes understanding of need to use nutritional supplements and/or vitamins as prescribed Date Initiated: 02/11/2016 Goal Status: Active Interventions: Assess patient nutrition upon admission and as needed per policy Notes: Orientation to the Wound Care Program Nursing Diagnoses: Knowledge deficit related to the wound healing center program GoalsMATHIAS, BOGACKI ([id number]) Patient/caregiver will verbalize understanding of the Wound Healing Center Program Date Initiated: 02/11/2016 Goal Status: Active Interventions: Provide education on orientation to the wound  center Notes: Pain, Acute or Chronic Nursing Diagnoses: Pain, acute or chronic: actual or potential Potential alteration in comfort, pain Goals: Patient will verbalize adequate pain control and receive pain control interventions during procedures as needed Date Initiated: 02/11/2016 Goal Status: Active Patient/caregiver will verbalize adequate pain control between visits Date Initiated: 02/11/2016 Goal Status: Active Interventions: Assess comfort goal upon admission Complete pain assessment as per visit requirements Notes: Soft Tissue Infection Nursing Diagnoses: Impaired tissue integrity Goals: Patient/caregiver will verbalize understanding of or measures to prevent infection and contamination in the home setting Date Initiated: 02/11/2016 Goal Status: Active Patient's soft tissue infection will resolve Date Initiated: 02/11/2016 Goal Status: Active Interventions: Assess signs and symptoms of infection every visit JAC, ROMULUS ([id number]) Notes: Wound/Skin Impairment Nursing Diagnoses: Impaired tissue integrity Knowledge deficit related to smoking impact on wound healing Goals: Patient will demonstrate a reduced rate of smoking or cessation of smoking Date Initiated: 02/11/2016 Goal Status: Active Ulcer/skin breakdown will have a volume reduction of 30% by week 4  Date Initiated: 02/11/2016 Goal Status: Active Ulcer/skin breakdown will have a volume reduction of 50% by week 8 Date Initiated: 02/11/2016 Goal Status: Active Ulcer/skin breakdown will have a volume reduction of 80% by week 12 Date Initiated: 02/11/2016 Goal Status: Active Interventions: Assess ulceration(s) every visit Notes: Electronic Signature(s) Signed: 04/20/2016 5:04:09 PM By: Elpidio EricAfful, Rita BSN, RN Entered By: Elpidio EricAfful, Rita on 04/20/2016 13:26:18 Jaime MaclachlanBURTON, Taiwo ([id number]) -------------------------------------------------------------------------------- Pain Assessment Details Patient Name: Jaime MaclachlanBURTON, Dhani Date  of Service: 04/20/2016 12:45 PM Medical Record Number: [id number] Patient Account Number: [id number] Date of Birth/Sex: 1950/09/07 80(65 y.o. Male) Treating RN: Huel CoventryWoody, Kim Primary Care Physician: Evelene CroonNiemeyer, Meindert Other Clinician: Referring Physician: Evelene CroonNiemeyer, Meindert Treating Physician/Extender: Rudene ReBritto, Errol Weeks in Treatment: 9 Active Problems Location of Pain Severity and Description of Pain Patient Has Paino No Site Locations With Dressing Change: No Pain Management and Medication Current Pain Management: Electronic Signature(s) Signed: 04/20/2016 2:47:24 PM By: Elliot GurneyWoody, RN, BSN, Kim RN, BSN Entered By: Elliot GurneyWoody, RN, BSN, Kim on 04/20/2016 13:05:11 Jaime MaclachlanBURTON, Jahlen ([id number]) -------------------------------------------------------------------------------- Patient/Caregiver Education Details Patient Name: Jaime MaclachlanBURTON, Maleik Date of Service: 04/20/2016 12:45 PM Medical Record Number: [id number] Patient Account Number: [id number] Date of Birth/Gender: 1950/09/07 (65 y.o. Male) Treating RN: Huel CoventryWoody, Kim Primary Care Physician: Evelene CroonNiemeyer, Meindert Other Clinician: Referring Physician: Evelene CroonNiemeyer, Meindert Treating Physician/Extender: Rudene ReBritto, Errol Weeks in Treatment: 9 Education Assessment Education Provided To: Patient Education Topics Provided Wound/Skin Impairment: Handouts: Caring for Your Ulcer, Other: wound care as prescribed Methods: Demonstration, Explain/Verbal Responses: State content correctly Electronic Signature(s) Signed: 04/20/2016 2:47:24 PM By: Elliot GurneyWoody, RN, BSN, Kim RN, BSN Entered By: Elliot GurneyWoody, RN, BSN, Kim on 04/20/2016 13:35:49 Jaime MaclachlanBURTON, Toddrick ([id number]) -------------------------------------------------------------------------------- Wound Assessment Details Patient Name: Jaime MaclachlanBURTON, Korrey Date of Service: 04/20/2016 12:45 PM Medical Record Number: [id number] Patient Account Number: [id number] Date of Birth/Sex: 1950/09/07 (65 y.o. Male) Treating RN: Huel CoventryWoody, Kim Primary Care  Physician: Evelene CroonNiemeyer, Meindert Other Clinician: Referring Physician: Evelene CroonNiemeyer, Meindert Treating Physician/Extender: Rudene ReBritto, Errol Weeks in Treatment: 9 Wound Status Wound Number: 2 Primary Venous Leg Ulcer Etiology: Wound Location: Left Lower Leg - Anterior, Distal Wound Open Status: Wounding Event: Gradually Appeared Comorbid Chronic Obstructive Pulmonary Disease Date Acquired: 01/26/2016 History: (COPD), Hypertension, Myocardial Weeks Of Treatment: 9 Infarction, Osteoarthritis, Dementia Clustered Wound: No Photos Wound Measurements Length: (cm) 1.4 Width: (cm) 0.7 Depth: (cm) 0.1 Area: (cm) 0.77 Volume: (cm) 0.077 % Reduction in Area: 34.6% % Reduction in Volume: 67.4% Epithelialization: Small (1-33%) Tunneling: No Undermining: No Wound Description Full Thickness Without Exposed Classification: Support Structures Wound Margin: Distinct, outline attached Exudate Large Amount: Exudate Type: Serosanguineous Exudate Color: red, brown Foul Odor After Cleansing: No Wound Bed Granulation Amount: Medium (34-66%) Exposed Structure Granulation Quality: Pink Fascia Exposed: No Necrotic Amount: Medium (34-66%) Fat Layer Exposed: No Schoof, Knowledge ([id number]) Necrotic Quality: Adherent Slough Tendon Exposed: No Muscle Exposed: No Joint Exposed: No Bone Exposed: No Periwound Skin Texture Texture Color No Abnormalities Noted: No No Abnormalities Noted: No Callus: No Atrophie Blanche: No Crepitus: No Cyanosis: No Excoriation: No Ecchymosis: No Fluctuance: No Erythema: No Friable: No Hemosiderin Staining: No Induration: Yes Mottled: No Localized Edema: No Pallor: No Rash: No Rubor: No Scarring: Yes Temperature / Pain Moisture Temperature: No Abnormality No Abnormalities Noted: No Tenderness on Palpation: Yes Dry / Scaly: No Maceration: No Moist: Yes Wound Preparation Ulcer Cleansing: Rinsed/Irrigated with Saline Topical Anesthetic Applied: Other:  Lidocaine 4% Cream, Treatment Notes Wound #2 (Left, Distal, Anterior Lower Leg) 1. Cleansed with: Clean wound with Normal Saline 2. Anesthetic Topical Lidocaine 4% cream to wound bed prior to  debridement 4. Dressing Applied: Prisma Ag 5. Secondary Dressing Applied [employer]) Signed: 04/20/2016 2:47:24 PM By: Elliot Gurney, RN, BSN, Kim RN, BSN Entered By: Elliot Gurney, RN, BSN, Kim on 04/20/2016 13:11:29 Jaime Zuniga ([id number]) -------------------------------------------------------------------------------- Vitals Details Patient Name: Jaime Zuniga Date of Service: 04/20/2016 12:45 PM Medical Record Number: [id number] Patient Account Number: [id number] Date of Birth/Sex: 11-15-1950 (65 y.o. Male) Treating RN: Huel Coventry Primary Care Physician: Evelene Croon Other Clinician: Referring Physician: Evelene Croon Treating Physician/Extender: Rudene Re in Treatment: 9 Vital Signs Time Taken: 13:05 Temperature (F): 97.7 Height (in): 72 Respiratory Rate (breaths/min): 16 Weight (lbs): 210 Blood Pressure (mmHg): 106/76 Body Mass Index (BMI): 28.5 Reference Range: 80 - 120 mg / dl Electronic Signature(s) Signed: 04/20/2016 2:47:24 PM By: Elliot Gurney, RN, BSN, Kim RN, BSN Entered By: Elliot Gurney, RN, BSN, Kim on 04/20/2016 13:05:32

## 2016-04-24 ENCOUNTER — Ambulatory Visit: Payer: Medicare Other | Admitting: Surgery

## 2016-04-27 ENCOUNTER — Ambulatory Visit: Payer: Medicare Other | Admitting: Surgery

## 2016-05-04 ENCOUNTER — Encounter: Payer: Medicare Other | Admitting: Surgery

## 2016-05-04 DIAGNOSIS — L97222 Non-pressure chronic ulcer of left calf with fat layer exposed: Secondary | ICD-10-CM | POA: Diagnosis not present

## 2016-05-05 NOTE — Progress Notes (Signed)
[name], [name] ([id number]) Visit Report for 05/04/2016 Arrival Information Details Patient Name: Jaime, Zuniga Date of Service: 05/04/2016 1:30 PM Medical Record Number: [id number] Patient Account Number: [id number] Date of Birth/Sex: 1951-03-24 (65 y.o. Male) Treating RN: Curtis Sites Primary Care Physician: Evelene Croon Other Clinician: Referring Physician: Evelene Croon Treating Physician/Extender: Rudene Re in Treatment: 11 Visit Information History Since Last Visit Added or deleted any medications: No Patient Arrived: Jaime Zuniga Any new allergies or adverse reactions: No Arrival Time: 13:46 Had a fall or experienced change in No Accompanied By: staff activities of daily living that may affect Transfer Assistance: None risk of falls: Patient Identification Verified: Yes Signs or symptoms of abuse/neglect since last No Secondary Verification Process Completed: Yes visito Patient Requires Transmission-Based No Hospitalized since last visit: No Precautions: Pain Present Now: No Patient Has Alerts: No Electronic Signature(s) Signed: 05/04/2016 4:40:32 PM By: Curtis Sites Entered By: Curtis Sites on 05/04/2016 13:46:46 Jaime Zuniga ([id number]) -------------------------------------------------------------------------------- Clinic Level of Care Assessment Details Patient Name: Jaime Zuniga Date of Service: 05/04/2016 1:30 PM Medical Record Number: [id number] Patient Account Number: [id number] Date of Birth/Sex: 1950/12/18 (65 y.o. Male) Treating RN: Clover Mealy, RN, BSN, Rita Primary Care Physician: Evelene Croon Other Clinician: Referring Physician: Evelene Croon Treating Physician/Extender: Rudene Re in Treatment: 11 Clinic Level of Care Assessment Items TOOL 4 Quantity Score []  - Use when only an EandM is performed on FOLLOW-UP visit 0 ASSESSMENTS - Nursing Assessment / Reassessment X - Reassessment of Co-morbidities (includes updates  in patient status) 1 10 X - Reassessment of Adherence to Treatment Plan 1 5 ASSESSMENTS - Wound and Skin Assessment / Reassessment X - Simple Wound Assessment / Reassessment - one wound 1 5 []  - Complex Wound Assessment / Reassessment - multiple wounds 0 []  - Dermatologic / Skin Assessment (not related to wound area) 0 ASSESSMENTS - Focused Assessment []  - Circumferential Edema Measurements - multi extremities 0 []  - Nutritional Assessment / Counseling / Intervention 0 X - Lower Extremity Assessment (monofilament, tuning fork, pulses) 1 5 []  - Peripheral Arterial Disease Assessment (using hand held doppler) 0 ASSESSMENTS - Ostomy and/or Continence Assessment and Care []  - Incontinence Assessment and Management 0 []  - Ostomy Care Assessment and Management (repouching, etc.) 0 PROCESS - Coordination of Care X - Simple Patient / Family Education for ongoing care 1 15 []  - Complex (extensive) Patient / Family Education for ongoing care 0 []  - Staff obtains Chiropractor, Records, Test Results / Process Orders 0 []  - Staff telephones HHA, Nursing Homes / Clarify orders / etc 0 []  - Routine Transfer to another Facility (non-emergent condition) 0 Jaime Zuniga, Jaime Zuniga ([id number]) []  - Routine Hospital Admission (non-emergent condition) 0 []  - New Admissions / Manufacturing engineer / Ordering NPWT, Apligraf, etc. 0 []  - Emergency Hospital Admission (emergent condition) 0 []  - Simple Discharge Coordination 0 []  - Complex (extensive) Discharge Coordination 0 PROCESS - Special Needs []  - Pediatric / Minor Patient Management 0 []  - Isolation Patient Management 0 []  - Hearing / Language / Visual special needs 0 []  - Assessment of Community assistance (transportation, D/C planning, etc.) 0 []  - Additional assistance / Altered mentation 0 []  - Support Surface(s) Assessment (bed, cushion, seat, etc.) 0 INTERVENTIONS - Wound Cleansing / Measurement X - Simple Wound Cleansing - one wound 1 5 []  - Complex  Wound Cleansing - multiple wounds 0 X - Wound Imaging (photographs - any number of wounds) 1 5 []  - Wound Tracing (instead of photographs) 0 X - Simple Wound Measurement -  one wound 1 5 []  - Complex Wound Measurement - multiple wounds 0 INTERVENTIONS - Wound Dressings X - Small Wound Dressing one or multiple wounds 1 10 []  - Medium Wound Dressing one or multiple wounds 0 []  - Large Wound Dressing one or multiple wounds 0 []  - Application of Medications - topical 0 []  - Application of Medications - injection 0 INTERVENTIONS - Miscellaneous []  - External ear exam 0 Jaime Zuniga, Jaime Zuniga ([id number]) []  - Specimen Collection (cultures, biopsies, blood, body fluids, etc.) 0 []  - Specimen(s) / Culture(s) sent or taken to Lab for analysis 0 []  - Patient Transfer (multiple staff / Michiel Sites Lift / Similar devices) 0 []  - Simple Staple / Suture removal (25 or less) 0 []  - Complex Staple / Suture removal (26 or more) 0 []  - Hypo / Hyperglycemic Management (close monitor of Blood Glucose) 0 []  - Ankle / Brachial Index (ABI) - do not check if billed separately 0 X - Vital Signs 1 5 Has the patient been seen at the hospital within the last three years: Yes Total Score: 70 Level Of Care: New/Established - Level 2 Electronic Signature(s) Signed: 05/04/2016 5:32:18 PM By: Elpidio Eric BSN, RN Entered By: Elpidio Eric on 05/04/2016 14:15:31 Jaime Zuniga ([id number]) -------------------------------------------------------------------------------- Encounter Discharge Information Details Patient Name: Jaime Zuniga Date of Service: 05/04/2016 1:30 PM Medical Record Number: [id number] Patient Account Number: [id number] Date of Birth/Sex: 03-31-1951 (65 y.o. Male) Treating RN: Curtis Sites Primary Care Physician: Evelene Croon Other Clinician: Referring Physician: Evelene Croon Treating Physician/Extender: Rudene Re in Treatment: 11 Encounter Discharge Information Items Discharge Pain Level:  0 Discharge Condition: Stable Ambulatory Status: Walker Discharge Destination: Home Transportation: Private Auto Accompanied By: staff Schedule Follow-up Appointment: Yes Medication Reconciliation completed No and provided to Patient/Care Clairissa Valvano: Provided on Clinical Summary of Care: 05/04/2016 Form Type Recipient Paper Patient SB Electronic Signature(s) Signed: 05/04/2016 3:38:34 PM By: Curtis Sites Previous Signature: 05/04/2016 2:22:26 PM Version By: Gwenlyn Perking Entered By: Curtis Sites on 05/04/2016 15:38:34 Jaime Zuniga ([id number]) -------------------------------------------------------------------------------- Lower Extremity Assessment Details Patient Name: Jaime Zuniga Date of Service: 05/04/2016 1:30 PM Medical Record Number: [id number] Patient Account Number: [id number] Date of Birth/Sex: 08-12-1950 (65 y.o. Male) Treating RN: Curtis Sites Primary Care Physician: Evelene Croon Other Clinician: Referring Physician: Evelene Croon Treating Physician/Extender: Rudene Re in Treatment: 11 Vascular Assessment Pulses: Posterior Tibial Dorsalis Pedis Palpable: [Left:Yes] Extremity colors, hair growth, and conditions: Extremity Color: [Left:Hyperpigmented] Hair Growth on Extremity: [Left:No] Temperature of Extremity: [Left:Warm] Capillary Refill: [Left:< 3 seconds] Electronic Signature(s) Signed: 05/04/2016 4:40:32 PM By: Curtis Sites Entered By: Curtis Sites on 05/04/2016 13:51:59 Jaime Zuniga ([id number]) -------------------------------------------------------------------------------- Multi Wound Chart Details Patient Name: Jaime Zuniga Date of Service: 05/04/2016 1:30 PM Medical Record Number: [id number] Patient Account Number: [id number] Date of Birth/Sex: [birth year] (65 y.o. 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] [N/A:N/A] Date Acquired: [2:01/26/2016] [N/A:N/A] Weeks of Treatment: [2:11] [N/A:N/A] Wound Status: [2:Open] [N/A:N/A] Measurements L x W x D 0.9x0.5x0.1 [N/A:N/A] (cm) Area (cm) : [2:0.353] [N/A:N/A] Volume (cm) : [2:0.035] [N/A:N/A] % Reduction in Area: [2:70.00%] [N/A:N/A] % Reduction in Volume: 85.20% [N/A:N/A] Classification: [2:Full Thickness Without Exposed Support Structures] [  N/A:N/A] Periwound Skin Texture: No Abnormalities Noted N/A Periwound Skin [2:No Abnormalities Noted N/A] Moisture: Periwound Skin Color: No Abnormalities Noted N/A Tenderness on [2:No] [N/A:N/A] Treatment Notes Electronic Signature(s) Signed: 05/04/2016 5:32:18 PM By: Elpidio EricAfful, Rita BSN, RN [place], Jaime Zuniga ([id number]) Entered By: Elpidio EricAfful, Rita on 05/04/2016 14:13:47 Jaime MaclachlanBURTON, Jaime Zuniga ([id number]) -------------------------------------------------------------------------------- Multi-Disciplinary Care Plan Details Patient Name: Jaime MaclachlanBURTON, Jaime Zuniga Date of Service: 05/04/2016 1:30 PM Medical Record Number: [id number] Patient Account Number: [id number] Date of Birth/Sex: [birth year](65 y.o. Male) Treating RN: Clover MealyAfful, RN, BSN, [place] Primary Care Physician: Evelene CroonNiemeyer, Meindert Other Clinician: Referring Physician: Evelene CroonNiemeyer, Meindert Treating Physician/Extender: Rudene ReBritto, Errol Weeks in Treatment: 11 Active Inactive Abuse / Safety / Falls / Self Care Management Nursing Diagnoses: Potential for falls Goals: Patient will remain injury free Date Initiated: 02/11/2016 Goal Status: Active Interventions: Assess fall  risk on admission and as needed Assess self care needs on admission and as needed Notes: Nutrition Nursing Diagnoses: Imbalanced nutrition Goals: Patient/caregiver agrees to and verbalizes understanding of need to use nutritional supplements and/or vitamins as prescribed Date Initiated: 02/11/2016 Goal Status: Active Interventions: Assess patient nutrition upon admission and as needed per policy Notes: Orientation to the Wound Care Program Nursing Diagnoses: Knowledge deficit related to the wound healing center program GoalsNada Zuniga: Jaime Zuniga, Jaime Zuniga ([id number]) Patient/caregiver will verbalize understanding of the Wound Healing Center Program Date Initiated: 02/11/2016 Goal Status: Active Interventions: Provide education on orientation to the wound center Notes: Pain, Acute or Chronic Nursing Diagnoses: Pain, acute or chronic: actual or potential Potential alteration in comfort, pain Goals: Patient will verbalize adequate pain control and receive pain control interventions during procedures as needed Date Initiated: 02/11/2016 Goal Status: Active Patient/caregiver will verbalize adequate pain control between visits Date Initiated: 02/11/2016 Goal Status: Active Interventions: Assess comfort goal upon admission Complete pain assessment as per visit requirements Notes: Soft Tissue Infection Nursing Diagnoses: Impaired tissue integrity Goals: Patient/caregiver will verbalize understanding of or measures to prevent infection and contamination in the home setting Date Initiated: 02/11/2016 Goal Status: Active Patient's soft tissue infection will resolve Date Initiated: 02/11/2016 Goal Status: Active Interventions: Assess signs and symptoms of infection every visit Jaime MaclachlanBURTON, Jaime Zuniga ([id number]) Notes: Wound/Skin Impairment Nursing Diagnoses: Impaired tissue integrity Knowledge deficit related to smoking impact on wound healing Goals: Patient will demonstrate a reduced rate of smoking or  cessation of smoking Date Initiated: 02/11/2016 Goal Status: Active Ulcer/skin breakdown will have a volume reduction of 30% by week 4 Date Initiated: 02/11/2016 Goal Status: Active Ulcer/skin breakdown will have a volume reduction of 50% by week 8 Date Initiated: 02/11/2016 Goal Status: Active Ulcer/skin breakdown will have a volume reduction of 80% by week 12 Date Initiated: 02/11/2016 Goal Status: Active Interventions: Assess ulceration(s) every visit Notes: Electronic Signature(s) Signed: 05/04/2016 5:32:18 PM By: Elpidio EricAfful, Rita BSN, RN Entered By: Elpidio EricAfful, Rita on 05/04/2016 14:13:16 Jaime MaclachlanBURTON, Kenith ([id number]) -------------------------------------------------------------------------------- Pain Assessment Details Patient Name: Jaime MaclachlanBURTON, Jaime Zuniga Date of Service: 05/04/2016 1:30 PM Medical Record Number: [id number] Patient Account Number: [id number] Date of Birth/Sex: [birth year](65 y.o. Male) Treating RN: Curtis Sitesorthy, Joanna Primary Care Physician: Evelene CroonNiemeyer, Meindert Other Clinician: Referring Physician: Evelene CroonNiemeyer, Meindert Treating Physician/Extender: Rudene ReBritto, Errol Weeks in Treatment: 11 Active Problems Location of Pain Severity and Description of Pain Patient Has Paino No Site Locations Pain Management and Medication Current Pain Management: Notes Topical or injectable lidocaine is offered to patient for acute pain when surgical debridement is performed. If needed, Patient is instructed to use over the counter pain medication for the following 24-48 hours after debridement. Wound care MDs do not prescribed  pain medications. Patient has chronic pain or uncontrolled pain. Patient has been instructed to make an appointment with their Primary Care Physician for pain management. Electronic Signature(s) Signed: 05/04/2016 4:40:32 PM By: Curtis Sites Entered By: Curtis Sites on 05/04/2016 13:46:55 Jaime Zuniga  ([id number]) -------------------------------------------------------------------------------- Patient/Caregiver Education Details Patient Name: Jaime Zuniga Date of Service: 05/04/2016 1:30 PM Medical Record Number: [id number] Patient Account Number: [id number] Date of Birth/Gender: [birth year] (65 y.o. Male) Treating RN: Curtis Sites Primary Care Physician: Evelene Croon Other Clinician: Referring Physician: Evelene Croon Treating Physician/Extender: Rudene Re in Treatment: 11 Education Assessment Education Provided To: Caregiver Education Topics Provided Wound/Skin Impairment: Handouts: Other: wound care as ordered Methods: Explain/Verbal Responses: State content correctly Electronic Signature(s) Signed: 05/04/2016 4:40:32 PM By: Curtis Sites Entered By: Curtis Sites on 05/04/2016 15:38:49 Jaime Zuniga ([id number]) -------------------------------------------------------------------------------- Wound Assessment Details Patient Name: Jaime Zuniga Date of Service: 05/04/2016 1:30 PM Medical Record Number: [id number] Patient Account Number: [id number] Date of Birth/Sex: 1951-07-07 (65 y.o. Male) Treating RN: Curtis Sites Primary Care Physician: Evelene Croon Other Clinician: Referring Physician: Evelene Croon Treating Physician/Extender: Rudene Re in Treatment: 11 Wound Status Wound Number: 2 Primary Venous Leg Ulcer Etiology: Wound Location: Left Lower Leg - Anterior, Distal Wound Open Status: Wounding Event: Gradually Appeared Comorbid Chronic Obstructive Pulmonary Disease Date Acquired: 01/26/2016 History: (COPD), Hypertension, Myocardial Weeks Of Treatment: 11 Infarction, Osteoarthritis, Dementia Clustered Wound: No Photos Wound Measurements Length: (cm) 0.9 Width: (cm) 0.5 Depth: (cm) 0.1 Area: (cm) 0.353 Volume: (cm) 0.035 % Reduction in Area: 70% % Reduction in Volume: 85.2% Epithelialization: Small  (1-33%) Wound Description Full Thickness Without Exposed Classification: Support Structures Wound Margin: Distinct, outline attached Exudate Large Amount: Exudate Type: Serosanguineous Exudate Color: red, brown Foul Odor After Cleansing: No Wound Bed Granulation Amount: Medium (34-66%) Exposed Structure Granulation Quality: Pink Fascia Exposed: No Necrotic Amount: Medium (34-66%) Fat Layer Exposed: No Jaime Zuniga, Jaime Zuniga ([id number]) Necrotic Quality: Adherent Slough Tendon Exposed: No Muscle Exposed: No Joint Exposed: No Bone Exposed: No Periwound Skin Texture Texture Color No Abnormalities Noted: No No Abnormalities Noted: No Callus: No Atrophie Blanche: No Crepitus: No Cyanosis: No Excoriation: No Ecchymosis: No Fluctuance: No Erythema: No Friable: No Hemosiderin Staining: No Induration: Yes Mottled: No Localized Edema: No Pallor: No Rash: No Rubor: No Scarring: Yes Temperature / Pain Moisture Temperature: No Abnormality No Abnormalities Noted: No Tenderness on Palpation: Yes Dry / Scaly: No Maceration: No Moist: Yes Wound Preparation Ulcer Cleansing: Rinsed/Irrigated with Saline Topical Anesthetic Applied: Other: Lidocaine 4% Cream, Treatment Notes Wound #2 (Left, Distal, Anterior Lower Leg) 1. Cleansed with: Cleanse wound with antibacterial soap and water 2. Anesthetic Topical Lidocaine 4% cream to wound bed prior to debridement 4. Dressing Applied: Prisma Ag 5. Secondary Dressing Applied Kerlix/Conform Non-Adherent pad 7. Secured with Tape Notes netting Electronic Signature(s) Signed: 05/04/2016 4:40:32 PM By: Curtis Sites Entered By: Curtis Sites on 05/04/2016 14:32:58 Jaime Zuniga, Jaime Zuniga ([id number]) Jaime Zuniga, Jaime Zuniga ([id number]) -------------------------------------------------------------------------------- Vitals Details Patient Name: Jaime Zuniga Date of Service: 05/04/2016 1:30 PM Medical Record Number: [id number] Patient Account Number:  [id number] Date of Birth/Sex: 02/20/1951 (65 y.o. Male) Treating RN: Curtis Sites Primary Care Physician: Evelene Croon Other Clinician: Referring Physician: Evelene Croon Treating Physician/Extender: Rudene Re in Treatment: 11 Vital Signs Time Taken: 13:46 Temperature (F): 97.9 Height (in): 72 Pulse (bpm): 73 Weight (lbs): 210 Respiratory Rate (breaths/min): 18 Body Mass Index (BMI): 28.5 Blood Pressure (mmHg): 125/55 Reference Range: 80 - 120 mg / dl Electronic Signature(s) Signed: 05/04/2016 4:40:32 PM By: Curtis Sites Entered By: Francesco Sor,  Joanna on 05/04/2016 13:48:57

## 2016-05-05 NOTE — Progress Notes (Signed)
[place], [place] ([id number]) Visit Report for 05/04/2016 Chief Complaint Document Details Patient Name: Jaime Zuniga, Jaime Zuniga Date of Service: 05/04/2016 1:30 PM Medical Record Number: [id number] Patient Account Number: [id number] Date of Birth/Sex: [birth year] (65 y.o. 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 Male) Treating RN: Curtis Sites Primary Care Physician: Evelene Croon Other Clinician: Referring Physician: Evelene Croon Treating Physician/Extender: Rudene Re in Treatment: 11 History of Present Illness Location: left lower extremity anterior shin area Quality: Patient reports experiencing a dull pain to affected area(s). Severity: Patient states wound are getting worse. Duration: Patient has had the wound for > 3 months prior to seeking treatment at the wound center Timing: Pain in wound is Intermittent (comes and goes Context: The wound appeared gradually over time Modifying Factors: Other treatment(s) tried include:Augmentin and doxycycline Associated Signs and Symptoms: Patient reports having increase swelling. HPI Description: 65 year old gentleman was being seen in the ER recently  for wounds on his left lower extremity which have been draining and there is swelling and he was concerned about wound infection. in the ER x-ray of the left leg was done which showed postsurgical changes but no acute findings.he wrapped his leg in and Unna's boots and referred him to the wound center. earlier in June he had a DVT study done of the left lower extremity which showed no evidence of DVT but there was edema seen. no reflux was seen on that study. in June he was seen by a surgeon Dr. Lemar Livings who advised symptomatic treatment. the patient was also seen at the [facility] he has recently completed courses of Augmentin and doxycycline. 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 02/24/2016 -- seen by Dr. Kirke Corin on 02/17/2016. Lower extremity arterial studies were done which showed a normal ABI bilaterally and normal great toe pressures. Duplex showed diffuse nonobstructive disease and a three-vessel runoff bilaterally. No further vascular studies or intervention was recommended. He was seen by Dr. Romilda Joy on 02/17/2016 and recommended to continue with wound care and no vascular studies intervention was recommended. Electronic Signature(s) Signed: 05/04/2016 2:27:40 PM By: Evlyn Kanner MD, FACS Entered By: Evlyn Kanner on 05/04/2016 14:27:40 Jaime Zuniga ([id number]) -------------------------------------------------------------------------------- Physical Exam Details Patient Name: Jaime Zuniga Date of Service: 05/04/2016 1:30 PM Medical Record Number: [id number] Patient Account Number: [id number] Date of Birth/Sex: 1951/04/26 (65 y.o. Male) Treating RN: Curtis Sites Primary Care Physician: Evelene Croon Other Clinician: Referring Physician: Evelene Croon Treating Physician/Extender: Rudene Re in Treatment: 11 Constitutional . Pulse  regular. Respirations normal and unlabored. Afebrile. . Eyes Nonicteric. Reactive to light. Ears, Nose, Mouth, and Throat Lips, teeth, and gums WNL.Marland Kitchen Moist mucosa without lesions. Neck supple and nontender. No palpable supraclavicular or cervical adenopathy. Normal sized without goiter. Respiratory WNL. No retractions.. Breath sounds WNL, No rubs, rales, rhonchi, or wheeze.. Cardiovascular  Heart rhythm and rate regular, no murmur or gallop.. Pedal Pulses WNL. No clubbing, cyanosis or edema. Lymphatic No adneopathy. No adenopathy. No adenopathy. Musculoskeletal Adexa without tenderness or enlargement.. Digits and nails w/o clubbing, cyanosis, infection, petechiae, ischemia, or inflammatory conditions.. Integumentary (Hair, Skin) No suspicious lesions. No crepitus or fluctuance. No peri-wound warmth or erythema. No masses.Marland Kitchen Psychiatric Judgement and insight Intact.. No evidence of depression, anxiety, or agitation.. Notes the wrap was not applied properly and hence his lymphedema is still persistent. The wound itself did not require sharp debridement today Electronic Signature(s) Signed: 05/04/2016 2:28:51 PM By: Evlyn Kanner MD, FACS Entered By: Evlyn Kanner on 05/04/2016 14:28:50 Jaime Zuniga ([id number]) -------------------------------------------------------------------------------- Physician Orders Details Patient Name: Jaime Zuniga Date of Service: 05/04/2016 1:30 PM Medical Record Number: [id number] Patient Account Number: [id number] Date of Birth/Sex: 1951/04/03 (65 y.o. Male) Treating RN: Clover Mealy, RN, BSN, Fritch Sink Primary Care Physician: Evelene Croon Other Clinician: Referring Physician: Evelene Croon Treating Physician/Extender: Rudene Re in Treatment: 1 Verbal / Phone Orders: Yes Clinician: Afful, RN, BSN, Rita Read Back and Verified: Yes Diagnosis Coding Wound Cleansing Wound #2 Left,Distal,Anterior Lower Leg o Clean wound with Normal  Saline. o Cleanse wound with mild soap and water o May Shower, gently pat wound dry prior to applying new dressing. Anesthetic Wound #2 Left,Distal,Anterior Lower Leg o Topical Lidocaine 4% cream applied to wound bed prior to debridement - for clinic use Skin Barriers/Peri-Wound Care Wound #2 Left,Distal,Anterior Lower Leg o Barrier cream Primary Wound Dressing Wound #2 Left,Distal,Anterior Lower Leg o Prisma Ag - please moisten with saline Secondary Dressing Wound #2 Left,Distal,Anterior Lower Leg o Gauze and Kerlix/Conform Dressing Change Frequency Wound #2 Left,Distal,Anterior Lower Leg o Change dressing every other day. Follow-up Appointments Wound #2 Left,Distal,Anterior Lower Leg o Return Appointment in 1 week. Edema Control Wound #2 Left,Distal,Anterior Lower Leg o Patient to wear own compression stockings o Elevate legs to the level of the heart and pump ankles as often as possible Jaime Zuniga, Jaime Zuniga ([id number]) o Support Garment 20-30 mm/Hg pressure to: - HOME HEALTH TO PLEASE ORDER STOCKINGS FOR PATIENT. Additional Orders / Instructions Wound #2 Left,Distal,Anterior Lower Leg o Stop Smoking o Increase protein intake. Home Health Wound #2 Left,Distal,Anterior Lower Leg o Continue Home Health Visits - [facility] o Home Health Nurse may visit PRN to address patientos wound care needs. - HOME HEALTH TO PLEASE ORDER COMPRESSION STOCKINGS o FACE TO FACE ENCOUNTER: MEDICARE and MEDICAID PATIENTS: I certify that this patient is under my care and that I had a face-to-face encounter that meets the physician face-to-face encounter requirements with this patient on this date. The encounter with the patient was in whole or in part for the following MEDICAL CONDITION: (primary reason for Home Healthcare) MEDICAL NECESSITY: I certify, that based on my findings, NURSING services are a medically necessary home health service. HOME BOUND STATUS: I certify  that my clinical findings support that this patient is homebound (i.e., Due to illness or injury, pt requires aid of supportive devices such as crutches, cane, wheelchairs, walkers, the use of special transportation or the assistance of another person to leave their place of residence. There is a normal inability to leave the home and doing so requires considerable and taxing effort. Other absences are for medical reasons / religious services and are infrequent or of short duration when for other reasons). o If current dressing causes regression in wound condition, may D/C ordered dressing product/s and apply Normal Saline Moist Dressing daily until next Wound Healing Center / Other  MD appointment. Notify Wound Healing Center of regression in wound condition at [phone]. o Please direct any NON-WOUND related issues/requests for orders to patient's Primary Care Physician Electronic Signature(s) Signed: 05/04/2016 4:25:16 PM By: Evlyn Kanner MD, FACS Signed: 05/04/2016 5:32:18 PM By: Elpidio Eric BSN, RN Entered By: Elpidio Eric on 05/04/2016 14:14:50 Jaime Zuniga ([id number]) -------------------------------------------------------------------------------- Problem List Details Patient Name: Jaime Zuniga Date of Service: 05/04/2016 1:30 PM Medical Record Number: [id number] Patient Account Number: [id number] Date of Birth/Sex: 1951-03-22 (65 y.o. 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 M61.462 Other calcification of muscle, left lower leg 02/11/2016 Yes I73.9 Peripheral vascular disease, unspecified 02/11/2016 Yes I89.0 Lymphedema, not elsewhere classified 04/20/2016  Yes Inactive Problems Resolved Problems Electronic Signature(s) Signed: 05/04/2016 2:27:26 PM By: Evlyn Kanner MD, FACS Entered By: Evlyn Kanner on 05/04/2016 14:27:26 Jaime Zuniga ([id number]) -------------------------------------------------------------------------------- Progress Note Details Patient Name: Jaime Zuniga Date of Service: 05/04/2016 1:30 PM Medical Record Number: [id number] Patient Account Number: [id number] Date of Birth/Sex: 1951-03-01 (65 y.o. 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 Severity: Patient states wound are getting worse. Duration: Patient has had the wound for > 3 months prior to seeking treatment at the wound center Timing: Pain in wound is Intermittent (comes and goes Context: The wound appeared gradually over time Modifying Factors: Other treatment(s) tried include:Augmentin and doxycycline Associated Signs and Symptoms: Patient reports having increase swelling. 65 year old gentleman was being seen in the ER recently for wounds on his left lower extremity which have been draining and there is swelling and he was concerned about wound infection. in the ER x-ray of the left leg was done which showed postsurgical changes but no acute findings.he wrapped his leg in and Unna's boots and referred him to the wound center. earlier in June he had a DVT study  done of the left lower extremity which showed no evidence of DVT but there was edema seen. no reflux was seen on that study. in June he was seen by a surgeon Dr. Lemar Livings who advised symptomatic treatment. the patient was also seen at the [facility] he has recently completed courses of Augmentin and doxycycline. 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 02/24/2016 -- seen by Dr. Kirke Corin on 02/17/2016. Lower extremity arterial studies were done which showed a normal ABI bilaterally and normal great toe pressures. Duplex showed diffuse nonobstructive disease and a three-vessel runoff bilaterally. No further vascular studies or intervention was recommended. He was seen by Dr. Romilda Joy on 02/17/2016 and recommended to continue with wound care and no vascular studies intervention was recommended. Jaime Zuniga, Jaime Zuniga ([id number]) Objective Constitutional Pulse regular. Respirations normal and unlabored. Afebrile. Vitals Time Taken: 1:46 PM, Height: 72  in, Weight: 210 lbs, BMI: 28.5, Temperature: 97.9 F, Pulse: 73 bpm, Respiratory Rate: 18 breaths/min, Blood Pressure: 125/55 mmHg. Eyes Nonicteric. Reactive to light. Ears, Nose, Mouth, and Throat Lips, teeth, and gums WNL.Marland Kitchen Moist mucosa without lesions. Neck supple and nontender. No palpable supraclavicular or cervical adenopathy. Normal sized without goiter. Respiratory WNL. No retractions.. Breath sounds WNL, No rubs, rales, rhonchi, or wheeze.. Cardiovascular Heart rhythm and rate regular, no murmur or gallop.. Pedal Pulses WNL. No clubbing, cyanosis or edema. Lymphatic No adneopathy. No adenopathy. No adenopathy. Musculoskeletal Adexa without tenderness or enlargement.. Digits and nails w/o clubbing, cyanosis, infection, petechiae, ischemia, or inflammatory conditions.Marland Kitchen Psychiatric Judgement and insight Intact.. No  evidence of depression, anxiety, or agitation.. General Notes: the wrap was not applied properly and hence his lymphedema is still persistent. The wound itself did not require sharp debridement today Integumentary (Hair, Skin) No suspicious lesions. No crepitus or fluctuance. No peri-wound warmth or erythema. No masses.. Wound #2 status is Open. Original cause of wound was Gradually Appeared. The wound is located on the [facility] Lower Leg. The wound measures 0.9cm length x 0.5cm width x 0.1cm depth; 0.353cm^2 area and 0.035cm^3 volume. There is a large amount of serosanguineous drainage noted. The wound margin is distinct with the outline attached to the wound base. There is medium (34-66%) pink granulation within the wound bed. There is a medium (34-66%) amount of necrotic tissue within the wound bed including Adherent Slough. The periwound skin appearance exhibited: Induration, Scarring, Moist. The periwound skin appearance did not exhibit: Callus, Crepitus, Excoriation, Fluctuance, Friable, Localized Edema, Rash, Dry/Scaly, Maceration, Atrophie Blanche, Cyanosis, Ecchymosis, Hemosiderin Staining, Behe, Jaime Zuniga ([id number]) Mottled, Pallor, Rubor, Erythema. Periwound temperature was noted as No Abnormality. The periwound has tenderness on palpation. Assessment Active Problems ICD-10 L97.222 - Non-pressure chronic ulcer of left calf with fat layer exposed M61.462 - Other calcification of muscle, left lower leg I73.9 - Peripheral vascular disease, unspecified I89.0 - Lymphedema, not elsewhere classified Plan Wound Cleansing: Wound #2 Left,Distal,Anterior Lower Leg: Clean wound with Normal Saline. Cleanse wound with mild soap and water May Shower, gently pat wound dry prior to applying new dressing. Anesthetic: Wound #2 Left,Distal,Anterior Lower Leg: Topical Lidocaine 4% cream applied to wound bed prior to debridement - for clinic use Skin Barriers/Peri-Wound Care: Wound  #2 Left,Distal,Anterior Lower Leg: Barrier cream Primary Wound Dressing: Wound #2 Left,Distal,Anterior Lower Leg: Prisma Ag - please moisten with saline Secondary Dressing: Wound #2 Left,Distal,Anterior Lower Leg: Gauze and Kerlix/Conform Dressing Change Frequency: Wound #2 Left,Distal,Anterior Lower Leg: Change dressing every other day. Follow-up Appointments: Wound #2 Left,Distal,Anterior Lower Leg: Return Appointment in 1 week. Edema Control: Wound #2 Left,Distal,Anterior Lower Leg: Patient to wear own compression stockings Elevate legs to the level of the heart and pump ankles as often as possible Jaime Zuniga, Jaime Zuniga ([id number]) Support Garment 20-30 mm/Hg pressure to: - HOME HEALTH TO PLEASE ORDER STOCKINGS FOR PATIENT. Additional Orders / Instructions: Wound #2 Left,Distal,Anterior Lower Leg: Stop Smoking Increase protein intake. Home Health: Wound #2 Left,Distal,Anterior Lower Leg: Continue Home Health Visits - [facility] Health Nurse may visit PRN to address patient s wound care needs. - HOME HEALTH TO PLEASE ORDER COMPRESSION STOCKINGS FACE TO FACE ENCOUNTER: MEDICARE and MEDICAID PATIENTS: I certify that this patient is under my care and that I had a face-to-face encounter that meets the physician face-to-face encounter requirements with this patient on this date. The encounter with the patient was in whole or in part for the following MEDICAL CONDITION: (primary reason for Home  Healthcare) MEDICAL NECESSITY: I certify, that based on my findings, NURSING services are a medically necessary home health service. HOME BOUND STATUS: I certify that my clinical findings support that this patient is homebound (i.e., Due to illness or injury, pt requires aid of supportive devices such as crutches, cane, wheelchairs, walkers, the use of special transportation or the assistance of another person to leave their place of residence. There is a normal inability to leave the home and  doing so requires considerable and taxing effort. Other absences are for medical reasons / religious services and are infrequent or of short duration when for other reasons). If current dressing causes regression in wound condition, may D/C ordered dressing product/s and apply Normal Saline Moist Dressing daily until next Wound Healing Center / Other MD appointment. Notify Wound Healing Center of regression in wound condition at [phone]. Please direct any NON-WOUND related issues/requests for orders to patient's Primary Care Physician I have recommended: 1. Prisma Ag to the wounds and lightly covered with a gauze and Kerlix dressing to be changed daily. 2. for his Lymphedema, he is to use compression stockings 20-30 mm 3. He says has stopped smoking since we spoke to him last 4. regular visits to the wound center Electronic Signature(s) Signed: 05/04/2016 4:29:23 PM By: Evlyn KannerBritto, Pressley Tadesse MD, FACS Previous Signature: 05/04/2016 2:29:39 PM Version By: Evlyn KannerBritto, Linh Hedberg MD, FACS Entered By: Evlyn KannerBritto, Coni Homesley on 05/04/2016 16:29:23 Jaime Zuniga, Jaime Zuniga ([id number]) -------------------------------------------------------------------------------- SuperBill Details Patient Name: Jaime Zuniga, Jaime Zuniga Date of Service: 05/04/2016 Medical Record Number: [id number] Patient Account Number: [id number] Date of Birth/Sex: 1950-09-13 31(65 y.o. Male) Treating RN: Curtis Sitesorthy, Joanna Primary Care Physician: Evelene CroonNiemeyer, Meindert Other Clinician: Referring Physician: Evelene CroonNiemeyer, Meindert Treating Physician/Extender: Rudene ReBritto, Leeya Rusconi Weeks in Treatment: 11 Diagnosis Coding ICD-10 Codes Code Description [phone] Non-pressure chronic ulcer of left calf with fat layer exposed M61.462 Other calcification of muscle, left lower leg I73.9 Peripheral vascular disease, unspecified I89.0 Lymphedema, not elsewhere classified Facility Procedures CPT4 Code: [id number] Description: [id number] - WOUND CARE VISIT-LEV 2 EST PT Modifier: Quantity: 1 Physician  Procedures CPT4 Code: [id number] Description: 99213 - WC PHYS LEVEL 3 - EST PT ICD-10 Description Diagnosis L97.222 Non-pressure chronic ulcer of left calf with fat M61.462 Other calcification of muscle, left lower leg I73.9 Peripheral vascular disease, unspecified I89.0 Lymphedema, not  elsewhere classified Modifier: layer exposed Quantity: 1 Electronic Signature(s) Signed: 05/04/2016 2:30:39 PM By: Evlyn KannerBritto, Keeli Roberg MD, FACS Entered By: Evlyn KannerBritto, Delonta Yohannes on 05/04/2016 14:30:39

## 2016-05-11 ENCOUNTER — Encounter: Payer: Medicare Other | Attending: General Surgery | Admitting: General Surgery

## 2016-05-11 DIAGNOSIS — L97222 Non-pressure chronic ulcer of left calf with fat layer exposed: Secondary | ICD-10-CM | POA: Insufficient documentation

## 2016-05-11 DIAGNOSIS — I890 Lymphedema, not elsewhere classified: Secondary | ICD-10-CM | POA: Insufficient documentation

## 2016-05-11 DIAGNOSIS — M61462 Other calcification of muscle, left lower leg: Secondary | ICD-10-CM | POA: Diagnosis not present

## 2016-05-11 DIAGNOSIS — I739 Peripheral vascular disease, unspecified: Secondary | ICD-10-CM | POA: Diagnosis not present

## 2016-05-11 NOTE — Progress Notes (Signed)
Venous ulcer leg.  Debrided and dressed with prisma  daily

## 2016-05-12 NOTE — Progress Notes (Signed)
Jaime Zuniga, Jaime Zuniga ([id number]) Visit Report for 05/11/2016 Physician Orders Details Patient Name: Jaime Zuniga, Jaime Zuniga Date of Service: 05/11/2016 12:45 PM Medical Record Number: [id number] Patient Account Number: [id number] Date of Birth/Sex: 1950/12/19 (65 y.o. Male) Treating RN: Curtis Sites Primary Care Physician: Evelene Croon Other Clinician: Referring Physician: Evelene Croon Treating Physician/Extender: Elayne Snare in Treatment: 12 Verbal / Phone Orders: Yes Clinician: Curtis Sites Read Back and Verified: Yes Diagnosis Coding Wound Cleansing Wound #2 Left,Distal,Anterior Lower Leg o Clean wound with Normal Saline. o Cleanse wound with mild soap and water o May Shower, gently pat wound dry prior to applying new dressing. Anesthetic Wound #2 Left,Distal,Anterior Lower Leg o Topical Lidocaine 4% cream applied to wound bed prior to debridement - for clinic use Skin Barriers/Peri-Wound Care Wound #2 Left,Distal,Anterior Lower Leg o Barrier cream Primary Wound Dressing Wound #2 Left,Distal,Anterior Lower Leg o Prisma Ag Secondary Dressing Wound #2 Left,Distal,Anterior Lower Leg o Boardered Foam Dressing - dry gauze Dressing Change Frequency Wound #2 Left,Distal,Anterior Lower Leg o Change dressing every other day. Follow-up Appointments Wound #2 Left,Distal,Anterior Lower Leg o Return Appointment in 1 week. Edema Control Jaime Zuniga, Jaime Zuniga ([id number]) Wound #2 Left,Distal,Anterior Lower Leg o Patient to wear own compression stockings o Elevate legs to the level of the heart and pump ankles as often as possible o Support Garment 20-30 mm/Hg pressure to: - HOME HEALTH TO PLEASE ORDER STOCKINGS FOR PATIENT. Additional Orders / Instructions Wound #2 Left,Distal,Anterior Lower Leg o Stop Smoking o Increase protein intake. Home Health Wound #2 Left,Distal,Anterior Lower Leg o Continue Home Health Visits - [facility] o Home Health Nurse  may visit PRN to address patientos wound care needs. - HOME HEALTH TO PLEASE ORDER COMPRESSION STOCKINGS o FACE TO FACE ENCOUNTER: MEDICARE and MEDICAID PATIENTS: I certify that this patient is under my care and that I had a face-to-face encounter that meets the physician face-to-face encounter requirements with this patient on this date. The encounter with the patient was in whole or in part for the following MEDICAL CONDITION: (primary reason for Home Healthcare) MEDICAL NECESSITY: I certify, that based on my findings, NURSING services are a medically necessary home health service. HOME BOUND STATUS: I certify that my clinical findings support that this patient is homebound (i.e., Due to illness or injury, pt requires aid of supportive devices such as crutches, cane, wheelchairs, walkers, the use of special transportation or the assistance of another person to leave their place of residence. There is a normal inability to leave the home and doing so requires considerable and taxing effort. Other absences are for medical reasons / religious services and are infrequent or of short duration when for other reasons). o If current dressing causes regression in wound condition, may D/C ordered dressing product/s and apply Normal Saline Moist Dressing daily until next Wound Healing Center / Other MD appointment. Notify Wound Healing Center of regression in wound condition at [phone]. o Please direct any NON-WOUND related issues/requests for orders to patient's Primary Care Physician Electronic Signature(s) Signed: 05/11/2016 3:59:06 PM By: Ardath Sax MD Signed: 05/11/2016 4:26:40 PM By: Curtis Sites Entered By: Curtis Sites on 05/11/2016 13:42:16 Jaime Zuniga ([id number]) -------------------------------------------------------------------------------- Problem List Details Patient Name: Jaime Zuniga Date of Service: 05/11/2016 12:45 PM Medical Record Number: [id number] Patient  Account Number: [id number] Date of Birth/Sex: 02-15-1951 (65 y.o. 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 M61.462 Other calcification of muscle, left lower leg 02/11/2016 Yes I73.9 Peripheral vascular disease, unspecified 02/11/2016 Yes I89.0 Lymphedema, not elsewhere classified 04/20/2016 Yes Inactive Problems Resolved Problems Electronic Signature(s) Signed: 05/11/2016 3:14:57 PM By: Ardath Sax MD Entered By: Ardath Sax on 05/11/2016 15:14:56 Jaime Zuniga ([id number]) -------------------------------------------------------------------------------- Progress Note Details Patient Name: Jaime Zuniga Date of Service: 05/11/2016 12:45 PM Medical Record Number: [id number] Patient Account Number: [id number] Date of Birth/Sex: Oct 21, 1950 (65 y.o. Male) Treating RN: Curtis Sites Primary Care Physician: Evelene Croon Other Clinician: Referring Physician: Evelene Croon Treating Physician/Extender: Ardath Sax Weeks in Treatment: 12 Objective Constitutional Vitals Time Taken: 12:59 PM, Height: 72 in, Weight: 210 lbs, BMI: 28.5, Temperature: 98.0 F, Pulse: 79 bpm, Respiratory Rate: 16 breaths/min, Blood Pressure: 103/56 mmHg. Integumentary (Hair, Skin) Wound #2 status is Open. Original cause of wound was Gradually Appeared. The wound is located on the [facility] Lower Leg. The wound measures 0.4cm length x 0.3cm width x 0.1cm depth; 0.094cm^2 area and 0.009cm^3 volume. There is no tunneling or undermining noted. There is a large amount of serosanguineous drainage noted. The wound margin is distinct with the outline attached to the wound base. There is  large (67-100%) pink granulation within the wound bed. There is a small (1-33%) amount of necrotic tissue within the wound bed including Adherent Slough. The periwound skin appearance exhibited: Induration, Scarring, Moist. The periwound skin appearance did not exhibit: Callus, Crepitus, Excoriation, Fluctuance, Friable, Localized Edema, Rash, Dry/Scaly, Maceration, Atrophie Blanche, Cyanosis, Ecchymosis, Hemosiderin Staining, Mottled, Pallor, Rubor, Erythema. Periwound temperature was noted as No Abnormality. The periwound has tenderness on palpation. 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 Dressed with alginate and 2 layer wrap applied Jaime Zuniga, Jaime Zuniga ([id number]) Plan Wound Cleansing: Wound #2 Left,Distal,Anterior Lower Leg: Clean wound with Normal Saline. Cleanse wound with mild soap and water May Shower, gently pat wound dry prior to applying new dressing. Anesthetic: Wound #2 Left,Distal,Anterior Lower Leg: Topical Lidocaine 4% cream applied to wound bed prior to debridement - for clinic use Skin Barriers/Peri-Wound Care: Wound #2 Left,Distal,Anterior Lower Leg: Barrier cream Primary Wound Dressing: Wound #2 Left,Distal,Anterior Lower Leg: Prisma Ag Secondary Dressing: Wound #2 Left,Distal,Anterior Lower Leg: Boardered Foam Dressing - dry gauze Dressing Change Frequency: Wound #2 Left,Distal,Anterior Lower Leg: Change dressing every other day. Follow-up Appointments: Wound #2 Left,Distal,Anterior Lower Leg: Return Appointment in 1 week. Edema Control: Wound #2 Left,Distal,Anterior Lower Leg: Patient to wear own compression stockings Elevate legs to the level of the heart and pump ankles as often as possible Support  Garment 20-30 mm/Hg pressure to: - HOME HEALTH TO PLEASE ORDER STOCKINGS FOR PATIENT. Additional Orders / Instructions: Wound #2 Left,Distal,Anterior Lower Leg: Stop Smoking Increase protein intake. Home Health: Wound #2 Left,Distal,Anterior Lower Leg: Continue [facility] Health Visits - [facility] Nurse may visit PRN to address patient s wound care needs. - HOME HEALTH TO PLEASE ORDER COMPRESSION STOCKINGS FACE TO FACE ENCOUNTER: MEDICARE and MEDICAID PATIENTS: I certify that this patient is under my care and that I had a face-to-face encounter that meets the physician face-to-face encounter requirements with this patient on this date. The encounter with the patient was in whole or in part for the following MEDICAL CONDITION: (primary reason for Home Healthcare)  MEDICAL NECESSITY: I certify, that based on my findings, NURSING services are a medically necessary home health service. HOME BOUND STATUS: I certify that my clinical findings support that this patient is homebound (i.e., Due to illness or injury, pt requires aid of supportive devices such as crutches, cane, wheelchairs, walkers, the use of special transportation or the assistance of another person to leave their place of residence. There is a normal inability to leave the home and doing so requires considerable and taxing effort. Other absences are Jaime Zuniga, Jaime Zuniga ([id number]) for medical reasons / religious services and are infrequent or of short duration when for other reasons). If current dressing causes regression in wound condition, may D/C ordered dressing product/s and apply Normal Saline Moist Dressing daily until next Wound Healing Center / Other MD appointment. Notify Wound Healing Center of regression in wound condition at [phone]. Please direct any NON-WOUND related issues/requests for orders to patient's Primary Care Physician Follow-Up Appointments: A follow-up appointment should be scheduled. A Patient  Clinical Summary of Care [facility] Electronic Signature(s) Signed: 05/11/2016 3:17:20 PM By: Ardath SaxParker, Rector Devonshire MD Entered By: Ardath SaxParker, Delvonte Berenson on 05/11/2016 15:17:19 Jaime Zuniga, Jaime Zuniga ([id number]) -------------------------------------------------------------------------------- SuperBill Details Patient Name: Jaime Zuniga, Jaime Zuniga Date of Service: 05/11/2016 Medical Record Number: [id number] Patient Account Number: [id number] Date of Birth/Sex: 11/25/1950 71(65 y.o. Male) Treating RN: Curtis Sitesorthy, Joanna Primary Care Physician: Evelene CroonNiemeyer, Meindert Other Clinician: Referring Physician: Evelene CroonNiemeyer, Meindert Treating Physician/Extender: Elayne SnarePARKER, Sabriah Hobbins Weeks in Treatment: 12 Diagnosis Coding ICD-10 Codes Code Description [phone] Non-pressure chronic ulcer of left calf with fat layer exposed M61.462 Other calcification of muscle, left lower leg I73.9 Peripheral vascular disease, unspecified I89.0 Lymphedema, not elsewhere classified Facility Procedures CPT4 Code: [id number] Description: 99213 - WOUND CARE VISIT-LEV 3 EST PT Modifier: Quantity: 1 Physician Procedures CPT4 Code: [id number] Description: [id number] - WC PHYS LEVEL 2 - EST PT ICD-10 Description Diagnosis L97.222 Non-pressure chronic ulcer of left calf with fat I89.0 Lymphedema, not elsewhere classified Modifier: layer exposed Quantity: 1 Electronic Signature(s) Signed: 05/11/2016 3:17:54 PM By: Ardath SaxParker, Clay Menser MD Entered By: Ardath SaxParker, Tatum Massman on 05/11/2016 15:17:54

## 2016-05-12 NOTE — Progress Notes (Signed)
ORDELL, PRICHETT ([id number]) Visit Report for 05/11/2016 Arrival Information Details Patient Name: RUVIM, RISKO Date of Service: 05/11/2016 12:45 PM Medical Record Number: [id number] Patient Account Number: [id number] Date of Birth/Sex: May 28, 1951 (65 y.o. Male) Treating RN: Curtis Sites Primary Care Physician: Evelene Croon Other Clinician: Referring Physician: Evelene Croon Treating Physician/Extender: Elayne Snare in Treatment: 12 Visit Information History Since Last Visit Added or deleted any medications: No Patient Arrived: Walker Any new allergies or adverse reactions: No Arrival Time: 12:57 Had a fall or experienced change in No Accompanied By: staff activities of daily living that may affect Transfer Assistance: None risk of falls: Patient Identification Verified: Yes Signs or symptoms of abuse/neglect since last No Secondary Verification Process Completed: Yes visito Patient Requires Transmission-Based No Hospitalized since last visit: No Precautions: Pain Present Now: No Patient Has Alerts: No Electronic Signature(s) Signed: 05/11/2016 4:26:40 PM By: Curtis Sites Entered By: Curtis Sites on 05/11/2016 12:57:58 [name] ([id number]) -------------------------------------------------------------------------------- Clinic Level of Care Assessment Details Patient Name: [name] Date of Service: 05/11/2016 12:45 PM Medical Record Number: [id number] Patient Account Number: [id number] Date of Birth/Sex: 07-Apr-1951 (65 y.o. Male) Treating RN: Curtis Sites Primary Care Physician: Evelene Croon Other Clinician: Referring Physician: Evelene Croon Treating Physician/Extender: Elayne Snare in Treatment: 12 Clinic Level of Care Assessment Items TOOL 4 Quantity Score []  - Use when only an EandM is performed on FOLLOW-UP visit 0 ASSESSMENTS - Nursing Assessment / Reassessment X - Reassessment of Co-morbidities (includes updates in  patient status) 1 10 X - Reassessment of Adherence to Treatment Plan 1 5 ASSESSMENTS - Wound and Skin Assessment / Reassessment X - Simple Wound Assessment / Reassessment - one wound 1 5 []  - Complex Wound Assessment / Reassessment - multiple wounds 0 []  - Dermatologic / Skin Assessment (not related to wound area) 0 ASSESSMENTS - Focused Assessment []  - Circumferential Edema Measurements - multi extremities 0 []  - Nutritional Assessment / Counseling / Intervention 0 X - Lower Extremity Assessment (monofilament, tuning fork, pulses) 1 5 []  - Peripheral Arterial Disease Assessment (using hand held doppler) 0 ASSESSMENTS - Ostomy and/or Continence Assessment and Care []  - Incontinence Assessment and Management 0 []  - Ostomy Care Assessment and Management (repouching, etc.) 0 PROCESS - Coordination of Care X - Simple Patient / Family Education for ongoing care 1 15 []  - Complex (extensive) Patient / Family Education for ongoing care 0 []  - Staff obtains Chiropractor, Records, Test Results / Process Orders 0 []  - Staff telephones HHA, Nursing Homes / Clarify orders / etc 0 []  - Routine Transfer to another Facility (non-emergent condition) 0 Deignan, Rohn ([id number]) []  - Routine Hospital Admission (non-emergent condition) 0 []  - New Admissions / Manufacturing engineer / Ordering NPWT, Apligraf, etc. 0 []  - Emergency Hospital Admission (emergent condition) 0 X - Simple Discharge Coordination 1 10 []  - Complex (extensive) Discharge Coordination 0 PROCESS - Special Needs []  - Pediatric / Minor Patient Management 0 []  - Isolation Patient Management 0 []  - Hearing / Language / Visual special needs 0 []  - Assessment of Community assistance (transportation, D/C planning, etc.) 0 []  - Additional assistance / Altered mentation 0 []  - Support Surface(s) Assessment (bed, cushion, seat, etc.) 0 INTERVENTIONS - Wound Cleansing / Measurement X - Simple Wound Cleansing - one wound 1 5 []  - Complex Wound  Cleansing - multiple wounds 0 X - Wound Imaging (photographs - any number of wounds) 1 5 []  - Wound Tracing (instead of photographs) 0 X - Simple Wound Measurement -  one wound 1 5 []  - Complex Wound Measurement - multiple wounds 0 INTERVENTIONS - Wound Dressings X - Small Wound Dressing one or multiple wounds 1 10 []  - Medium Wound Dressing one or multiple wounds 0 []  - Large Wound Dressing one or multiple wounds 0 []  - Application of Medications - topical 0 []  - Application of Medications - injection 0 INTERVENTIONS - Miscellaneous []  - External ear exam 0 Dorer, Deuntae ([id number]) []  - Specimen Collection (cultures, biopsies, blood, body fluids, etc.) 0 []  - Specimen(s) / Culture(s) sent or taken to Lab for analysis 0 []  - Patient Transfer (multiple staff / Michiel Sites Lift / Similar devices) 0 []  - Simple Staple / Suture removal (25 or less) 0 []  - Complex Staple / Suture removal (26 or more) 0 []  - Hypo / Hyperglycemic Management (close monitor of Blood Glucose) 0 []  - Ankle / Brachial Index (ABI) - do not check if billed separately 0 X - Vital Signs 1 5 Has the patient been seen at the hospital within the last three years: Yes Total Score: 80 Level Of Care: New/Established - Level 3 Electronic Signature(s) Signed: 05/11/2016 4:26:40 PM By: Curtis Sites Entered By: Curtis Sites on 05/11/2016 13:43:47 [name] ([id number]) -------------------------------------------------------------------------------- Encounter Discharge Information Details Patient Name: [name] Date of Service: 05/11/2016 12:45 PM Medical Record Number: [id number] Patient Account Number: [id number] Date of Birth/Sex: 29-Dec-1950 (65 y.o. Male) Treating RN: Curtis Sites Primary Care Physician: Evelene Croon Other Clinician: Referring Physician: Evelene Croon Treating Physician/Extender: Elayne Snare in Treatment: 12 Encounter Discharge Information Items Discharge Pain Level:  0 Discharge Condition: Stable Ambulatory Status: Walker Discharge Destination: Home Transportation: Private Auto Accompanied By: staff Schedule Follow-up Appointment: Yes Medication Reconciliation completed No and provided to Patient/Care Sailor Haughn: Provided on Clinical Summary of Care: 05/11/2016 Form Type Recipient Paper Patient SB Electronic Signature(s) Signed: 05/11/2016 3:18:18 PM By: Ardath Sax MD Previous Signature: 05/11/2016 1:25:42 PM Version By: Gwenlyn Perking Entered By: Ardath Sax on 05/11/2016 15:18:18 [name] ([id number]) -------------------------------------------------------------------------------- Lower Extremity Assessment Details Patient Name: [name] Date of Service: 05/11/2016 12:45 PM Medical Record Number: [id number] Patient Account Number: [id number] Date of Birth/Sex: 1950/12/19 (65 y.o. Male) Treating RN: Curtis Sites Primary Care Physician: Evelene Croon Other Clinician: Referring Physician: Evelene Croon Treating Physician/Extender: Ardath Sax Weeks in Treatment: 12 Edema Assessment Assessed: [Left: No] [Right: No] Edema: [Left: N] [Right: o] Vascular Assessment Pulses: Posterior Tibial Dorsalis Pedis Palpable: [Left:Yes] Extremity colors, hair growth, and conditions: Extremity Color: [Left:Hyperpigmented] Hair Growth on Extremity: [Left:No] Temperature of Extremity: [Left:Warm] Capillary Refill: [Left:< 3 seconds] Electronic Signature(s) Signed: 05/11/2016 4:26:40 PM By: Curtis Sites Entered By: Curtis Sites on 05/11/2016 13:16:37 [name] ([id number]) -------------------------------------------------------------------------------- Multi Wound Chart Details Patient Name: [name] Date of Service: 05/11/2016 12:45 PM Medical Record Number: [id number] Patient Account Number: [id number] Date of Birth/Sex: February 22, 1951 (65 y.o. Male) Treating RN: Curtis Sites Primary Care Physician: Evelene Croon Other Clinician: Referring Physician: Evelene Croon Treating Physician/Extender: Ardath Sax Weeks in Treatment: 12 Vital Signs Height(in): 72 Pulse(bpm): 79 Weight(lbs): 210 Blood Pressure 103/56 (mmHg): Body Mass Index(BMI): 28 Temperature(F): 98.0 Respiratory Rate 16 (breaths/min): Photos: [N/A:N/A] Wound Location: Left Lower Leg - Anterior, N/A N/A Distal Wounding Event: Gradually Appeared N/A N/A Primary Etiology: Venous Leg Ulcer N/A N/A Comorbid History: Chronic Obstructive N/A N/A Pulmonary Disease (COPD), Hypertension, Myocardial Infarction, Osteoarthritis, Dementia Date Acquired: 01/26/2016 N/A N/A Weeks of Treatment: 12 N/A N/A Wound Status: Open N/A N/A Measurements L x W x D 0.4x0.3x0.1 N/A N/A (cm)  Area (cm) : 0.094 N/A N/A Volume (cm) : 0.009 N/A N/A % Reduction in Area: 92.00% N/A N/A % Reduction in Volume: 96.20% N/A N/A Classification: Full Thickness Without N/A N/A Exposed Support Structures Exudate Amount: Large N/A N/A Exudate Type: Serosanguineous N/A N/A Dacey, Kharon ([id number]) Exudate Color: red, brown N/A N/A Wound Margin: Distinct, outline attached N/A N/A Granulation Amount: Large (67-100%) N/A N/A Granulation Quality: Pink N/A N/A Necrotic Amount: Small (1-33%) N/A N/A Exposed Structures: Fascia: No N/A N/A Fat: No Tendon: No Muscle: No Joint: No Bone: No Epithelialization: Small (1-33%) N/A N/A Periwound Skin Texture: Induration: Yes N/A N/A Scarring: Yes Edema: No Excoriation: No Callus: No Crepitus: No Fluctuance: No Friable: No Rash: No Periwound Skin Moist: Yes N/A N/A Moisture: Maceration: No Dry/Scaly: No Periwound Skin Color: Atrophie Blanche: No N/A N/A Cyanosis: No Ecchymosis: No Erythema: No Hemosiderin Staining: No Mottled: No Pallor: No Rubor: No Temperature: No Abnormality N/A N/A Tenderness on Yes N/A N/A Palpation: Wound Preparation: Ulcer Cleansing: N/A  N/A Rinsed/Irrigated with Saline Topical Anesthetic Applied: None Treatment Notes Electronic Signature(s) Signed: 05/11/2016 4:26:40 PM By: Curtis Sites Entered By: Curtis Sites on 05/11/2016 13:17:45 [name] ([id number]) -------------------------------------------------------------------------------- Multi-Disciplinary Care Plan Details Patient Name: [name] Date of Service: 05/11/2016 12:45 PM Medical Record Number: [id number] Patient Account Number: [id number] Date of Birth/Sex: [birth year] (65 y.o. Male) Treating RN: Curtis Sites Primary Care Physician: Evelene Croon Other Clinician: Referring Physician: Evelene Croon Treating Physician/Extender: Elayne Snare in Treatment: 12 Active Inactive Abuse / Safety / Falls / Self Care Management Nursing Diagnoses: Potential for falls Goals: Patient will remain injury free Date Initiated: 02/11/2016 Goal Status: Active Interventions: Assess fall risk on admission and as needed Assess self care needs on admission and as needed Notes: Nutrition Nursing Diagnoses: Imbalanced nutrition Goals: Patient/caregiver agrees to and verbalizes understanding of need to use nutritional supplements and/or vitamins as prescribed Date Initiated: 02/11/2016 Goal Status: Active Interventions: Assess patient nutrition upon admission and as needed per policy Notes: Orientation to the Wound Care Program Nursing Diagnoses: Knowledge deficit related to the wound healing center program GoalsROMANI, WILBON ([id number]) Patient/caregiver will verbalize understanding of the Wound Healing Center Program Date Initiated: 02/11/2016 Goal Status: Active Interventions: Provide education on orientation to the wound center Notes: Pain, Acute or Chronic Nursing Diagnoses: Pain, acute or chronic: actual or potential Potential alteration in comfort, pain Goals: Patient will verbalize adequate pain control and receive pain  control interventions during procedures as needed Date Initiated: 02/11/2016 Goal Status: Active Patient/caregiver will verbalize adequate pain control between visits Date Initiated: 02/11/2016 Goal Status: Active Interventions: Assess comfort goal upon admission Complete pain assessment as per visit requirements Notes: Soft Tissue Infection Nursing Diagnoses: Impaired tissue integrity Goals: Patient/caregiver will verbalize understanding of or measures to prevent infection and contamination in the home setting Date Initiated: 02/11/2016 Goal Status: Active Patient's soft tissue infection will resolve Date Initiated: 02/11/2016 Goal Status: Active Interventions: Assess signs and symptoms of infection every visit KARRON, ALVIZO ([id number]) Notes: Wound/Skin Impairment Nursing Diagnoses: Impaired tissue integrity Knowledge deficit related to smoking impact on wound healing Goals: Patient will demonstrate a reduced rate of smoking or cessation of smoking Date Initiated: 02/11/2016 Goal Status: Active Ulcer/skin breakdown will have a volume reduction of 30% by week 4 Date Initiated: 02/11/2016 Goal Status: Active Ulcer/skin breakdown will have a volume reduction of 50% by week 8 Date Initiated: 02/11/2016 Goal Status: Active Ulcer/skin breakdown will have a volume reduction of 80% by week 12 Date Initiated: 02/11/2016  Goal Status: Active Interventions: Assess ulceration(s) every visit Notes: Electronic Signature(s) Signed: 05/11/2016 4:26:40 PM By: Curtis Sitesorthy, Joanna Entered By: Curtis Sitesorthy, Joanna on 05/11/2016 13:17:36 Nada MaclachlanBURTON, Yahsir ([id number]) -------------------------------------------------------------------------------- Pain Assessment Details Patient Name: Nada MaclachlanBURTON, Tylek Date of Service: 05/11/2016 12:45 PM Medical Record Number: [id number] Patient Account Number: [id number] Date of Birth/Sex: [birth year](65 y.o. Male) Treating RN: Curtis Sitesorthy, Joanna Primary Care Physician: Evelene CroonNiemeyer,  Meindert Other Clinician: Referring Physician: Evelene CroonNiemeyer, Meindert Treating Physician/Extender: Elayne SnarePARKER, PETER Weeks in Treatment: 12 Active Problems Location of Pain Severity and Description of Pain Patient Has Paino No Site Locations Pain Management and Medication Current Pain Management: Notes Topical or injectable lidocaine is offered to patient for acute pain when surgical debridement is performed. If needed, Patient is instructed to use over the counter pain medication for the following 24-48 hours after debridement. Wound care MDs do not prescribed pain medications. Patient has chronic pain or uncontrolled pain. Patient has been instructed to make an appointment with their Primary Care Physician for pain management. Electronic Signature(s) Signed: 05/11/2016 4:26:40 PM By: Curtis Sitesorthy, Joanna Entered By: Curtis Sitesorthy, Joanna on 05/11/2016 12:58:07 Nada MaclachlanBURTON, Keoni ([id number]) -------------------------------------------------------------------------------- Patient/Caregiver Education Details Patient Name: Nada MaclachlanBURTON, Sehaj Date of Service: 05/11/2016 12:45 PM Medical Record Number: [id number] Patient Account Number: [id number] Date of Birth/Gender: [birth year] [age] y.o. Male) Treating RN: Curtis Sitesorthy, Joanna Primary Care Physician: Evelene CroonNiemeyer, Meindert Other Clinician: Referring Physician: Evelene CroonNiemeyer, Meindert Treating Physician/Extender: Elayne SnarePARKER, PETER Weeks in Treatment: 12 Education Assessment Education Provided To: Patient Education Topics Provided Wound/Skin Impairment: Handouts: Other: wound care as ordered Methods: Demonstration, Explain/Verbal Responses: State content correctly Electronic Signature(s) Signed: 05/11/2016 3:59:06 PM By: Ardath SaxParker, Peter MD Entered By: Ardath SaxParker, Peter on 05/11/2016 15:18:25 Nada MaclachlanBURTON, Eutimio ([id number]) -------------------------------------------------------------------------------- Wound Assessment Details Patient Name: Nada MaclachlanBURTON, Donelle Date of Service: 05/11/2016 12:45  PM Medical Record Number: [id number] Patient Account Number: [id number] Date of Birth/Sex: [birth year] [age] y.o. Male) Treating RN: Curtis Sitesorthy, Joanna Primary Care Physician: Evelene CroonNiemeyer, Meindert Other Clinician: Referring Physician: Evelene CroonNiemeyer, Meindert Treating Physician/Extender: Elayne SnarePARKER, PETER Weeks in Treatment: 12 Wound Status Wound Number: 2 Primary Venous Leg Ulcer Etiology: Wound Location: Left Lower Leg - Anterior, Distal Wound Open Status: Wounding Event: Gradually Appeared Comorbid Chronic Obstructive Pulmonary Disease Date Acquired: 01/26/2016 History: (COPD), Hypertension, Myocardial Weeks Of Treatment: 12 Infarction, Osteoarthritis, Dementia Clustered Wound: No Photos Wound Measurements Length: (cm) 0.4 Width: (cm) 0.3 Depth: (cm) 0.1 Area: (cm) 0.094 Volume: (cm) 0.009 % Reduction in Area: 92% % Reduction in Volume: 96.2% Epithelialization: Small (1-33%) Tunneling: No Undermining: No Wound Description Full Thickness Without Exposed Classification: Support Structures Wound Margin: Distinct, outline attached Exudate Large Amount: Exudate Type: Serosanguineous Exudate Color: red, brown Foul Odor After Cleansing: No Wound Bed Granulation Amount: Large (67-100%) Exposed Structure Granulation Quality: Pink Fascia Exposed: No Necrotic Amount: Small (1-33%) Fat Layer Exposed: No Feuerborn, Farah ([id number]) Necrotic Quality: Adherent Slough Tendon Exposed: No Muscle Exposed: No Joint Exposed: No Bone Exposed: No Periwound Skin Texture Texture Color No Abnormalities Noted: No No Abnormalities Noted: No Callus: No Atrophie Blanche: No Crepitus: No Cyanosis: No Excoriation: No Ecchymosis: No Fluctuance: No Erythema: No Friable: No Hemosiderin Staining: No Induration: Yes Mottled: No Localized Edema: No Pallor: No Rash: No Rubor: No Scarring: Yes Temperature / Pain Moisture Temperature: No Abnormality No Abnormalities Noted: No Tenderness on  Palpation: Yes Dry / Scaly: No Maceration: No Moist: Yes Wound Preparation Ulcer Cleansing: Rinsed/Irrigated with Saline Topical Anesthetic Applied: None Treatment Notes Wound #2 (Left, Distal, Anterior Lower Leg) 1. Cleansed with: Clean wound with Normal Saline 2. Anesthetic Topical Lidocaine 4% cream to  wound bed prior to debridement 4. Dressing Applied: Prisma Ag 5. Secondary Dressing Applied Bordered Foam Dressing Dry Gauze Notes netting Electronic Signature(s) Signed: 05/11/2016 4:26:40 PM By: Curtis Sites Entered By: Curtis Sites on 05/11/2016 13:17:14 [name] ([id number]) -------------------------------------------------------------------------------- Vitals Details Patient Name: [name] Date of Service: 05/11/2016 12:45 PM Medical Record Number: [id number] Patient Account Number: [id number] Date of Birth/Sex: 1951/06/28 (65 y.o. Male) Treating RN: Curtis Sites Primary Care Physician: Evelene Croon Other Clinician: Referring Physician: Evelene Croon Treating Physician/Extender: Elayne Snare in Treatment: 12 Vital Signs Time Taken: 12:59 Temperature (F): 98.0 Height (in): 72 Pulse (bpm): 79 Weight (lbs): 210 Respiratory Rate (breaths/min): 16 Body Mass Index (BMI): 28.5 Blood Pressure (mmHg): 103/56 Reference Range: 80 - 120 mg / dl Electronic Signature(s) Signed: 05/11/2016 4:26:40 PM By: Curtis Sites Entered By: Curtis Sites on 05/11/2016 13:00:27

## 2016-05-18 ENCOUNTER — Encounter (HOSPITAL_BASED_OUTPATIENT_CLINIC_OR_DEPARTMENT_OTHER): Payer: Medicare Other | Admitting: General Surgery

## 2016-05-18 DIAGNOSIS — L97222 Non-pressure chronic ulcer of left calf with fat layer exposed: Secondary | ICD-10-CM

## 2016-05-18 NOTE — Progress Notes (Signed)
See I heal 

## 2016-05-19 NOTE — Progress Notes (Signed)
Jaime, Zuniga ([id number]) Visit Report for 05/18/2016 Arrival Information Details Patient Name: Jaime Zuniga, Jaime Zuniga Date of Service: 05/18/2016 10:00 AM Medical Record Number: [id number] Patient Account Number: [id number] Date of Birth/Sex: June 05, 1951 (65 y.o. Male) Treating RN: Curtis Sites Primary Care Physician: Evelene Croon Other Clinician: Referring Physician: Evelene Croon Treating Physician/Extender: Elayne Snare in Treatment: 13 Visit Information History Since Last Visit Added or deleted any medications: No Patient Arrived: Walker Any new allergies or adverse reactions: No Arrival Time: 10:10 Had a fall or experienced change in No Accompanied By: self activities of daily living that may affect Transfer Assistance: None risk of falls: Patient Identification Verified: Yes Signs or symptoms of abuse/neglect since last No Secondary Verification Process Completed: Yes visito Patient Requires Transmission-Based No Hospitalized since last visit: No Precautions: Pain Present Now: No Patient Has Alerts: No Electronic Signature(s) Signed: 05/18/2016 4:07:00 PM By: Curtis Sites Entered By: Curtis Sites on 05/18/2016 10:11:02 Jaime Zuniga ([id number]) -------------------------------------------------------------------------------- Clinic Level of Care Assessment Details Patient Name: Jaime Zuniga Date of Service: 05/18/2016 10:00 AM Medical Record Number: [id number] Patient Account Number: [id number] Date of Birth/Sex: 11-06-1950 (65 y.o. Male) Treating RN: Curtis Sites Primary Care Physician: Evelene Croon Other Clinician: Referring Physician: Evelene Croon Treating Physician/Extender: Elayne Snare in Treatment: 13 Clinic Level of Care Assessment Items TOOL 4 Quantity Score []  - Use when only an EandM is performed on FOLLOW-UP visit 0 ASSESSMENTS - Nursing Assessment / Reassessment X - Reassessment of Co-morbidities (includes updates  in patient status) 1 10 X - Reassessment of Adherence to Treatment Plan 1 5 ASSESSMENTS - Wound and Skin Assessment / Reassessment X - Simple Wound Assessment / Reassessment - one wound 1 5 []  - Complex Wound Assessment / Reassessment - multiple wounds 0 []  - Dermatologic / Skin Assessment (not related to wound area) 0 ASSESSMENTS - Focused Assessment []  - Circumferential Edema Measurements - multi extremities 0 []  - Nutritional Assessment / Counseling / Intervention 0 X - Lower Extremity Assessment (monofilament, tuning fork, pulses) 1 5 []  - Peripheral Arterial Disease Assessment (using hand held doppler) 0 ASSESSMENTS - Ostomy and/or Continence Assessment and Care []  - Incontinence Assessment and Management 0 []  - Ostomy Care Assessment and Management (repouching, etc.) 0 PROCESS - Coordination of Care X - Simple Patient / Family Education for ongoing care 1 15 []  - Complex (extensive) Patient / Family Education for ongoing care 0 []  - Staff obtains Chiropractor, Records, Test Results / Process Orders 0 []  - Staff telephones HHA, Nursing Homes / Clarify orders / etc 0 []  - Routine Transfer to another Facility (non-emergent condition) 0 Lusher, Reinhardt ([id number]) []  - Routine Hospital Admission (non-emergent condition) 0 []  - New Admissions / Manufacturing engineer / Ordering NPWT, Apligraf, etc. 0 []  - Emergency Hospital Admission (emergent condition) 0 X - Simple Discharge Coordination 1 10 []  - Complex (extensive) Discharge Coordination 0 PROCESS - Special Needs []  - Pediatric / Minor Patient Management 0 []  - Isolation Patient Management 0 []  - Hearing / Language / Visual special needs 0 []  - Assessment of Community assistance (transportation, D/C planning, etc.) 0 []  - Additional assistance / Altered mentation 0 []  - Support Surface(s) Assessment (bed, cushion, seat, etc.) 0 INTERVENTIONS - Wound Cleansing / Measurement X - Simple Wound Cleansing - one wound 1 5 []  - Complex  Wound Cleansing - multiple wounds 0 X - Wound Imaging (photographs - any number of wounds) 1 5 []  - Wound Tracing (instead of photographs) 0 X - Simple Wound Measurement -  one wound 1 5 []  - Complex Wound Measurement - multiple wounds 0 INTERVENTIONS - Wound Dressings X - Small Wound Dressing one or multiple wounds 1 10 []  - Medium Wound Dressing one or multiple wounds 0 []  - Large Wound Dressing one or multiple wounds 0 []  - Application of Medications - topical 0 []  - Application of Medications - injection 0 INTERVENTIONS - Miscellaneous []  - External ear exam 0 Zuniga, Jaime ([id number]) []  - Specimen Collection (cultures, biopsies, blood, body fluids, etc.) 0 []  - Specimen(s) / Culture(s) sent or taken to Lab for analysis 0 []  - Patient Transfer (multiple staff / Michiel Sites Lift / Similar devices) 0 []  - Simple Staple / Suture removal (25 or less) 0 []  - Complex Staple / Suture removal (26 or more) 0 []  - Hypo / Hyperglycemic Management (close monitor of Blood Glucose) 0 []  - Ankle / Brachial Index (ABI) - do not check if billed separately 0 X - Vital Signs 1 5 Has the patient been seen at the hospital within the last three years: Yes Total Score: 80 Level Of Care: New/Established - Level 3 Electronic Signature(s) Signed: 05/18/2016 4:07:00 PM By: Curtis Sites Entered By: Curtis Sites on 05/18/2016 12:41:59 Jaime Zuniga ([id number]) -------------------------------------------------------------------------------- Encounter Discharge Information Details Patient Name: Jaime Zuniga Date of Service: 05/18/2016 10:00 AM Medical Record Number: [id number] Patient Account Number: [id number] Date of Birth/Sex: [birth year] (66 y.o. Male) Treating RN: Curtis Sites Primary Care Physician: Evelene Croon Other Clinician: Referring Physician: Evelene Croon Treating Physician/Extender: Elayne Snare in Treatment: 13 Encounter Discharge Information Items Discharge Pain Level:  0 Discharge Condition: Stable Ambulatory Status: Walker Discharge Destination: Home Transportation: Private Auto Accompanied By: self Schedule Follow-up Appointment: Yes Medication Reconciliation completed and provided to Patient/Care No Brelan Hannen: Provided on Clinical Summary of Care: 05/18/2016 Form Type Recipient Paper Patient SB Electronic Signature(s) Signed: 05/18/2016 3:41:47 PM By: Ardath Sax MD Previous Signature: 05/18/2016 10:47:20 AM Version By: Gwenlyn Perking Previous Signature: 05/18/2016 10:21:15 AM Version By: Curtis Sites Entered By: Ardath Sax on 05/18/2016 11:11:20 Jaime Zuniga ([id number]) -------------------------------------------------------------------------------- General Visit Notes Details Patient Name: Jaime Zuniga Date of Service: 05/18/2016 10:00 AM Medical Record Number: [id number] Patient Account Number: [id number] Date of Birth/Sex: Aug 18, 1950 (65 y.o. Male) Treating RN: Curtis Sites Primary Care Physician: Evelene Croon Other Clinician: Referring Physician: Evelene Croon Treating Physician/Extender: Elayne Snare in Treatment: 13 Notes Patient arrived smelling heavily of urine. His leg was wrapped with kerlix and coban over BFD. The kerlix and coban wrapping was saturated in urine and his pants were soaked. i called [facility] home [facility] and spoke with his nurse Erie Noe who states that the reason patient has been wrapped is because they are having trouble getting his compression hose as ordered. i advised her to stop wrapping patient's leg because wearing a urine soaked wrap is going to cause further problems. I also advised her that the orders do not say to wrap patient's leg so that needs to stop without orders. She stated understanding. Electronic Signature(s) Signed: 05/18/2016 10:27:51 AM By: Curtis Sites Entered By: Curtis Sites on 05/18/2016 10:27:51 Jaime Zuniga  ([id number]) -------------------------------------------------------------------------------- Lower Extremity Assessment Details Patient Name: Jaime Zuniga Date of Service: 05/18/2016 10:00 AM Medical Record Number: [id number] Patient Account Number: [id number] Date of Birth/Sex: 27-Jan-1951 (65 y.o. Male) Treating RN: Curtis Sites Primary Care Physician: Evelene Croon Other Clinician: Referring Physician: Evelene Croon Treating Physician/Extender: Ardath Sax Weeks in Treatment: 13 Edema Assessment Assessed: [Left: No] [Right: No] Edema: [Left: N] [Right: o] Vascular Assessment Pulses:  Posterior Tibial Dorsalis Pedis Palpable: [Left:Yes] Extremity colors, hair growth, and conditions: Extremity Color: [Left:Hyperpigmented] Hair Growth on Extremity: [Left:No] Temperature of Extremity: [Left:Warm] Capillary Refill: [Left:< 3 seconds] Electronic Signature(s) Signed: 05/18/2016 4:07:00 PM By: Curtis Sitesorthy, Joanna Entered By: Curtis Sitesorthy, Joanna on 05/18/2016 10:17:24 Jaime MaclachlanBURTON, Kadrian ([id number]) -------------------------------------------------------------------------------- Multi Wound Chart Details Patient Name: Jaime MaclachlanBURTON, Donavyn Date of Service: 05/18/2016 10:00 AM Medical Record Number: [id number] Patient Account Number: [id number] Date of Birth/Sex: 1951-03-18 71(65 y.o. 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] [N/A:N/A] Comorbid History: [2:Chronic Obstructive  Pulmonary Disease (COPD), Hypertension, Myocardial Infarction, Osteoarthritis, Dementia] [N/A:N/A] Date Acquired: [2:01/26/2016] [N/A:N/A] Weeks of Treatment: [2:13] [N/A:N/A] Wound Status: [2:Open] [N/A:N/A] Measurements L x W x D 0.4x0.4x0.1 [N/A:N/A] (cm) Area (cm) : [2:0.126] [N/A:N/A] Volume (cm) : [2:0.013] [N/A:N/A] % Reduction in Area: [2:89.30%] [N/A:N/A] % Reduction in Volume: 94.50% [N/A:N/A] Classification: [2:Full Thickness Without Exposed Support Structures] [N/A:N/A] Exudate Amount: [2:Large] [N/A:N/A] Exudate Type: [2:Serosanguineous] [N/A:N/A] Exudate Color: [2:red, brown] [N/A:N/A] Wound Margin: [2:Distinct, outline attached] [N/A:N/A] Granulation Amount: [2:Large (67-100%)] [N/A:N/A] Granulation Quality: [2:Pink] [N/A:N/A] Necrotic Amount: [2:Small (1-33%)] [N/A:N/A] Exposed Structures: [N/A:N/A] Fascia: No Fat: No Tendon: No Muscle: No Joint: No Bone: No Epithelialization: Small (1-33%) N/A N/A Periwound Skin Texture: Induration: Yes N/A N/A Scarring: Yes Edema: No Excoriation: No Callus: No Crepitus: No Fluctuance: No Friable: No Rash: No Periwound Skin Moist: Yes N/A N/A Moisture: Maceration: No Dry/Scaly: No Periwound Skin Color: Atrophie Blanche: No N/A N/A Cyanosis: No Ecchymosis: No Erythema: No Hemosiderin Staining: No Mottled: No Pallor: No Rubor: No Temperature: No Abnormality N/A N/A Tenderness on Yes N/A N/A Palpation: Wound Preparation: Ulcer Cleansing: N/A N/A Rinsed/Irrigated with Saline Topical Anesthetic Applied: None Treatment Notes Electronic Signature(s) Signed: 05/18/2016 10:20:52 AM By: Curtis Sitesorthy, Joanna Entered By: Curtis Sitesorthy, Joanna on 05/18/2016 10:20:51 Jaime MaclachlanBURTON, Thedford ([id number]) -------------------------------------------------------------------------------- Multi-Disciplinary Care Plan Details Patient Name: Jaime MaclachlanBURTON, Dohn Date of Service: 05/18/2016 10:00 AM Medical Record Number: [id number] Patient Account  Number: [id number] Date of Birth/Sex: 1951-03-18 18(65 y.o. Male) Treating RN: Curtis Sitesorthy, Joanna Primary Care Physician: Evelene CroonNiemeyer, Meindert Other Clinician: Referring Physician: Evelene CroonNiemeyer, Meindert Treating Physician/Extender: Elayne SnarePARKER, PETER Weeks in Treatment: 13 Active Inactive Abuse / Safety / Falls / Self Care Management Nursing Diagnoses: Potential for falls Goals: Patient will remain injury free Date Initiated: 02/11/2016 Goal Status: Active Interventions: Assess fall risk on admission and as needed Assess self care needs on admission and as needed Notes: Nutrition Nursing Diagnoses: Imbalanced nutrition Goals: Patient/caregiver agrees to and verbalizes understanding of need to use nutritional supplements and/or vitamins as prescribed Date Initiated: 02/11/2016 Goal Status: Active Interventions: Assess patient nutrition upon admission and as needed per policy Notes: Orientation to the Wound Care Program Nursing Diagnoses: Knowledge deficit related to the wound healing center program GoalsNada Zuniga: Krinke, Danni ([id number]) Patient/caregiver will verbalize understanding of the Wound Healing Center Program Date Initiated: 02/11/2016 Goal Status: Active Interventions: Provide education on orientation to the wound center Notes: Pain, Acute or Chronic Nursing Diagnoses: Pain, acute or chronic: actual or potential Potential alteration in comfort, pain Goals: Patient will verbalize adequate pain control and receive pain control interventions during procedures as needed Date Initiated: 02/11/2016 Goal Status: Active Patient/caregiver will verbalize adequate  pain control between visits Date Initiated: 02/11/2016 Goal Status: Active Interventions: Assess comfort goal upon admission Complete pain assessment as per visit requirements Notes: Soft Tissue Infection Nursing Diagnoses: Impaired tissue integrity Goals: Patient/caregiver will verbalize understanding of or measures to prevent  infection and contamination in the home setting Date Initiated: 02/11/2016 Goal Status: Active Patient's soft tissue infection will resolve Date Initiated: 02/11/2016 Goal Status: Active Interventions: Assess signs and symptoms of infection every visit SHANNA, STRENGTH ([id number]) Notes: Wound/Skin Impairment Nursing Diagnoses: Impaired tissue integrity Knowledge deficit related to smoking impact on wound healing Goals: Patient will demonstrate a reduced rate of smoking or cessation of smoking Date Initiated: 02/11/2016 Goal Status: Active Ulcer/skin breakdown will have a volume reduction of 30% by week 4 Date Initiated: 02/11/2016 Goal Status: Active Ulcer/skin breakdown will have a volume reduction of 50% by week 8 Date Initiated: 02/11/2016 Goal Status: Active Ulcer/skin breakdown will have a volume reduction of 80% by week 12 Date Initiated: 02/11/2016 Goal Status: Active Interventions: Assess ulceration(s) every visit Notes: Electronic Signature(s) Signed: 05/18/2016 10:20:45 AM By: Curtis Sites Entered By: Curtis Sites on 05/18/2016 10:20:44 Jaime Zuniga ([id number]) -------------------------------------------------------------------------------- Pain Assessment Details Patient Name: Jaime Zuniga Date of Service: 05/18/2016 10:00 AM Medical Record Number: [id number] Patient Account Number: [id number] Date of Birth/Sex: Jan 12, 1951 (65 y.o. Male) Treating RN: Curtis Sites Primary Care Physician: Evelene Croon Other Clinician: Referring Physician: Evelene Croon Treating Physician/Extender: Elayne Snare in Treatment: 13 Active Problems Location of Pain Severity and Description of Pain Patient Has Paino No Site Locations Pain Management and Medication Current Pain Management: Notes Topical or injectable lidocaine is offered to patient for acute pain when surgical debridement is performed. If needed, Patient is instructed to use over the counter pain  medication for the following 24-48 hours after debridement. Wound care MDs do not prescribed pain medications. Patient has chronic pain or uncontrolled pain. Patient has been instructed to make an appointment with their Primary Care Physician for pain management. Electronic Signature(s) Signed: 05/18/2016 4:07:00 PM By: Curtis Sites Entered By: Curtis Sites on 05/18/2016 10:11:21 Jaime Zuniga ([id number]) -------------------------------------------------------------------------------- Patient/Caregiver Education Details Patient Name: Jaime Zuniga Date of Service: 05/18/2016 10:00 AM Medical Record Number: [id number] Patient Account Number: [id number] Date of Birth/Gender: 1951-05-23 (65 y.o. Male) Treating RN: Curtis Sites Primary Care Physician: Evelene Croon Other Clinician: Referring Physician: Evelene Croon Treating Physician/Extender: Elayne Snare in Treatment: 13 Education Assessment Education Provided To: Caregiver HHRN Education Topics Provided Wound/Skin Impairment: Handouts: Other: wound care to be performed as ordered - do not wrap leg - via phone call Methods: Explain/Verbal Responses: State content correctly Electronic Signature(s) Signed: 05/18/2016 3:41:47 PM By: Ardath Sax MD Entered By: Ardath Sax on 05/18/2016 11:11:31 Jaime Zuniga ([id number]) -------------------------------------------------------------------------------- Wound Assessment Details Patient Name: Jaime Zuniga Date of Service: 05/18/2016 10:00 AM Medical Record Number: [id number] Patient Account Number: [id number] Date of Birth/Sex: 09-24-1950 (65 y.o. Male) Treating RN: Curtis Sites Primary Care Physician: Evelene Croon Other Clinician: Referring Physician: Evelene Croon Treating Physician/Extender: Elayne Snare in Treatment: 13 Wound Status Wound Number: 2 Primary Venous Leg Ulcer Etiology: Wound Location: Left Lower Leg -  Anterior, Distal Wound Open Status: Wounding Event: Gradually Appeared Comorbid Chronic Obstructive Pulmonary Disease Date Acquired: 01/26/2016 History: (COPD), Hypertension, Myocardial Weeks Of Treatment: 13 Infarction, Osteoarthritis, Dementia Clustered Wound: No Photos Wound Measurements Length: (cm) 0.4 Width: (cm) 0.4 Depth: (cm) 0.1 Area: (cm) 0.126 Volume: (cm) 0.013 % Reduction in Area: 89.3% % Reduction in Volume: 94.5% Epithelialization: Small (1-33%) Tunneling: No Undermining: No  Wound Description Full Thickness Without Exposed Classification: Support Structures Wound Margin: Distinct, outline attached Exudate Large Amount: Exudate Type: Serosanguineous Exudate Color: red, brown Foul Odor After Cleansing: No Wound Bed Granulation Amount: Large (67-100%) Exposed Structure Granulation Quality: Pink Fascia Exposed: No Necrotic Amount: Small (1-33%) Fat Layer Exposed: No Edgington, Janet ([id number]) Necrotic Quality: Adherent Slough Tendon Exposed: No Muscle Exposed: No Joint Exposed: No Bone Exposed: No Periwound Skin Texture Texture Color No Abnormalities Noted: No No Abnormalities Noted: No Callus: No Atrophie Blanche: No Crepitus: No Cyanosis: No Excoriation: No Ecchymosis: No Fluctuance: No Erythema: No Friable: No Hemosiderin Staining: No Induration: Yes Mottled: No Localized Edema: No Pallor: No Rash: No Rubor: No Scarring: Yes Temperature / Pain Moisture Temperature: No Abnormality No Abnormalities Noted: No Tenderness on Palpation: Yes Dry / Scaly: No Maceration: No Moist: Yes Wound Preparation Ulcer Cleansing: Rinsed/Irrigated with Saline Topical Anesthetic Applied: None Treatment Notes Wound #2 (Left, Distal, Anterior Lower Leg) 1. Cleansed with: Clean wound with Normal Saline 4. Dressing Applied: Prisma Ag 5. Secondary Dressing Applied Bordered Foam Dressing Notes netting Electronic Signature(s) Signed:  05/18/2016 4:07:00 PM By: Curtis Sites Entered By: Curtis Sites on 05/18/2016 10:37:18 Jaime Zuniga ([id number]) -------------------------------------------------------------------------------- Vitals Details Patient Name: Jaime Zuniga Date of Service: 05/18/2016 10:00 AM Medical Record Number: [id number] Patient Account Number: [id number] Date of Birth/Sex: 08-13-1950 (65 y.o. Male) Treating RN: Curtis Sites Primary Care Physician: Evelene Croon Other Clinician: Referring Physician: Evelene Croon Treating Physician/Extender: Elayne Snare in Treatment: 13 Vital Signs Time Taken: 10:12 Temperature (F): 97.7 Height (in): 72 Pulse (bpm): 67 Weight (lbs): 210 Respiratory Rate (breaths/min): 16 Body Mass Index (BMI): 28.5 Blood Pressure (mmHg): 108/70 Reference Range: 80 - 120 mg / dl Electronic Signature(s) Signed: 05/18/2016 4:07:00 PM By: Curtis Sites Entered By: Curtis Sites on 05/18/2016 10:14:07

## 2016-05-19 NOTE — Progress Notes (Signed)
Jaime Zuniga, Jaime Zuniga ([id number]) Visit Report for 05/18/2016 Chief Complaint Document Details Patient Name: Jaime Zuniga, Jaime Zuniga Date of Service: 05/18/2016 10:00 AM Medical Record Number: [id number] Patient Account Number: [id number] Date of Birth/Sex: 11/06/1950 (65 y.o. 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 Male) Treating RN: Curtis Sites Primary Care Physician: Evelene Croon Other Clinician: Referring Physician: Evelene Croon Treating Physician/Extender: Elayne Snare in Treatment: 13 History of Present Illness Location: left lower extremity anterior shin area Quality: Patient reports experiencing a dull pain to affected area(s). Severity: Patient states wound are getting worse. Duration: Patient has had the wound for > 3 months prior to seeking treatment at the wound center Timing: Pain in wound is Intermittent (comes and goes Context: The wound appeared gradually over time Modifying Factors: Other treatment(s) tried include:Augmentin and doxycycline Associated Signs and Symptoms: Patient reports having increase swelling. HPI Description: 65 year old gentleman was being seen in the ER recently  for wounds on his left lower extremity which have been draining and there is swelling and he was concerned about wound infection. in the ER x-ray of the left leg was done which showed postsurgical changes but no acute findings.he wrapped his leg in and Unna's boots and referred him to the wound center. earlier in June he had a DVT study done of the left lower extremity which showed no evidence of DVT but there was edema seen. no reflux was seen on that study. in June he was seen by a surgeon Dr. Lemar Livings who advised symptomatic treatment. the patient was also seen at the [facility] he has recently completed courses of Augmentin and doxycycline. 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 02/24/2016 -- seen by Dr. Kirke Corin on 02/17/2016. Lower extremity arterial studies were done which showed a normal ABI bilaterally and normal great toe pressures. Duplex showed diffuse nonobstructive disease and a three-vessel runoff bilaterally. No further vascular studies or intervention was recommended. He was seen by Dr. Romilda Joy on 02/17/2016 and recommended to continue with wound care and no vascular studies intervention was recommended. Electronic Signature(s) Signed: 05/18/2016 3:41:47 PM By: Ardath Sax MD Entered By: Ardath Sax on 05/18/2016 11:08:12 Jaime Zuniga ([id number]) -------------------------------------------------------------------------------- Physical Exam Details Patient Name: Jaime Zuniga Date of Service: 05/18/2016 10:00 AM Medical Record Number: [id number] Patient Account Number: [id number] Date of Birth/Sex: 1951-05-22 (65 y.o. Male) Treating RN: Curtis Sites Primary Care Physician: Evelene Croon Other Clinician: Referring Physician: Evelene Croon Treating Physician/Extender: Elayne Snare in Treatment: 13 Electronic  Signature(s) Signed: 05/18/2016 3:41:47 PM By: Ardath Sax MD Entered By: Ardath Sax on 05/18/2016 11:08:19 Jaime Zuniga ([id number]) -------------------------------------------------------------------------------- Physician Orders Details Patient Name: Jaime Zuniga Date of Service: 05/18/2016 10:00 AM Medical Record Number: [id number] Patient Account Number: [id number] Date of Birth/Sex: [birth year] (65 y.o. Male) Treating RN: Curtis Sites Primary Care Physician: Evelene Croon Other  Clinician: Referring Physician: Evelene CroonNiemeyer, Meindert Treating Physician/Extender: Elayne SnarePARKER, Jamese Trauger Weeks in Treatment: 2713 Verbal / Phone Orders: Yes Clinician: Curtis Sitesorthy, Joanna Read Back and Verified: Yes Diagnosis Coding Wound Cleansing Wound #2 Left,Distal,Anterior Lower Leg o Clean wound with Normal Saline. o Cleanse wound with mild soap and water o May Shower, gently pat wound dry prior to applying new dressing. Anesthetic Wound #2 Left,Distal,Anterior Lower Leg o Topical Lidocaine 4% cream applied to wound bed prior to debridement - for clinic use Skin Barriers/Peri-Wound Care Wound #2 Left,Distal,Anterior Lower Leg o Barrier cream Primary Wound Dressing Wound #2 Left,Distal,Anterior Lower Leg o Prisma Ag Secondary Dressing Wound #2 Left,Distal,Anterior Lower Leg o Boardered Foam Dressing - dry gauze Dressing Change Frequency Wound #2 Left,Distal,Anterior Lower Leg o Change dressing every other day. Follow-up Appointments Wound #2 Left,Distal,Anterior Lower Leg o Return Appointment in 1 week. Edema Control Wound #2 Left,Distal,Anterior Lower Leg o Patient to wear own compression stockings - DO NOT WRAP PATIENT'S LEG o Elevate legs to the level of the heart and pump ankles as often as possible Jaime Zuniga, Jaime Zuniga ([id number]) o Support Garment 20-30 mm/Hg pressure to: - HOME HEALTH TO PLEASE ORDER STOCKINGS FOR PATIENT. Additional Orders / Instructions Wound #2  Left,Distal,Anterior Lower Leg o Stop Smoking o Increase protein intake. Home Health Wound #2 Left,Distal,Anterior Lower Leg o Continue Home Health Visits - [facility] - DO NOT WRAP PATIENT'S LEG o Home Health Nurse may visit PRN to address patientos wound care needs. - HOME HEALTH TO PLEASE ORDER COMPRESSION STOCKINGS o FACE TO FACE ENCOUNTER: MEDICARE and MEDICAID PATIENTS: I certify that this patient is under my care and that I had a face-to-face encounter that meets the physician face-to-face encounter requirements with this patient on this date. The encounter with the patient was in whole or in part for the following MEDICAL CONDITION: (primary reason for Home Healthcare) MEDICAL NECESSITY: I certify, that based on my findings, NURSING services are a medically necessary home health service. HOME BOUND STATUS: I certify that my clinical findings support that this patient is homebound (i.e., Due to illness or injury, pt requires aid of supportive devices such as crutches, cane, wheelchairs, walkers, the use of special transportation or the assistance of another person to leave their place of residence. There is a normal inability to leave the home and doing so requires considerable and taxing effort. Other absences are for medical reasons / religious services and are infrequent or of short duration when for other reasons). o If current dressing causes regression in wound condition, may D/C ordered dressing product/s and apply Normal Saline Moist Dressing daily until next Wound Healing Center / Other MD appointment. Notify Wound Healing Center of regression in wound condition at [phone]. o Please direct any NON-WOUND related issues/requests for orders to patient's Primary Care Physician Electronic Signature(s) Signed: 05/18/2016 3:41:47 PM By: Ardath SaxParker, Jacklyn Branan MD Signed: 05/18/2016 4:07:00 PM By: Curtis Sitesorthy, Joanna Entered By: Curtis Sitesorthy, Joanna on 05/18/2016 10:39:23 Jaime MaclachlanBURTON,  Jaime Zuniga ([id number]) -------------------------------------------------------------------------------- Problem List Details Patient Name: Jaime MaclachlanBURTON, Randale Date of Service: 05/18/2016 10:00 AM Medical Record Number: [id number] Patient Account Number: [id number] Date of Birth/Sex: 1951-03-30 (65 y.o. 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 M61.462 Other calcification of muscle, left lower leg 02/11/2016 Yes I73.9 Peripheral vascular disease, unspecified 02/11/2016 Yes I89.0 Lymphedema, not  elsewhere classified 04/20/2016 Yes Inactive Problems Resolved Problems Electronic Signature(s) Signed: 05/18/2016 3:41:47 PM By: Ardath Sax MD Entered By: Ardath Sax on 05/18/2016 11:07:55 Jaime Zuniga ([id number]) -------------------------------------------------------------------------------- Progress Note Details Patient Name: Jaime Zuniga Date of Service: 05/18/2016 10:00 AM Medical Record Number: [id number] Patient Account Number: [id number] Date of Birth/Sex: 07-27-1951 (65 y.o. 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 Severity: Patient states wound are getting worse. Duration: Patient has had the wound for > 3 months prior to seeking treatment at the wound center Timing: Pain in wound is Intermittent (comes and goes Context: The wound appeared gradually over time Modifying Factors: Other treatment(s) tried include:Augmentin and doxycycline Associated Signs and Symptoms: Patient reports having increase swelling. 65 year old gentleman was being seen in the ER recently for wounds on his left lower extremity which have been draining and there is swelling and he was concerned about wound infection. in the ER x-ray of the left leg was done which showed postsurgical changes but no acute findings.he wrapped his leg in and Unna's boots and referred him to the wound center. earlier in June he had a DVT study done of the left lower extremity which showed no evidence of DVT but there was edema seen. no reflux was seen on that study. in June he was seen by a surgeon Dr. Lemar Livings who advised symptomatic treatment. the patient was also seen at the [facility] he has recently completed courses of Augmentin and doxycycline. 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 02/24/2016 -- seen by Dr. Kirke Corin on 02/17/2016. Lower extremity arterial studies were done which showed a normal ABI bilaterally and normal great toe pressures. Duplex showed diffuse nonobstructive disease and a three-vessel runoff bilaterally. No further vascular studies or intervention was recommended. He was seen by Dr. Romilda Joy on 02/17/2016 and recommended to continue with wound care and no vascular studies intervention was recommended. [place], Irene Limbo ([id number]) Objective Constitutional Vitals Time Taken: 10:12 AM, Height: 72 in, Weight: 210 lbs, BMI: 28.5, Temperature: 97.7 F, Pulse:  67 bpm, Respiratory Rate: 16 breaths/min, Blood Pressure: 108/70 mmHg. Integumentary (Hair, Skin) Wound #2 status is Open. Original cause of wound was Gradually Appeared. The wound is located on the [facility] Lower Leg. The wound measures 0.4cm length x 0.4cm width x 0.1cm depth; 0.126cm^2 area and 0.013cm^3 volume. There is no tunneling or undermining noted. There is a large amount of serosanguineous drainage noted. The wound margin is distinct with the outline attached to the wound base. There is large (67-100%) pink granulation within the wound bed. There is a small (1-33%) amount of necrotic tissue within the wound bed including Adherent Slough. The periwound skin appearance exhibited: Induration, Scarring, Moist. The periwound skin appearance did not exhibit: Callus, Crepitus, Excoriation, Fluctuance, Friable, Localized Edema, Rash, Dry/Scaly, Maceration, Atrophie Blanche, Cyanosis, Ecchymosis, Hemosiderin Staining, Mottled, Pallor, Rubor, Erythema. Periwound temperature was noted as No Abnormality. The periwound  has tenderness on palpation. Small venous ulcer anterior left leg 7 mm. Has old Fx site at that spot. Rx collagen daily. Assessment Active Problems ICD-10 L97.222 - Non-pressure chronic ulcer of left calf with fat layer exposed M61.462 - Other calcification of muscle, left lower leg I73.9 - Peripheral vascular disease, unspecified I89.0 - Lymphedema, not elsewhere classified Plan Wound Cleansing: Wound #2 Left,Distal,Anterior Lower Leg: Clean wound with Normal Saline. Cleanse wound with mild soap and water May Shower, gently pat wound dry prior to applying new dressing. AnestheticMAHLIK, Jaime Zuniga ([id number]) Wound #2 Left,Distal,Anterior Lower Leg: Topical Lidocaine 4% cream applied to wound bed prior to debridement - for clinic use Skin Barriers/Peri-Wound Care: Wound #2 Left,Distal,Anterior Lower Leg: Barrier cream Primary Wound Dressing: Wound #2  Left,Distal,Anterior Lower Leg: Prisma Ag Secondary Dressing: Wound #2 Left,Distal,Anterior Lower Leg: Boardered Foam Dressing - dry gauze Dressing Change Frequency: Wound #2 Left,Distal,Anterior Lower Leg: Change dressing every other day. Follow-up Appointments: Wound #2 Left,Distal,Anterior Lower Leg: Return Appointment in 1 week. Edema Control: Wound #2 Left,Distal,Anterior Lower Leg: Patient to wear own compression stockings - DO NOT WRAP PATIENT'S LEG Elevate legs to the level of the heart and pump ankles as often as possible Support Garment 20-30 mm/Hg pressure to: - HOME HEALTH TO PLEASE ORDER STOCKINGS FOR PATIENT. Additional Orders / Instructions: Wound #2 Left,Distal,Anterior Lower Leg: Stop Smoking Increase protein intake. Home Health: Wound #2 Left,Distal,Anterior Lower Leg: Continue Home Health Visits - [facility] - DO NOT WRAP PATIENT'S LEG Home Health Nurse may visit PRN to address patient s wound care needs. - HOME HEALTH TO PLEASE ORDER COMPRESSION STOCKINGS FACE TO FACE ENCOUNTER: MEDICARE and MEDICAID PATIENTS: I certify that this patient is under my care and that I had a face-to-face encounter that meets the physician face-to-face encounter requirements with this patient on this date. The encounter with the patient was in whole or in part for the following MEDICAL CONDITION: (primary reason for Home Healthcare) MEDICAL NECESSITY: I certify, that based on my findings, NURSING services are a medically necessary home health service. HOME BOUND STATUS: I certify that my clinical findings support that this patient is homebound (i.e., Due to illness or injury, pt requires aid of supportive devices such as crutches, cane, wheelchairs, walkers, the use of special transportation or the assistance of another person to leave their place of residence. There is a normal inability to leave the home and doing so requires considerable and taxing effort. Other absences are for  medical reasons / religious services and are infrequent or of short duration when for other reasons). If current dressing causes regression in wound condition, may D/C ordered dressing product/s and apply Normal Saline Moist Dressing daily until next Wound Healing Center / Other MD appointment. Notify Wound Healing Center of regression in wound condition at [phone]. Please direct any NON-WOUND related issues/requests for orders to patient's Primary Care Physician Follow-Up Appointments: Jaime Zuniga, Jaime Zuniga ([id number]) A follow-up appointment should be scheduled. A Patient Clinical Summary of Care was provided to [facility] Electronic Signature(s) Signed: 05/18/2016 3:41:47 PM By: Ardath Sax MD Entered By: Ardath Sax on 05/18/2016 11:10:21 Jaime Zuniga ([id number]) -------------------------------------------------------------------------------- SuperBill Details Patient Name: Jaime Zuniga Date of Service: 05/18/2016 Medical Record Number: [id number] Patient Account Number: [id number] Date of Birth/Sex: [birth year] (65 y.o. Male) Treating RN: Curtis Sites Primary Care Physician: Evelene Croon Other Clinician: Referring Physician: Evelene Croon Treating Physician/Extender: Elayne Snare in Treatment: 13 Diagnosis Coding ICD-10 Codes Code Description [id number] Non-pressure chronic ulcer of left calf with fat layer exposed  Z61.096 Other calcification of muscle, left lower leg I73.9 Peripheral vascular disease, unspecified I89.0 Lymphedema, not elsewhere classified Facility Procedures CPT4 Code: 04540981 Description: 99213 - WOUND CARE VISIT-LEV 3 EST PT Modifier: Quantity: 1 Physician Procedures CPT4 Code: 1914782 Description: [id number] - WC PHYS LEVEL 2 - EST PT ICD-10 Description Diagnosis L97.222 Non-pressure chronic ulcer of left calf with fat I89.0 Lymphedema, not elsewhere classified Modifier: layer exposed Quantity: 1 Electronic Signature(s) Signed: 05/18/2016  12:42:06 PM By: Curtis Sites Signed: 05/18/2016 3:41:47 PM By: Ardath Sax MD Entered By: Curtis Sites on 05/18/2016 12:42:06

## 2016-05-25 ENCOUNTER — Encounter: Payer: Medicare Other | Admitting: Surgery

## 2016-05-25 DIAGNOSIS — L97222 Non-pressure chronic ulcer of left calf with fat layer exposed: Secondary | ICD-10-CM | POA: Diagnosis not present

## 2016-05-26 NOTE — Progress Notes (Signed)
ALPHONZA, TRAMELL ([id number]) Visit Report for 05/25/2016 Arrival Information Details Patient Name: Jaime Zuniga, Jaime Zuniga Date of Service: 05/25/2016 8:45 AM Medical Record Number: [id number] Patient Account Number: [id number] Date of Birth/Sex: March 24, 1951 (65 y.o. Male) Treating RN: Phillis Haggis Primary Care Physician: Evelene Croon Other Clinician: Referring Physician: Evelene Croon Treating Physician/Extender: Rudene Re in Treatment: 14 Visit Information History Since Last Visit All ordered tests and consults were completed: No Patient Arrived: Dan Humphreys Added or deleted any medications: No Arrival Time: 09:16 Any new allergies or adverse reactions: No Accompanied By: caregiver Had a fall or experienced change in No Transfer Assistance: None activities of daily living that may affect Patient Identification Verified: Yes risk of falls: Secondary Verification Process Yes Signs or symptoms of abuse/neglect since last No Completed: visito Patient Requires Transmission-Based No Hospitalized since last visit: No Precautions: Pain Present Now: No Patient Has Alerts: No Electronic Signature(s) Signed: 05/25/2016 5:28:24 PM By: Alejandro Mulling Entered By: Alejandro Mulling on 05/25/2016 09:16:47 [name] ([id number]) -------------------------------------------------------------------------------- Clinic Level of Care Assessment Details Patient Name: [name] Date of Service: 05/25/2016 8:45 AM Medical Record Number: [id number] Patient Account Number: [id number] Date of Birth/Sex: [birth year] (65 y.o. Male) Treating RN: Clover Mealy, RN, BSN, Rita Primary Care Physician: Evelene Croon Other Clinician: Referring Physician: Evelene Croon Treating Physician/Extender: Rudene Re in Treatment: 14 Clinic Level of Care Assessment Items TOOL 4 Quantity Score []  - Use when only an EandM is performed on FOLLOW-UP visit 0 ASSESSMENTS - Nursing Assessment /  Reassessment X - Reassessment of Co-morbidities (includes updates in patient status) 1 10 X - Reassessment of Adherence to Treatment Plan 1 5 ASSESSMENTS - Wound and Skin Assessment / Reassessment X - Simple Wound Assessment / Reassessment - one wound 1 5 []  - Complex Wound Assessment / Reassessment - multiple wounds 0 []  - Dermatologic / Skin Assessment (not related to wound area) 0 ASSESSMENTS - Focused Assessment []  - Circumferential Edema Measurements - multi extremities 0 []  - Nutritional Assessment / Counseling / Intervention 0 X - Lower Extremity Assessment (monofilament, tuning fork, pulses) 1 5 []  - Peripheral Arterial Disease Assessment (using hand held doppler) 0 ASSESSMENTS - Ostomy and/or Continence Assessment and Care []  - Incontinence Assessment and Management 0 []  - Ostomy Care Assessment and Management (repouching, etc.) 0 PROCESS - Coordination of Care X - Simple Patient / Family Education for ongoing care 1 15 []  - Complex (extensive) Patient / Family Education for ongoing care 0 []  - Staff obtains Chiropractor, Records, Test Results / Process Orders 0 X - Staff telephones HHA, Nursing Homes / Clarify orders / etc 1 10 []  - Routine Transfer to another Facility (non-emergent condition) 0 Mcadam, Anthem ([id number]) []  - Routine Hospital Admission (non-emergent condition) 0 []  - New Admissions / Manufacturing engineer / Ordering NPWT, Apligraf, etc. 0 []  - Emergency Hospital Admission (emergent condition) 0 []  - Simple Discharge Coordination 0 []  - Complex (extensive) Discharge Coordination 0 PROCESS - Special Needs []  - Pediatric / Minor Patient Management 0 []  - Isolation Patient Management 0 []  - Hearing / Language / Visual special needs 0 []  - Assessment of Community assistance (transportation, D/C planning, etc.) 0 []  - Additional assistance / Altered mentation 0 []  - Support Surface(s) Assessment (bed, cushion, seat, etc.) 0 INTERVENTIONS - Wound Cleansing /  Measurement X - Simple Wound Cleansing - one wound 1 5 []  - Complex Wound Cleansing - multiple wounds 0 X - Wound Imaging (photographs - any number of wounds) 1 5 []  - Wound Tracing (  instead of photographs) 0 X - Simple Wound Measurement - one wound 1 5 []  - Complex Wound Measurement - multiple wounds 0 INTERVENTIONS - Wound Dressings X - Small Wound Dressing one or multiple wounds 1 10 []  - Medium Wound Dressing one or multiple wounds 0 []  - Large Wound Dressing one or multiple wounds 0 []  - Application of Medications - topical 0 []  - Application of Medications - injection 0 INTERVENTIONS - Miscellaneous []  - External ear exam 0 Traxler, Dixon ([id number]) []  - Specimen Collection (cultures, biopsies, blood, body fluids, etc.) 0 []  - Specimen(s) / Culture(s) sent or taken to Lab for analysis 0 []  - Patient Transfer (multiple staff / Michiel SitesHoyer Lift / Similar devices) 0 []  - Simple Staple / Suture removal (25 or less) 0 []  - Complex Staple / Suture removal (26 or more) 0 []  - Hypo / Hyperglycemic Management (close monitor of Blood Glucose) 0 []  - Ankle / Brachial Index (ABI) - do not check if billed separately 0 X - Vital Signs 1 5 Has the patient been seen at the hospital within the last three years: Yes Total Score: 80 Level Of Care: New/Established - Level 3 Electronic Signature(s) Signed: 05/25/2016 5:42:32 PM By: Elpidio EricAfful, Rita BSN, RN Entered By: Elpidio EricAfful, Rita on 05/25/2016 09:28:42 Nada MaclachlanBURTON, Lovie ([id number]) -------------------------------------------------------------------------------- Encounter Discharge Information Details Patient Name: Nada MaclachlanBURTON, Naod Date of Service: 05/25/2016 8:45 AM Medical Record Number: [id number] Patient Account Number: [id number] Date of Birth/Sex: 1950/09/25 60(65 y.o. Male) Treating RN: Phillis HaggisPinkerton, Debi Primary Care Physician: Evelene CroonNiemeyer, Meindert Other Clinician: Referring Physician: Evelene CroonNiemeyer, Meindert Treating Physician/Extender: Rudene ReBritto, Errol Weeks in  Treatment: 14 Encounter Discharge Information Items Discharge Pain Level: 0 Discharge Condition: Stable Ambulatory Status: Walker Discharge Destination: Nursing Home Transportation: Other Accompanied By: caregiver Schedule Follow-up Appointment: Yes Medication Reconciliation completed and provided to Patient/Care Yes Daneille Desilva: Provided on Clinical Summary of Care: 05/25/2016 Form Type Recipient Paper Patient SB Electronic Signature(s) Signed: 05/25/2016 9:36:51 AM By: Gwenlyn PerkingMoore, Shelia Entered By: Gwenlyn PerkingMoore, Shelia on 05/25/2016 09:36:51 Nada MaclachlanBURTON, Pravin ([id number]) -------------------------------------------------------------------------------- Lower Extremity Assessment Details Patient Name: Nada MaclachlanBURTON, Mccauley Date of Service: 05/25/2016 8:45 AM Medical Record Number: [id number] Patient Account Number: [id number] Date of Birth/Sex: 1950/09/25 (65 y.o. Male) Treating RN: Phillis HaggisPinkerton, Debi Primary Care Physician: Evelene CroonNiemeyer, Meindert Other Clinician: Referring Physician: Evelene CroonNiemeyer, Meindert Treating Physician/Extender: Rudene ReBritto, Errol Weeks in Treatment: 14 Vascular Assessment Pulses: Posterior Tibial Dorsalis Pedis Palpable: [Left:Yes] Extremity colors, hair growth, and conditions: Extremity Color: [Left:Hyperpigmented] Temperature of Extremity: [Left:Warm] Toe Nail Assessment Left: Right: Thick: Yes Discolored: Yes Deformed: No Improper Length and Hygiene: Yes Electronic Signature(s) Signed: 05/25/2016 5:28:24 PM By: Alejandro MullingPinkerton, Debra Entered By: Alejandro MullingPinkerton, Debra on 05/25/2016 09:24:14 Nada MaclachlanBURTON, Marquice ([id number]) -------------------------------------------------------------------------------- Multi Wound Chart Details Patient Name: Nada MaclachlanBURTON, Matheo Date of Service: 05/25/2016 8:45 AM Medical Record Number: [id number] Patient Account Number: [id number] Date of Birth/Sex: 1950/09/25 50(65 y.o. Male) Treating RN: Clover MealyAfful, RN, BSN, Rita Primary Care Physician: Evelene CroonNiemeyer, Meindert Other  Clinician: Referring Physician: Evelene CroonNiemeyer, Meindert Treating Physician/Extender: Rudene ReBritto, Errol Weeks in Treatment: 14 Vital Signs Height(in): 72 Pulse(bpm): 68 Weight(lbs): 210 Blood Pressure 102/56 (mmHg): Body Mass Index(BMI): 28 Temperature(F): 97.7 Respiratory Rate 16 (breaths/min): Photos: [2:No Photos] [N/A:N/A] Wound Location: [2:Left, Distal, Anterior Lower N/A Leg] Wounding Event: [2:Gradually Appeared] [N/A:N/A] Primary Etiology: [2:Venous Leg Ulcer] [N/A:N/A] Date Acquired: [2:01/26/2016] [N/A:N/A] Weeks of Treatment: [2:14] [N/A:N/A] Wound Status: [2:Open] [N/A:N/A] Measurements L x W x D 0.4x0.3x0.1 [N/A:N/A] (cm) Area (cm) : [2:0.094] [N/A:N/A] Volume (cm) : [2:0.009] [N/A:N/A] % Reduction in Area: [2:92.00%] [N/A:N/A] % Reduction in Volume: 96.20% [N/A:N/A]  Classification: [2:Full Thickness Without Exposed Support Structures] [N/A:N/A] Periwound Skin Texture: No Abnormalities Noted N/A Periwound Skin [2:No Abnormalities Noted N/A] Moisture: Periwound Skin Color: No Abnormalities Noted N/A Tenderness on [2:No] [N/A:N/A] Treatment Notes Electronic Signature(s) Signed: 05/25/2016 5:42:32 PM By: Elpidio Eric BSN, RN [place], Harbert ([id number]) Entered By: Elpidio Eric on 05/25/2016 09:27:45 [name] ([id number]) -------------------------------------------------------------------------------- Multi-Disciplinary Care Plan Details Patient Name: [name] Date of Service: 05/25/2016 8:45 AM Medical Record Number: [id number] Patient Account Number: [id number] Date of Birth/Sex: 11-Jul-1951 (65 y.o. Male) Treating RN: Clover Mealy, RN, BSN,  Sink Primary Care Physician: Evelene Croon Other Clinician: Referring Physician: Evelene Croon Treating Physician/Extender: Rudene Re in Treatment: 14 Active Inactive Abuse / Safety / Falls / Self Care Management Nursing Diagnoses: Potential for falls Goals: Patient will remain injury free Date  Initiated: 02/11/2016 Goal Status: Active Interventions: Assess fall risk on admission and as needed Assess self care needs on admission and as needed Notes: Nutrition Nursing Diagnoses: Imbalanced nutrition Goals: Patient/caregiver agrees to and verbalizes understanding of need to use nutritional supplements and/or vitamins as prescribed Date Initiated: 02/11/2016 Goal Status: Active Interventions: Assess patient nutrition upon admission and as needed per policy Notes: Orientation to the Wound Care Program Nursing Diagnoses: Knowledge deficit related to the wound healing center program GoalsVIYAN, ROSAMOND ([id number]) Patient/caregiver will verbalize understanding of the Wound Healing Center Program Date Initiated: 02/11/2016 Goal Status: Active Interventions: Provide education on orientation to the wound center Notes: Pain, Acute or Chronic Nursing Diagnoses: Pain, acute or chronic: actual or potential Potential alteration in comfort, pain Goals: Patient will verbalize adequate pain control and receive pain control interventions during procedures as needed Date Initiated: 02/11/2016 Goal Status: Active Patient/caregiver will verbalize adequate pain control between visits Date Initiated: 02/11/2016 Goal Status: Active Interventions: Assess comfort goal upon admission Complete pain assessment as per visit requirements Notes: Soft Tissue Infection Nursing Diagnoses: Impaired tissue integrity Goals: Patient/caregiver will verbalize understanding of or measures to prevent infection and contamination in the home setting Date Initiated: 02/11/2016 Goal Status: Active Patient's soft tissue infection will resolve Date Initiated: 02/11/2016 Goal Status: Active Interventions: Assess signs and symptoms of infection every visit ARIE, POWELL ([id number]) Notes: Wound/Skin Impairment Nursing Diagnoses: Impaired tissue integrity Knowledge deficit related to smoking impact on wound  healing Goals: Patient will demonstrate a reduced rate of smoking or cessation of smoking Date Initiated: 02/11/2016 Goal Status: Active Ulcer/skin breakdown will have a volume reduction of 30% by week 4 Date Initiated: 02/11/2016 Goal Status: Active Ulcer/skin breakdown will have a volume reduction of 50% by week 8 Date Initiated: 02/11/2016 Goal Status: Active Ulcer/skin breakdown will have a volume reduction of 80% by week 12 Date Initiated: 02/11/2016 Goal Status: Active Interventions: Assess ulceration(s) every visit Notes: Electronic Signature(s) Signed: 05/25/2016 5:42:32 PM By: Elpidio Eric BSN, RN Entered By: Elpidio Eric on 05/25/2016 09:27:38 [name] ([id number]) -------------------------------------------------------------------------------- Pain Assessment Details Patient Name: [name] Date of Service: 05/25/2016 8:45 AM Medical Record Number: [id number] Patient Account Number: [id number] Date of Birth/Sex: Dec 01, 1950 (65 y.o. Male) Treating RN: Phillis Haggis Primary Care Physician: Evelene Croon Other Clinician: Referring Physician: Evelene Croon Treating Physician/Extender: Rudene Re in Treatment: 14 Active Problems Location of Pain Severity and Description of Pain Patient Has Paino No Site Locations With Dressing Change: No Pain Management and Medication Current Pain Management: Electronic Signature(s) Signed: 05/25/2016 5:28:24 PM By: Alejandro Mulling Entered By: Alejandro Mulling on 05/25/2016 09:16:53 [name] ([id number]) -------------------------------------------------------------------------------- Patient/Caregiver Education Details Patient Name: [name] Date of Service: 05/25/2016  8:45 AM Medical Record Number: [id number] Patient Account Number: [id number] Date of Birth/Gender: 1950/08/08 (65 y.o. Male) Treating RN: Phillis Haggis Primary Care Physician: Evelene Croon Other Clinician: Referring Physician:  Evelene Croon Treating Physician/Extender: Rudene Re in Treatment: 14 Education Assessment Education Provided To: Patient Education Topics Provided Wound/Skin Impairment: Handouts: Other: change dressing as ordered Methods: Demonstration, Explain/Verbal Responses: State content correctly Electronic Signature(s) Signed: 05/25/2016 5:28:24 PM By: Alejandro Mulling Entered By: Alejandro Mulling on 05/25/2016 09:26:13 [name] ([id number]) -------------------------------------------------------------------------------- Wound Assessment Details Patient Name: [name] Date of Service: 05/25/2016 8:45 AM Medical Record Number: [id number] Patient Account Number: [id number] Date of Birth/Sex: 03-07-1951 (65 y.o. Male) Treating RN: Phillis Haggis Primary Care Physician: Evelene Croon Other Clinician: Referring Physician: Evelene Croon Treating Physician/Extender: Rudene Re in Treatment: 14 Wound Status Wound Number: 2 Primary Etiology: Venous Leg Ulcer Wound Location: Left, Distal, Anterior Lower Leg Wound Status: Open Wounding Event: Gradually Appeared Date Acquired: 01/26/2016 Weeks Of Treatment: 14 Clustered Wound: No Photos Photo Uploaded By: Alejandro Mulling on 05/25/2016 10:38:14 Wound Measurements Length: (cm) 0.4 Width: (cm) 0.3 Depth: (cm) 0.1 Area: (cm) 0.094 Volume: (cm) 0.009 % Reduction in Area: 92% % Reduction in Volume: 96.2% Wound Description Full Thickness Without Exposed Classification: Support Structures Periwound Skin Texture Texture Color No Abnormalities Noted: No No Abnormalities Noted: No Moisture No Abnormalities Noted: No Treatment Notes Wound #2 (Left, Distal, Anterior Lower Leg) Lanpher, Ozzy ([id number]) 1. Cleansed with: Clean wound with Normal Saline 2. Anesthetic Topical Lidocaine 4% cream to wound bed prior to debridement 3. Peri-wound Care: Skin Prep 4. Dressing Applied: Prisma Ag 5.  Secondary Dressing Applied Bordered Foam Dressing Electronic Signature(s) Signed: 05/25/2016 5:28:24 PM By: Alejandro Mulling Entered By: Alejandro Mulling on 05/25/2016 09:22:45 [name] ([id number]) -------------------------------------------------------------------------------- Vitals Details Patient Name: [name] Date of Service: 05/25/2016 8:45 AM Medical Record Number: [id number] Patient Account Number: [id number] Date of Birth/Sex: Dec 25, 1950 (65 y.o. Male) Treating RN: Phillis Haggis Primary Care Physician: Evelene Croon Other Clinician: Referring Physician: Evelene Croon Treating Physician/Extender: Rudene Re in Treatment: 14 Vital Signs Time Taken: 09:16 Temperature (F): 97.7 Height (in): 72 Pulse (bpm): 68 Weight (lbs): 210 Respiratory Rate (breaths/min): 16 Body Mass Index (BMI): 28.5 Blood Pressure (mmHg): 102/56 Reference Range: 80 - 120 mg / dl Electronic Signature(s) Signed: 05/25/2016 5:28:24 PM By: Alejandro Mulling Entered By: Alejandro Mulling on 05/25/2016 09:20:01

## 2016-05-26 NOTE — Progress Notes (Signed)
Jaime Zuniga, Jaime Zuniga ([id number]) Visit Report for 05/25/2016 Chief Complaint Document Details Patient Name: Jaime Zuniga, Jaime Zuniga Date of Service: 05/25/2016 8:45 AM Medical Record Number: [id number] Patient Account Number: [id number] Date of Birth/Sex: Oct 04, 1950 (65 y.o. 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 Male) Treating RN: Phillis Haggis Primary Care Physician: Evelene Croon Other Clinician: Referring Physician: Evelene Croon Treating Physician/Extender: Rudene Re in Treatment: 14 History of Present Illness Location: left lower extremity anterior shin area Quality: Patient reports experiencing a dull pain to affected area(s). Severity: Patient states wound are getting worse. Duration: Patient has had the wound for > 3 months prior to seeking treatment at the wound center Timing: Pain in wound is Intermittent (comes and goes Context: The wound appeared gradually over time Modifying Factors: Other treatment(s) tried include:Augmentin and doxycycline Associated Signs and Symptoms: Patient reports having increase swelling. HPI Description: 65 year old gentleman was being seen in the ER  recently for wounds on his left lower extremity which have been draining and there is swelling and he was concerned about wound infection. in the ER x-ray of the left leg was done which showed postsurgical changes but no acute findings.he wrapped his leg in and Unna's boots and referred him to the wound center. earlier in June he had a DVT study done of the left lower extremity which showed no evidence of DVT but there was edema seen. no reflux was seen on that study. in June he was seen by a surgeon Dr. Lemar Livings who advised symptomatic treatment. the patient was also seen at the [facility] he has recently completed courses of Augmentin and doxycycline. 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 02/24/2016 -- seen by Dr. Kirke Corin on 02/17/2016. Lower extremity arterial studies were done which showed a normal ABI bilaterally and normal great toe pressures. Duplex showed diffuse nonobstructive disease and a three-vessel runoff bilaterally. No further vascular studies or intervention was recommended. He was seen by Dr. Romilda Joy on 02/17/2016 and recommended to continue with wound care and no vascular studies intervention was recommended. Electronic Signature(s) Signed: 05/25/2016 9:31:12 AM By: Evlyn Kanner MD, FACS Entered By: Evlyn Kanner on 05/25/2016 09:31:11 Jaime Zuniga ([id number]) -------------------------------------------------------------------------------- Physical Exam Details Patient Name: Jaime Zuniga Date of Service: 05/25/2016 8:45 AM Medical Record Number: [id number] Patient Account Number: [id number] Date of Birth/Sex: 02-May-1951 (65 y.o. Male) Treating RN: Phillis Haggis Primary Care Physician: Evelene Croon Other Clinician: Referring Physician: Evelene Croon Treating Physician/Extender: Rudene Re in Treatment:  14 Constitutional . Pulse regular. Respirations normal and unlabored. Afebrile. . Eyes Nonicteric. Reactive to light. Ears, Nose, Mouth, and Throat Lips, teeth, and gums WNL.Marland Kitchen Moist mucosa without lesions. Neck supple and nontender. No palpable supraclavicular or cervical adenopathy. Normal sized without goiter. Respiratory WNL. No retractions.. Cardiovascular Pedal Pulses WNL. No clubbing, cyanosis or edema. Lymphatic  No adneopathy. No adenopathy. No adenopathy. Musculoskeletal Adexa without tenderness or enlargement.. Digits and nails w/o clubbing, cyanosis, infection, petechiae, ischemia, or inflammatory conditions.. Integumentary (Hair, Skin) No suspicious lesions. No crepitus or fluctuance. No peri-wound warmth or erythema. No masses.Marland Kitchen. Psychiatric Judgement and insight Intact.. No evidence of depression, anxiety, or agitation.. Notes the wound is much smaller and has healthy granulation tissue and there is no surrounding cellulitis. Electronic Signature(s) Signed: 05/25/2016 9:31:41 AM By: Evlyn KannerBritto, Wadell Craddock MD, FACS Entered By: Evlyn KannerBritto, Shell Yandow on 05/25/2016 09:31:40 Jaime MaclachlanBURTON, Jaime Zuniga ([id number]) -------------------------------------------------------------------------------- Physician Orders Details Patient Name: Jaime MaclachlanBURTON, Bela Date of Service: 05/25/2016 8:45 AM Medical Record Number: [id number] Patient Account Number: [id number] Date of Birth/Sex: 10/02/1950 77(65 y.o. Male) Treating RN: Clover MealyAfful, RN, BSN, Gridley Sinkita Primary Care Physician: Evelene CroonNiemeyer, Meindert Other Clinician: Referring Physician: Evelene CroonNiemeyer, Meindert Treating Physician/Extender: Rudene ReBritto, Nestor Wieneke Weeks in Treatment: 3614 Verbal / Phone Orders: Yes Clinician: Afful, RN, BSN, Rita Read Back and Verified: Yes Diagnosis Coding Wound Cleansing Wound #2 Left,Distal,Anterior Lower Leg o Clean wound with Normal Saline. o Cleanse wound with mild soap and water o May Shower, gently pat wound dry prior to applying new  dressing. Anesthetic Wound #2 Left,Distal,Anterior Lower Leg o Topical Lidocaine 4% cream applied to wound bed prior to debridement - for clinic use Skin Barriers/Peri-Wound Care Wound #2 Left,Distal,Anterior Lower Leg o Barrier cream Primary Wound Dressing Wound #2 Left,Distal,Anterior Lower Leg o Prisma Ag Secondary Dressing Wound #2 Left,Distal,Anterior Lower Leg o Boardered Foam Dressing - dry gauze Dressing Change Frequency Wound #2 Left,Distal,Anterior Lower Leg o Change dressing every other day. Follow-up Appointments Wound #2 Left,Distal,Anterior Lower Leg o Return Appointment in 1 week. Edema Control Wound #2 Left,Distal,Anterior Lower Leg o Patient to wear own compression stockings - DO NOT WRAP PATIENT'S LEG o Elevate legs to the level of the heart and pump ankles as often as possible Jaime Zuniga, Jaime Zuniga ([id number]) o Support Garment 20-30 mm/Hg pressure to: - HOME HEALTH TO PLEASE ORDER STOCKINGS FOR PATIENT. Additional Orders / Instructions Wound #2 Left,Distal,Anterior Lower Leg o Stop Smoking o Increase protein intake. Home Health Wound #2 Left,Distal,Anterior Lower Leg o Continue Home Health Visits - [facility] - DO NOT WRAP PATIENT'S LEG o Home Health Nurse may visit PRN to address patientos wound care needs. - HOME HEALTH TO PLEASE ORDER COMPRESSION STOCKINGS o FACE TO FACE ENCOUNTER: MEDICARE and MEDICAID PATIENTS: I certify that this patient is under my care and that I had a face-to-face encounter that meets the physician face-to-face encounter requirements with this patient on this date. The encounter with the patient was in whole or in part for the following MEDICAL CONDITION: (primary reason for Home Healthcare) MEDICAL NECESSITY: I certify, that based on my findings, NURSING services are a medically necessary home health service. HOME BOUND STATUS: I certify that my clinical findings support that this patient is homebound (i.e., Due  to illness or injury, pt requires aid of supportive devices such as crutches, cane, wheelchairs, walkers, the use of special transportation or the assistance of another person to leave their place of residence. There is a normal inability to leave the home and doing so requires considerable and taxing effort. Other absences are for medical reasons / religious services and are infrequent or of short duration when for other reasons). o If current dressing causes regression in wound condition, may D/C ordered dressing product/s and apply Normal Saline Moist Dressing daily until next Wound Healing Center / Other MD appointment. Notify Wound Healing Center of regression in wound condition at [phone]. o  Please direct any NON-WOUND related issues/requests for orders to patient's Primary Care Physician Electronic Signature(s) Signed: 05/25/2016 3:58:39 PM By: Evlyn Kanner MD, FACS Signed: 05/25/2016 5:42:32 PM By: Elpidio Eric BSN, RN Entered By: Elpidio Eric on 05/25/2016 09:28:10 Jaime Zuniga ([id number]) -------------------------------------------------------------------------------- Problem List Details Patient Name: Jaime Zuniga Date of Service: 05/25/2016 8:45 AM Medical Record Number: [id number] Patient Account Number: [id number] Date of Birth/Sex: 1950/09/10 (65 y.o. 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 M61.462 Other calcification of muscle, left lower leg 02/11/2016 Yes I73.9 Peripheral vascular disease, unspecified 02/11/2016 Yes I89.0 Lymphedema, not elsewhere classified 04/20/2016 Yes Inactive Problems Resolved Problems Electronic Signature(s) Signed:  05/25/2016 9:30:45 AM By: Evlyn Kanner MD, FACS Entered By: Evlyn Kanner on 05/25/2016 09:30:45 Jaime Zuniga ([id number]) -------------------------------------------------------------------------------- Progress Note Details Patient Name: Jaime Zuniga Date of Service: 05/25/2016 8:45 AM Medical Record Number: [id number] Patient Account Number: [id number] Date of Birth/Sex: 03-15-1951 (65 y.o. 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 Severity: Patient states wound are getting worse. Duration: Patient has had the wound for > 3 months prior to seeking treatment at the wound center Timing: Pain in wound is Intermittent (comes and goes Context: The wound appeared gradually over time Modifying Factors: Other treatment(s) tried include:Augmentin and doxycycline Associated Signs and Symptoms: Patient reports having increase swelling. 65 year old gentleman was being seen in the ER recently for wounds on his left lower extremity which have been draining and there is swelling and he was concerned about wound infection. in the ER x-ray of the left leg was done which showed postsurgical changes but no acute findings.he wrapped his leg in and Unna's boots and referred him to the wound center. earlier in June he had a DVT study done of the left lower extremity which showed no evidence of DVT but there  was edema seen. no reflux was seen on that study. in June he was seen by a surgeon Dr. Lemar Livings who advised symptomatic treatment. the patient was also seen at the [facility] he has recently completed courses of Augmentin and doxycycline. 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 02/24/2016 -- seen by Dr. Kirke Corin on 02/17/2016. Lower extremity arterial studies were done which showed a normal ABI bilaterally and normal great toe pressures. Duplex showed diffuse nonobstructive disease and a three-vessel runoff bilaterally. No further vascular studies or intervention was recommended. He was seen by Dr. Romilda Joy on 02/17/2016 and recommended to continue with wound care and no vascular studies intervention was recommended. [place], Irene Limbo ([id number]) Objective Constitutional Pulse regular. Respirations normal and unlabored. Afebrile. Vitals Time Taken: 9:16 AM, Height: 72 in, Weight: 210 lbs, BMI: 28.5, Temperature: 97.7 F, Pulse: 68 bpm, Respiratory Rate:  16 breaths/min, Blood Pressure: 102/56 mmHg. Eyes Nonicteric. Reactive to light. Ears, Nose, Mouth, and Throat Lips, teeth, and gums WNL.Marland Kitchen Moist mucosa without lesions. Neck supple and nontender. No palpable supraclavicular or cervical adenopathy. Normal sized without goiter. Respiratory WNL. No retractions.. Cardiovascular Pedal Pulses WNL. No clubbing, cyanosis or edema. Lymphatic No adneopathy. No adenopathy. No adenopathy. Musculoskeletal Adexa without tenderness or enlargement.. Digits and nails w/o clubbing, cyanosis, infection, petechiae, ischemia, or inflammatory conditions.Marland Kitchen Psychiatric Judgement and insight Intact.. No evidence of depression, anxiety, or agitation.. General Notes: the wound is much smaller and has healthy granulation tissue and there is no surrounding cellulitis. Integumentary (Hair,  Skin) No suspicious lesions. No crepitus or fluctuance. No peri-wound warmth or erythema. No masses.. Wound #2 status is Open. Original cause of wound was Gradually Appeared. The wound is located on the [facility] Lower Leg. The wound measures 0.4cm length x 0.3cm width x 0.1cm depth; 0.094cm^2 area and 0.009cm^3 volume. Jaime Zuniga, Jaime Zuniga ([id number]) Assessment Active Problems ICD-10 [phone] - Non-pressure chronic ulcer of left calf with fat layer exposed M61.462 - Other calcification of muscle, left lower leg I73.9 - Peripheral vascular disease, unspecified I89.0 - Lymphedema, not elsewhere classified Plan Wound Cleansing: Wound #2 Left,Distal,Anterior Lower Leg: Clean wound with Normal Saline. Cleanse wound with mild soap and water May Shower, gently pat wound dry prior to applying new dressing. Anesthetic: Wound #2 Left,Distal,Anterior Lower Leg: Topical Lidocaine 4% cream applied to wound bed prior to debridement - for clinic use Skin Barriers/Peri-Wound Care: Wound #2 Left,Distal,Anterior Lower Leg: Barrier cream Primary Wound Dressing: Wound #2 Left,Distal,Anterior Lower Leg: Prisma Ag Secondary Dressing: Wound #2 Left,Distal,Anterior Lower Leg: Boardered Foam Dressing - dry gauze Dressing Change Frequency: Wound #2 Left,Distal,Anterior Lower Leg: Change dressing every other day. Follow-up Appointments: Wound #2 Left,Distal,Anterior Lower Leg: Return Appointment in 1 week. Edema Control: Wound #2 Left,Distal,Anterior Lower Leg: Patient to wear own compression stockings - DO NOT WRAP PATIENT'S LEG Elevate legs to the level of the heart and pump ankles as often as possible Support Garment 20-30 mm/Hg pressure to: - HOME HEALTH TO PLEASE ORDER STOCKINGS FOR PATIENT. Additional Orders / Instructions: Wound #2 Left,Distal,Anterior Lower Leg: Stop Smoking Increase protein intake. Jaime Zuniga, Jaime Zuniga ([id number]) Home Health: Wound #2 Left,Distal,Anterior Lower  Leg: Continue Home Health Visits - [facility] - DO NOT WRAP PATIENT'S LEG Home Health Nurse may visit PRN to address patient s wound care needs. - HOME HEALTH TO PLEASE ORDER COMPRESSION STOCKINGS FACE TO FACE ENCOUNTER: MEDICARE and MEDICAID PATIENTS: I certify that this patient is under my care and that I had a face-to-face encounter that meets the physician face-to-face encounter requirements with this patient on this date. The encounter with the patient was in whole or in part for the following MEDICAL CONDITION: (primary reason for Home Healthcare) MEDICAL NECESSITY: I certify, that based on my findings, NURSING services are a medically necessary home health service. HOME BOUND STATUS: I certify that my clinical findings support that this patient is homebound (i.e., Due to illness or injury, pt requires aid of supportive devices such as crutches, cane, wheelchairs, walkers, the use of special transportation or the assistance of another person to leave their place of residence. There is a normal inability to leave the home and doing so requires considerable and taxing effort. Other absences are for medical reasons / religious services and are infrequent or of short duration when for other reasons). If current dressing causes regression in wound condition, may D/C ordered dressing product/s and apply Normal Saline Moist  Dressing daily until next Wound Healing Center / Other MD appointment. Notify Wound Healing Center of regression in wound condition at [phone]. Please direct any NON-WOUND related issues/requests for orders to patient's Primary Care Physician the patient continues to smoke and I have urged him to stop smoking. We will continue with [employer] locally and he is to see me back next week. Electronic Signature(s) Signed: 05/25/2016 9:32:16 AM By: Evlyn Kanner MD, FACS Entered By: Evlyn Kanner on 05/25/2016 09:32:16 Jaime Zuniga  ([id number]) -------------------------------------------------------------------------------- SuperBill Details Patient Name: Jaime Zuniga Date of Service: 05/25/2016 Medical Record Number: [id number] Patient Account Number: [id number] Date of Birth/Sex: 11-01-1950 (65 y.o. Male) Treating RN: Phillis Haggis Primary Care Physician: Evelene Croon Other Clinician: Referring Physician: Evelene Croon Treating Physician/Extender: Rudene Re in Treatment: 14 Diagnosis Coding ICD-10 Codes Code Description [phone] Non-pressure chronic ulcer of left calf with fat layer exposed M61.462 Other calcification of muscle, left lower leg I73.9 Peripheral vascular disease, unspecified I89.0 Lymphedema, not elsewhere classified Facility Procedures CPT4 Code: [id number] Description: 99213 - WOUND CARE VISIT-LEV 3 EST PT Modifier: Quantity: 1 Physician Procedures CPT4 Code: [id number] Description: 99213 - WC PHYS LEVEL 3 - EST PT ICD-10 Description Diagnosis L97.222 Non-pressure chronic ulcer of left calf with fat M61.462 Other calcification of muscle, left lower leg I73.9 Peripheral vascular disease, unspecified I89.0 Lymphedema, not  elsewhere classified Modifier: layer exposed Quantity: 1 Electronic Signature(s) Signed: 05/25/2016 9:32:32 AM By: Evlyn Kanner MD, FACS Entered By: Evlyn Kanner on 05/25/2016 09:32:32

## 2016-06-01 ENCOUNTER — Encounter: Payer: Medicare Other | Admitting: Surgery

## 2016-06-01 DIAGNOSIS — L97222 Non-pressure chronic ulcer of left calf with fat layer exposed: Secondary | ICD-10-CM | POA: Diagnosis not present

## 2016-06-02 NOTE — Progress Notes (Signed)
[name], POUND ([id number]) Visit Report for 06/01/2016 Chief Complaint Document Details Patient Name: Jaime Zuniga, Jaime Zuniga Date of Service: 06/01/2016 12:45 PM Medical Record Number: [id number] Patient Account Number: [id number] Date of Birth/Sex: Dec 09, 1950 (65 y.o. 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 Male) Treating RN: Phillis Haggis Primary Care Physician: Evelene Croon Other Clinician: Referring Physician: Evelene Croon Treating Physician/Extender: Rudene Re in Treatment: 15 History of Present Illness Location: left lower extremity anterior shin area Quality: Patient reports experiencing a dull pain to affected area(s). Severity: Patient states wound are getting worse. Duration: Patient has had the wound for > 3 months prior to seeking treatment at the wound center Timing: Pain in wound is Intermittent (comes and goes Context: The wound appeared gradually over time Modifying Factors: Other treatment(s) tried include:Augmentin and doxycycline Associated Signs and Symptoms: Patient reports having increase swelling. HPI Description: 65 year old gentleman was being seen in the ER  recently for wounds on his left lower extremity which have been draining and there is swelling and he was concerned about wound infection. in the ER x-ray of the left leg was done which showed postsurgical changes but no acute findings.he wrapped his leg in and Unna's boots and referred him to the wound center. earlier in June he had a DVT study done of the left lower extremity which showed no evidence of DVT but there was edema seen. no reflux was seen on that study. in June he was seen by a surgeon Dr. Lemar Livings who advised symptomatic treatment. the patient was also seen at the [facility] he has recently completed courses of Augmentin and doxycycline. 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 02/24/2016 -- seen by Dr. Kirke Corin on 02/17/2016. Lower extremity arterial studies were done which showed a normal ABI bilaterally and normal great toe pressures. Duplex showed diffuse nonobstructive disease and a three-vessel runoff bilaterally. No further vascular studies or intervention was recommended. He was seen by Dr. Romilda Joy on 02/17/2016 and recommended to continue with wound care and no vascular studies intervention was recommended. Electronic Signature(s) Signed: 06/01/2016 1:18:34 PM By: Evlyn Kanner MD, FACS Entered By: Evlyn Kanner on 06/01/2016 13:18:34 Jaime Zuniga ([id number]) -------------------------------------------------------------------------------- Physical Exam Details Patient Name: Jaime Zuniga Date of Service: 06/01/2016 12:45 PM Medical Record Number: [id number] Patient Account Number: [id number] Date of Birth/Sex: Apr 17, 1951 (65 y.o. Male) Treating RN: Phillis Haggis Primary Care Physician: Evelene Croon Other Clinician: Referring Physician: Evelene Croon Treating Physician/Extender: Rudene Re in Treatment:  15 Constitutional . Pulse regular. Respirations normal and unlabored. Afebrile. . Eyes Nonicteric. Reactive to light. Ears, Nose, Mouth, and Throat Lips, teeth, and gums WNL.Marland Kitchen Moist mucosa without lesions. Neck supple and nontender. No palpable supraclavicular or cervical adenopathy. Normal sized without goiter. Respiratory WNL. No retractions.. Cardiovascular Pedal Pulses WNL. No clubbing, cyanosis or edema. Lymphatic  No adneopathy. No adenopathy. No adenopathy. Musculoskeletal Adexa without tenderness or enlargement.. Digits and nails w/o clubbing, cyanosis, infection, petechiae, ischemia, or inflammatory conditions.. Integumentary (Hair, Skin) No suspicious lesions. No crepitus or fluctuance. No peri-wound warmth or erythema. No masses.Marland Kitchen Psychiatric Judgement and insight Intact.. No evidence of depression, anxiety, or agitation.. Notes the wound is looking healthy and has epithelization coming in. No sharp debridement was required today. Electronic Signature(s) Signed: 06/01/2016 1:19:32 PM By: Evlyn Kanner MD, FACS Entered By: Evlyn Kanner on 06/01/2016 13:19:32 Jaime Zuniga ([id number]) -------------------------------------------------------------------------------- Physician Orders Details Patient Name: Jaime Zuniga Date of Service: 06/01/2016 12:45 PM Medical Record Number: [id number] Patient Account Number: [id number] Date of Birth/Sex: December 03, 1950 (65 y.o. Male) Treating RN: Clover Mealy, RN, BSN, Claflin Sink Primary Care Physician: Evelene Croon Other Clinician: Referring Physician: Evelene Croon Treating Physician/Extender: Rudene Re in Treatment: 15 Verbal / Phone Orders: Yes Clinician: Afful, RN, BSN, Rita Read Back and Verified: Yes Diagnosis Coding Wound Cleansing Wound #2 Left,Distal,Anterior Lower Leg o Clean wound with Normal Saline. o Cleanse wound with mild soap and water o May Shower, gently pat wound dry prior to applying new  dressing. Anesthetic Wound #2 Left,Distal,Anterior Lower Leg o Topical Lidocaine 4% cream applied to wound bed prior to debridement - for clinic use Skin Barriers/Peri-Wound Care Wound #2 Left,Distal,Anterior Lower Leg o Barrier cream Primary Wound Dressing Wound #2 Left,Distal,Anterior Lower Leg o Prisma Ag Secondary Dressing Wound #2 Left,Distal,Anterior Lower Leg o Boardered Foam Dressing - dry gauze Dressing Change Frequency Wound #2 Left,Distal,Anterior Lower Leg o Change dressing every other day. Follow-up Appointments Wound #2 Left,Distal,Anterior Lower Leg o Return Appointment in 1 week. Edema Control Wound #2 Left,Distal,Anterior Lower Leg o Patient to wear own compression stockings - DO NOT WRAP PATIENT'S LEG o Elevate legs to the level of the heart and pump ankles as often as possible Zuniga, Jaime ([id number]) o Support Garment 20-30 mm/Hg pressure to: - HOME HEALTH TO PLEASE ORDER STOCKINGS FOR PATIENT. Additional Orders / Instructions Wound #2 Left,Distal,Anterior Lower Leg o Stop Smoking o Increase protein intake. Home Health Wound #2 Left,Distal,Anterior Lower Leg o Continue Home Health Visits - [facility] - DO NOT WRAP PATIENT'S LEG o Home Health Nurse may visit PRN to address patientos wound care needs. - HOME HEALTH TO PLEASE ORDER COMPRESSION STOCKINGS o FACE TO FACE ENCOUNTER: MEDICARE and MEDICAID PATIENTS: I certify that this patient is under my care and that I had a face-to-face encounter that meets the physician face-to-face encounter requirements with this patient on this date. The encounter with the patient was in whole or in part for the following MEDICAL CONDITION: (primary reason for Home Healthcare) MEDICAL NECESSITY: I certify, that based on my findings, NURSING services are a medically necessary home health service. HOME BOUND STATUS: I certify that my clinical findings support that this patient is homebound (i.e., Due  to illness or injury, pt requires aid of supportive devices such as crutches, cane, wheelchairs, walkers, the use of special transportation or the assistance of another person to leave their place of residence. There is a normal inability to leave the home and doing so requires considerable and taxing effort. Other absences are for medical reasons / religious services and are infrequent or of short duration when for other reasons). o If current dressing causes regression in wound condition, may D/C ordered dressing product/s and apply Normal Saline Moist Dressing daily until next Wound Healing Center / Other MD appointment. Notify Wound Healing Center of regression in wound condition at [phone]. o  Please direct any NON-WOUND related issues/requests for orders to patient's Primary Care Physician Electronic Signature(s) Signed: 06/01/2016 4:19:07 PM By: Evlyn Kanner MD, FACS Signed: 06/01/2016 5:07:41 PM By: Elpidio Eric BSN, RN Entered By: Elpidio Eric on 06/01/2016 13:13:23 Jaime Zuniga ([id number]) -------------------------------------------------------------------------------- Problem List Details Patient Name: Jaime Zuniga Date of Service: 06/01/2016 12:45 PM Medical Record Number: [id number] Patient Account Number: [id number] Date of Birth/Sex: October 28, 1950 (65 y.o. 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 M61.462 Other calcification of muscle, left lower leg 02/11/2016 Yes I73.9 Peripheral vascular disease, unspecified 02/11/2016 Yes I89.0 Lymphedema, not elsewhere classified 04/20/2016 Yes Inactive Problems Resolved Problems Electronic Signature(s) Signed:  06/01/2016 1:18:20 PM By: Evlyn Kanner MD, FACS Entered By: Evlyn Kanner on 06/01/2016 13:18:19 Jaime Zuniga ([id number]) -------------------------------------------------------------------------------- Progress Note Details Patient Name: Jaime Zuniga Date of Service: 06/01/2016 12:45 PM Medical Record Number: [id number] Patient Account Number: [id number] Date of Birth/Sex: 1950/11/22 (65 y.o. 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 Severity: Patient states wound are getting worse. Duration: Patient has had the wound for > 3 months prior to seeking treatment at the wound center Timing: Pain in wound is Intermittent (comes and goes Context: The wound appeared gradually over time Modifying Factors: Other treatment(s) tried include:Augmentin and doxycycline Associated Signs and Symptoms: Patient reports having increase swelling. 65 year old gentleman was being seen in the ER recently for wounds on his left lower extremity which have been draining and there is swelling and he was concerned about wound infection. in the ER x-ray of the left leg was done which showed postsurgical changes but no acute findings.he wrapped his leg in and Unna's boots and referred him to the wound center. earlier in June he had a DVT study done of the left lower extremity which showed no evidence of DVT but there  was edema seen. no reflux was seen on that study. in June he was seen by a surgeon Dr. Lemar Livings who advised symptomatic treatment. the patient was also seen at the [facility] he has recently completed courses of Augmentin and doxycycline. 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 02/24/2016 -- seen by Dr. Kirke Corin on 02/17/2016. Lower extremity arterial studies were done which showed a normal ABI bilaterally and normal great toe pressures. Duplex showed diffuse nonobstructive disease and a three-vessel runoff bilaterally. No further vascular studies or intervention was recommended. He was seen by Dr. Romilda Joy on 02/17/2016 and recommended to continue with wound care and no vascular studies intervention was recommended. Fobes Zuniga, Jaime Limbo ([id number]) Objective Constitutional Pulse regular. Respirations normal and unlabored. Afebrile. Vitals Time Taken: 12:57 PM, Height: 72 in, Weight: 210 lbs, BMI: 28.5, Temperature: 97.8 F, Pulse: 61 bpm, Respiratory Rate:  16 breaths/min, Blood Pressure: 113/60 mmHg. Eyes Nonicteric. Reactive to light. Ears, Nose, Mouth, and Throat Lips, teeth, and gums WNL.Marland Kitchen Moist mucosa without lesions. Neck supple and nontender. No palpable supraclavicular or cervical adenopathy. Normal sized without goiter. Respiratory WNL. No retractions.. Cardiovascular Pedal Pulses WNL. No clubbing, cyanosis or edema. Lymphatic No adneopathy. No adenopathy. No adenopathy. Musculoskeletal Adexa without tenderness or enlargement.. Digits and nails w/o clubbing, cyanosis, infection, petechiae, ischemia, or inflammatory conditions.Marland Kitchen Psychiatric Judgement and insight Intact.. No evidence of depression, anxiety, or agitation.. General Notes: the wound is looking healthy and has epithelization coming in. No sharp debridement was required today. Integumentary  (Hair, Skin) No suspicious lesions. No crepitus or fluctuance. No peri-wound warmth or erythema. No masses.. Wound #2 status is Open. Original cause of wound was Gradually Appeared. The wound is located on the [facility] Lower Leg. The wound measures 1cm length x 0.5cm width x 0.1cm depth; 0.393cm^2 area and 0.039cm^3 volume. The wound is limited to skin breakdown. There is no tunneling or undermining noted. There is a large amount of serosanguineous drainage noted. There is large (67-100%) red, pink granulation within the wound bed. There is no necrotic tissue within the wound bed. The periwound skin appearance exhibited: Moist. Periwound temperature was noted as No Abnormality. The periwound has tenderness on palpation. Zuniga, Jaime ([id number]) Assessment Active Problems ICD-10 [phone] - Non-pressure chronic ulcer of left calf with fat layer exposed M61.462 - Other calcification of muscle, left lower leg I73.9 - Peripheral vascular disease, unspecified I89.0 - Lymphedema, not elsewhere classified Plan Wound Cleansing: Wound #2 Left,Distal,Anterior Lower Leg: Clean wound with Normal Saline. Cleanse wound with mild soap and water May Shower, gently pat wound dry prior to applying new dressing. Anesthetic: Wound #2 Left,Distal,Anterior Lower Leg: Topical Lidocaine 4% cream applied to wound bed prior to debridement - for clinic use Skin Barriers/Peri-Wound Care: Wound #2 Left,Distal,Anterior Lower Leg: Barrier cream Primary Wound Dressing: Wound #2 Left,Distal,Anterior Lower Leg: Prisma Ag Secondary Dressing: Wound #2 Left,Distal,Anterior Lower Leg: Boardered Foam Dressing - dry gauze Dressing Change Frequency: Wound #2 Left,Distal,Anterior Lower Leg: Change dressing every other day. Follow-up Appointments: Wound #2 Left,Distal,Anterior Lower Leg: Return Appointment in 1 week. Edema Control: Wound #2 Left,Distal,Anterior Lower Leg: Patient to wear own compression  stockings - DO NOT WRAP PATIENT'S LEG Elevate legs to the level of the heart and pump ankles as often as possible Support Garment 20-30 mm/Hg pressure to: - HOME HEALTH TO PLEASE ORDER STOCKINGS FOR PATIENT. Additional Orders / Instructions: Wound #2 Left,Distal,Anterior Lower Leg: Jaime Zuniga, Jaime Zuniga ([id number]) Stop Smoking Increase protein intake. Home Health: Wound #2 Left,Distal,Anterior Lower Leg: Continue Home Health Visits - [facility] - DO NOT WRAP PATIENT'S LEG Home Health Nurse may visit PRN to address patient s wound care needs. - HOME HEALTH TO PLEASE ORDER COMPRESSION STOCKINGS FACE TO FACE ENCOUNTER: MEDICARE and MEDICAID PATIENTS: I certify that this patient is under my care and that I had a face-to-face encounter that meets the physician face-to-face encounter requirements with this patient on this date. The encounter with the patient was in whole or in part for the following MEDICAL CONDITION: (primary reason for Home Healthcare) MEDICAL NECESSITY: I certify, that based on my findings, NURSING services are a medically necessary home health service. HOME BOUND STATUS: I certify that my clinical findings support that this patient is homebound (i.e., Due to illness or injury, pt requires aid of supportive devices such as crutches, cane, wheelchairs, walkers, the use of special transportation or the assistance of another  person to leave their place of residence. There is a normal inability to leave the home and doing so requires considerable and taxing effort. Other absences are for medical reasons / religious services and are infrequent or of short duration when for other reasons). If current dressing causes regression in wound condition, may D/C ordered dressing product/s and apply Normal Saline Moist Dressing daily until next Wound Healing Center / Other MD appointment. Notify Wound Healing Center of regression in wound condition at [phone]. Please direct any NON-WOUND  related issues/requests for orders to patient's Primary Care Physician We will continue with [employer] locally and he is to see me back next week. Electronic Signature(s) Signed: 06/01/2016 1:20:02 PM By: Evlyn KannerBritto, Feliciana Narayan MD, FACS Entered By: Evlyn KannerBritto, Tiera Mensinger on 06/01/2016 13:20:02 Jaime MaclachlanBURTON, Jaime ([id number]) -------------------------------------------------------------------------------- SuperBill Details Patient Name: Jaime MaclachlanBURTON, Jaime Zuniga Date of Service: 06/01/2016 Medical Record Number: [id number] Patient Account Number: [id number] Date of Birth/Sex: 1951/07/24 (65 y.o. Male) Treating RN: Phillis HaggisPinkerton, Debi Primary Care Physician: Evelene CroonNiemeyer, Meindert Other Clinician: Referring Physician: Evelene CroonNiemeyer, Meindert Treating Physician/Extender: Rudene ReBritto, Zykeem Bauserman Weeks in Treatment: 15 Diagnosis Coding ICD-10 Codes Code Description [phone] Non-pressure chronic ulcer of left calf with fat layer exposed M61.462 Other calcification of muscle, left lower leg I73.9 Peripheral vascular disease, unspecified I89.0 Lymphedema, not elsewhere classified Physician Procedures CPT4 Code: [id number] Description: 99213 - WC PHYS LEVEL 3 - EST PT ICD-10 Description Diagnosis L97.222 Non-pressure chronic ulcer of left calf with fat M61.462 Other calcification of muscle, left lower leg I89.0 Lymphedema, not elsewhere classified I73.9 Peripheral vascular  disease, unspecified Modifier: layer exposed Quantity: 1 Electronic Signature(s) Signed: 06/01/2016 1:20:17 PM By: Evlyn KannerBritto, Sumer Moorehouse MD, FACS Entered By: Evlyn KannerBritto, Cherly Erno on 06/01/2016 13:20:16

## 2016-06-03 NOTE — Progress Notes (Signed)
JAIDEV, SANGER ([id number]) Visit Report for 06/01/2016 Arrival Information Details Patient Name: Jaime Zuniga, Jaime Zuniga Date of Service: 06/01/2016 12:45 PM Medical Record Number: [id number] Patient Account Number: [id number] Date of Birth/Sex: 04-02-1951 (65 y.o. Male) Treating RN: Phillis Haggis Primary Care Physician: Evelene Croon Other Clinician: Referring Physician: Evelene Croon Treating Physician/Extender: Rudene Re in Treatment: 15 Visit Information History Since Last Visit All ordered tests and consults were completed: No Patient Arrived: Dan Humphreys Added or deleted any medications: No Arrival Time: 12:54 Any new allergies or adverse reactions: No Accompanied By: caregiver Had a fall or experienced change in No Transfer Assistance: None activities of daily living that may affect Patient Identification Verified: Yes risk of falls: Secondary Verification Process Yes Signs or symptoms of abuse/neglect since last No Completed: visito Patient Requires Transmission-Based No Hospitalized since last visit: No Precautions: Pain Present Now: No Patient Has Alerts: No Electronic Signature(s) Signed: 06/02/2016 5:06:12 PM By: Alejandro Mulling Entered By: Alejandro Mulling on 06/01/2016 12:54:28 Jaime Zuniga ([id number]) -------------------------------------------------------------------------------- Clinic Level of Care Assessment Details Patient Name: Jaime Zuniga Date of Service: 06/01/2016 12:45 PM Medical Record Number: [id number] Patient Account Number: [id number] Date of Birth/Sex: 05-24-1951 (65 y.o. Male) Treating RN: Clover Mealy, RN, BSN, Rita Primary Care Physician: Evelene Croon Other Clinician: Referring Physician: Evelene Croon Treating Physician/Extender: Rudene Re in Treatment: 15 Clinic Level of Care Assessment Items TOOL 4 Quantity Score []  - Use when only an EandM is performed on FOLLOW-UP visit 0 ASSESSMENTS - Nursing Assessment /  Reassessment X - Reassessment of Co-morbidities (includes updates in patient status) 1 10 X - Reassessment of Adherence to Treatment Plan 1 5 ASSESSMENTS - Wound and Skin Assessment / Reassessment X - Simple Wound Assessment / Reassessment - one wound 1 5 []  - Complex Wound Assessment / Reassessment - multiple wounds 0 []  - Dermatologic / Skin Assessment (not related to wound area) 0 ASSESSMENTS - Focused Assessment []  - Circumferential Edema Measurements - multi extremities 0 []  - Nutritional Assessment / Counseling / Intervention 0 X - Lower Extremity Assessment (monofilament, tuning fork, pulses) 1 5 []  - Peripheral Arterial Disease Assessment (using hand held doppler) 0 ASSESSMENTS - Ostomy and/or Continence Assessment and Care []  - Incontinence Assessment and Management 0 []  - Ostomy Care Assessment and Management (repouching, etc.) 0 PROCESS - Coordination of Care X - Simple Patient / Family Education for ongoing care 1 15 []  - Complex (extensive) Patient / Family Education for ongoing care 0 X - Staff obtains Chiropractor, Records, Test Results / Process Orders 1 10 []  - Staff telephones HHA, Nursing Homes / Clarify orders / etc 0 []  - Routine Transfer to another Facility (non-emergent condition) 0 Mittal, Handy ([id number]) []  - Routine Hospital Admission (non-emergent condition) 0 []  - New Admissions / Manufacturing engineer / Ordering NPWT, Apligraf, etc. 0 []  - Emergency Hospital Admission (emergent condition) 0 []  - Simple Discharge Coordination 0 []  - Complex (extensive) Discharge Coordination 0 PROCESS - Special Needs []  - Pediatric / Minor Patient Management 0 []  - Isolation Patient Management 0 []  - Hearing / Language / Visual special needs 0 []  - Assessment of Community assistance (transportation, D/C planning, etc.) 0 []  - Additional assistance / Altered mentation 0 []  - Support Surface(s) Assessment (bed, cushion, seat, etc.) 0 INTERVENTIONS - Wound Cleansing /  Measurement X - Simple Wound Cleansing - one wound 1 5 []  - Complex Wound Cleansing - multiple wounds 0 X - Wound Imaging (photographs - any number of wounds) 1 5 []  - Wound Tracing (  instead of photographs) 0 X - Simple Wound Measurement - one wound 1 5 []  - Complex Wound Measurement - multiple wounds 0 INTERVENTIONS - Wound Dressings X - Small Wound Dressing one or multiple wounds 1 10 []  - Medium Wound Dressing one or multiple wounds 0 []  - Large Wound Dressing one or multiple wounds 0 []  - Application of Medications - topical 0 []  - Application of Medications - injection 0 INTERVENTIONS - Miscellaneous []  - External ear exam 0 Tacey, Estevan ([id number]) []  - Specimen Collection (cultures, biopsies, blood, body fluids, etc.) 0 []  - Specimen(s) / Culture(s) sent or taken to Lab for analysis 0 []  - Patient Transfer (multiple staff / Michiel SitesHoyer Lift / Similar devices) 0 []  - Simple Staple / Suture removal (25 or less) 0 []  - Complex Staple / Suture removal (26 or more) 0 []  - Hypo / Hyperglycemic Management (close monitor of Blood Glucose) 0 []  - Ankle / Brachial Index (ABI) - do not check if billed separately 0 X - Vital Signs 1 5 Has the patient been seen at the hospital within the last three years: Yes Total Score: 80 Level Of Care: New/Established - Level 3 Electronic Signature(s) Signed: 06/01/2016 5:07:41 PM By: Elpidio EricAfful, Rita BSN, RN Entered By: Elpidio EricAfful, Rita on 06/01/2016 14:30:34 Jaime MaclachlanBURTON, Jaime Zuniga ([id number]) -------------------------------------------------------------------------------- Encounter Discharge Information Details Patient Name: Jaime MaclachlanBURTON, Jaime Zuniga Date of Service: 06/01/2016 12:45 PM Medical Record Number: [id number] Patient Account Number: [id number] Date of Birth/Sex: 29-Oct-1950 [age] y.o. Male) Treating RN: Phillis HaggisPinkerton, Debi Primary Care Physician: Evelene CroonNiemeyer, Meindert Other Clinician: Referring Physician: Evelene CroonNiemeyer, Meindert Treating Physician/Extender: Rudene ReBritto, Errol Weeks in  Treatment: 15 Encounter Discharge Information Items Discharge Pain Level: 0 Discharge Condition: Stable Ambulatory Status: Walker Discharge Destination: Nursing Home Transportation: Other Accompanied By: caregiver Schedule Follow-up Appointment: Yes Medication Reconciliation completed and provided to Patient/Care Yes Katriel Cutsforth: Provided on Clinical Summary of Care: 06/01/2016 Form Type Recipient Paper Patient SB Electronic Signature(s) Signed: 06/01/2016 1:20:22 PM By: Gwenlyn PerkingMoore, Shelia Entered By: Gwenlyn PerkingMoore, Shelia on 06/01/2016 13:20:22 Jaime MaclachlanBURTON, Jaime Zuniga ([id number]) -------------------------------------------------------------------------------- Lower Extremity Assessment Details Patient Name: Jaime MaclachlanBURTON, Jaime Zuniga Date of Service: 06/01/2016 12:45 PM Medical Record Number: [id number] Patient Account Number: [id number] Date of Birth/Sex: 29-Oct-1950 (65 y.o. Male) Treating RN: Phillis HaggisPinkerton, Debi Primary Care Physician: Evelene CroonNiemeyer, Meindert Other Clinician: Referring Physician: Evelene CroonNiemeyer, Meindert Treating Physician/Extender: Rudene ReBritto, Errol Weeks in Treatment: 15 Vascular Assessment Pulses: Posterior Tibial Dorsalis Pedis Palpable: [Left:Yes] Extremity colors, hair growth, and conditions: Extremity Color: [Left:Hyperpigmented] Temperature of Extremity: [Left:Warm] Capillary Refill: [Left:< 3 seconds] Toe Nail Assessment Left: Right: Thick: Yes Discolored: Yes Deformed: No Improper Length and Hygiene: Yes Electronic Signature(s) Signed: 06/02/2016 5:06:12 PM By: Alejandro MullingPinkerton, Debra Entered By: Alejandro MullingPinkerton, Debra on 06/01/2016 13:00:16 Jaime MaclachlanBURTON, Jaime Zuniga ([id number]) -------------------------------------------------------------------------------- Multi Wound Chart Details Patient Name: Jaime MaclachlanBURTON, Jaime Zuniga Date of Service: 06/01/2016 12:45 PM Medical Record Number: [id number] Patient Account Number: [id number] Date of Birth/Sex: 29-Oct-1950 [age] y.o. Male) Treating RN: Clover MealyAfful, RN, BSN, Rita Primary Care  Physician: Evelene CroonNiemeyer, Meindert Other Clinician: Referring Physician: Evelene CroonNiemeyer, Meindert Treating Physician/Extender: Rudene ReBritto, Errol Weeks in Treatment: 15 Vital Signs Height(in): 72 Pulse(bpm): 61 Weight(lbs): 210 Blood Pressure 113/60 (mmHg): Body Mass Index(BMI): 28 Temperature(F): 97.8 Respiratory Rate 16 (breaths/min): Photos: [N/A:N/A] Wound Location: Left Lower Leg - Anterior, N/A N/A Distal Wounding Event: Gradually Appeared N/A N/A Primary Etiology: Venous Leg Ulcer N/A N/A Comorbid History: Chronic Obstructive N/A N/A Pulmonary Disease (COPD), Hypertension, Myocardial Infarction, Osteoarthritis, Dementia Date Acquired: 01/26/2016 N/A N/A Weeks of Treatment: 15 N/A N/A Wound Status: Open N/A N/A Measurements L x W x D 1x0.5x0.1  N/A N/A (cm) Area (cm) : 0.393 N/A N/A Volume (cm) : 0.039 N/A N/A % Reduction in Area: 66.60% N/A N/A % Reduction in Volume: 83.50% N/A N/A Classification: Full Thickness Without N/A N/A Exposed Support Structures Exudate Amount: Large N/A N/A Exudate Type: Serosanguineous N/A N/A Jaime Zuniga, Jaime Zuniga ([id number]) Exudate Color: red, brown N/A N/A Granulation Amount: Large (67-100%) N/A N/A Granulation Quality: Red, Pink N/A N/A Necrotic Amount: None Present (0%) N/A N/A Exposed Structures: Fascia: No N/A N/A Fat: No Tendon: No Muscle: No Joint: No Bone: No Limited to Skin Breakdown Epithelialization: None N/A N/A Periwound Skin Texture: No Abnormalities Noted N/A N/A Periwound Skin Moist: Yes N/A N/A Moisture: Periwound Skin Color: No Abnormalities Noted N/A N/A Temperature: No Abnormality N/A N/A Tenderness on Yes N/A N/A Palpation: Wound Preparation: Ulcer Cleansing: N/A N/A Rinsed/Irrigated with Saline Topical Anesthetic Applied: Other: lidocaine 4% Treatment Notes Electronic Signature(s) Signed: 06/01/2016 5:07:41 PM By: Elpidio Eric BSN, RN Entered By: Elpidio Eric on 06/01/2016 13:13:02 Jaime Zuniga  ([id number]) -------------------------------------------------------------------------------- Multi-Disciplinary Care Plan Details Patient Name: Jaime Zuniga Date of Service: 06/01/2016 12:45 PM Medical Record Number: [id number] Patient Account Number: [id number] Date of Birth/Sex: 1950-10-30 (65 y.o. Male) Treating RN: Clover Mealy, RN, BSN, [place] Sink Primary Care Physician: Evelene Croon Other Clinician: Referring Physician: Evelene Croon Treating Physician/Extender: Rudene Re in Treatment: 15 Active Inactive Abuse / Safety / Falls / Self Care Management Nursing Diagnoses: Potential for falls Goals: Patient will remain injury free Date Initiated: 02/11/2016 Goal Status: Active Interventions: Assess fall risk on admission and as needed Assess self care needs on admission and as needed Notes: Nutrition Nursing Diagnoses: Imbalanced nutrition Goals: Patient/caregiver agrees to and verbalizes understanding of need to use nutritional supplements and/or vitamins as prescribed Date Initiated: 02/11/2016 Goal Status: Active Interventions: Assess patient nutrition upon admission and as needed per policy Notes: Orientation to the Wound Care Program Nursing Diagnoses: Knowledge deficit related to the wound healing center program GoalsDATON, Jaime Zuniga ([id number]) Patient/caregiver will verbalize understanding of the Wound Healing Center Program Date Initiated: 02/11/2016 Goal Status: Active Interventions: Provide education on orientation to the wound center Notes: Pain, Acute or Chronic Nursing Diagnoses: Pain, acute or chronic: actual or potential Potential alteration in comfort, pain Goals: Patient will verbalize adequate pain control and receive pain control interventions during procedures as needed Date Initiated: 02/11/2016 Goal Status: Active Patient/caregiver will verbalize adequate pain control between visits Date Initiated: 02/11/2016 Goal Status:  Active Interventions: Assess comfort goal upon admission Complete pain assessment as per visit requirements Notes: Soft Tissue Infection Nursing Diagnoses: Impaired tissue integrity Goals: Patient/caregiver will verbalize understanding of or measures to prevent infection and contamination in the home setting Date Initiated: 02/11/2016 Goal Status: Active Patient's soft tissue infection will resolve Date Initiated: 02/11/2016 Goal Status: Active Interventions: Assess signs and symptoms of infection every visit Jaime Zuniga, Jaime Zuniga ([id number]) Notes: Wound/Skin Impairment Nursing Diagnoses: Impaired tissue integrity Knowledge deficit related to smoking impact on wound healing Goals: Patient will demonstrate a reduced rate of smoking or cessation of smoking Date Initiated: 02/11/2016 Goal Status: Active Ulcer/skin breakdown will have a volume reduction of 30% by week 4 Date Initiated: 02/11/2016 Goal Status: Active Ulcer/skin breakdown will have a volume reduction of 50% by week 8 Date Initiated: 02/11/2016 Goal Status: Active Ulcer/skin breakdown will have a volume reduction of 80% by week 12 Date Initiated: 02/11/2016 Goal Status: Active Interventions: Assess ulceration(s) every visit Notes: Electronic Signature(s) Signed: 06/01/2016 5:07:41 PM By: Elpidio Eric BSN, RN Entered By: Elpidio Eric on 06/01/2016 13:12:47  Jaime Zuniga, Jaime Zuniga ([id number]) -------------------------------------------------------------------------------- Pain Assessment Details Patient Name: Jaime Zuniga, Jaime Zuniga Date of Service: 06/01/2016 12:45 PM Medical Record Number: [id number] Patient Account Number: [id number] Date of Birth/Sex: April 02, 1951 (65 y.o. Male) Treating RN: Phillis Haggis Primary Care Physician: Evelene Croon Other Clinician: Referring Physician: Evelene Croon Treating Physician/Extender: Rudene Re in Treatment: 15 Active Problems Location of Pain Severity and Description of  Pain Patient Has Paino No Site Locations With Dressing Change: No Pain Management and Medication Current Pain Management: Electronic Signature(s) Signed: 06/02/2016 5:06:12 PM By: Alejandro Mulling Entered By: Alejandro Mulling on 06/01/2016 12:54:35 Jaime Zuniga ([id number]) -------------------------------------------------------------------------------- Patient/Caregiver Education Details Patient Name: Jaime Zuniga Date of Service: 06/01/2016 12:45 PM Medical Record Number: [id number] Patient Account Number: [id number] Date of Birth/Gender: Aug 20, 1950 (65 y.o. Male) Treating RN: Phillis Haggis Primary Care Physician: Evelene Croon Other Clinician: Referring Physician: Evelene Croon Treating Physician/Extender: Rudene Re in Treatment: 15 Education Assessment Education Provided To: Patient Education Topics Provided Wound/Skin Impairment: Handouts: Other: change dressing as ordered Methods: Demonstration, Explain/Verbal Responses: State content correctly Electronic Signature(s) Signed: 06/02/2016 5:06:12 PM By: Alejandro Mulling Entered By: Alejandro Mulling on 06/01/2016 13:06:46 Jaime Zuniga ([id number]) -------------------------------------------------------------------------------- Wound Assessment Details Patient Name: Jaime Zuniga Date of Service: 06/01/2016 12:45 PM Medical Record Number: [id number] Patient Account Number: [id number] Date of Birth/Sex: [birth year] (65 y.o. Male) Treating RN: Phillis Haggis Primary Care Physician: Evelene Croon Other Clinician: Referring Physician: Evelene Croon Treating Physician/Extender: Rudene Re in Treatment: 15 Wound Status Wound Number: 2 Primary Venous Leg Ulcer Etiology: Wound Location: Left Lower Leg - Anterior, Distal Wound Open Status: Wounding Event: Gradually Appeared Comorbid Chronic Obstructive Pulmonary Disease Date Acquired: 01/26/2016 History: (COPD), Hypertension,  Myocardial Weeks Of Treatment: 15 Infarction, Osteoarthritis, Dementia Clustered Wound: No Photos Photo Uploaded By: Alejandro Mulling on 06/01/2016 13:07:39 Wound Measurements Length: (cm) 1 Width: (cm) 0.5 Depth: (cm) 0.1 Area: (cm) 0.393 Volume: (cm) 0.039 % Reduction in Area: 66.6% % Reduction in Volume: 83.5% Epithelialization: None Tunneling: No Undermining: No Wound Description Full Thickness Without Exposed Foul Odor Aft Classification: Support Structures Exudate Large Amount: Exudate Type: Serosanguineous Exudate Color: red, brown er Cleansing: No Wound Bed Granulation Amount: Large (67-100%) Exposed Structure Granulation Quality: Red, Pink Fascia Exposed: No Necrotic Amount: None Present (0%) Fat Layer Exposed: No Jaime Zuniga, Jaime Zuniga ([id number]) Tendon Exposed: No Muscle Exposed: No Joint Exposed: No Bone Exposed: No Limited to Skin Breakdown Periwound Skin Texture Texture Color No Abnormalities Noted: No No Abnormalities Noted: No Moisture Temperature / Pain No Abnormalities Noted: No Temperature: No Abnormality Moist: Yes Tenderness on Palpation: Yes Wound Preparation Ulcer Cleansing: Rinsed/Irrigated with Saline Topical Anesthetic Applied: Other: lidocaine 4%, Treatment Notes Wound #2 (Left, Distal, Anterior Lower Leg) 1. Cleansed with: Clean wound with Normal Saline 2. Anesthetic Topical Lidocaine 4% cream to wound bed prior to debridement 3. Peri-wound Care: Skin Prep 4. Dressing Applied: Prisma Ag 5. Secondary Dressing Applied Bordered Foam Dressing Dry Gauze Electronic Signature(s) Signed: 06/02/2016 5:06:12 PM By: Alejandro Mulling Entered By: Alejandro Mulling on 06/01/2016 13:00:54 Jaime Zuniga ([id number]) -------------------------------------------------------------------------------- Vitals Details Patient Name: Jaime Zuniga Date of Service: 06/01/2016 12:45 PM Medical Record Number: [id number] Patient Account Number:  [id number] Date of Birth/Sex: 02-Nov-1950 (65 y.o. Male) Treating RN: Phillis Haggis Primary Care Physician: Evelene Croon Other Clinician: Referring Physician: Evelene Croon Treating Physician/Extender: Rudene Re in Treatment: 15 Vital Signs Time Taken: 12:57 Temperature (F): 97.8 Height (in): 72 Pulse (bpm): 61 Weight (lbs): 210 Respiratory Rate (breaths/min): 16 Body Mass Index (BMI): 28.5 Blood Pressure (mmHg): 113/60 Reference  Range: 80 - 120 mg / dl Electronic Signature(s) Signed: 06/02/2016 5:06:12 PM By: Alejandro MullingPinkerton, Debra Entered By: Alejandro MullingPinkerton, Debra on 06/01/2016 12:57:33

## 2016-06-08 ENCOUNTER — Encounter: Payer: Medicare Other | Attending: Surgery | Admitting: Surgery

## 2016-06-08 DIAGNOSIS — I890 Lymphedema, not elsewhere classified: Secondary | ICD-10-CM | POA: Insufficient documentation

## 2016-06-08 DIAGNOSIS — L97222 Non-pressure chronic ulcer of left calf with fat layer exposed: Secondary | ICD-10-CM | POA: Diagnosis not present

## 2016-06-08 DIAGNOSIS — I739 Peripheral vascular disease, unspecified: Secondary | ICD-10-CM | POA: Insufficient documentation

## 2016-06-08 DIAGNOSIS — M61462 Other calcification of muscle, left lower leg: Secondary | ICD-10-CM | POA: Diagnosis not present

## 2016-06-09 NOTE — Progress Notes (Signed)
Jaime MaclachlanBURTON, Jaime Zuniga ([id number]) Visit Report for 06/08/2016 Arrival Information Details Patient Name: Jaime MaclachlanBURTON, Jaime Zuniga Date of Service: 06/08/2016 1:30 PM Medical Record Number: [id number] Patient Account Number: [id number] Date of Birth/Sex: 1951/03/24 (65 y.o. Male) Treating Jaime Zuniga: Phillis HaggisPinkerton, Debi Primary Care Physician: Evelene CroonNiemeyer, Meindert Other Clinician: Referring Physician: Evelene CroonNiemeyer, Meindert Treating Physician/Extender: Rudene ReBritto, Errol Weeks in Treatment: 16 Visit Information History Since Last Visit All ordered tests and consults were completed: No Patient Arrived: Jaime Zuniga Added or deleted any medications: No Arrival Time: 13:40 Any new allergies or adverse reactions: No Accompanied By: caregiver Had a fall or experienced change in No Transfer Assistance: EasyP[employer] activities of daily living that may affect Patient Lift risk of falls: Patient Identification Verified: Yes Signs or symptoms of abuse/neglect since last No Secondary Verification Process Yes visito Completed: Hospitalized since last visit: No Patient Requires Transmission- No Pain Present Now: No Based Precautions: Patient Has Alerts: No Electronic Signature(s) Signed: 06/08/2016 4:59:43 PM By: Alejandro MullingPinkerton, Debra Entered By: Alejandro MullingPinkerton, Debra on 06/08/2016 13:43:45 Jaime MaclachlanBURTON, Jaime Zuniga ([id number]) -------------------------------------------------------------------------------- Clinic Level of Care Assessment Details Patient Name: Jaime MaclachlanBURTON, Jaime Zuniga Date of Service: 06/08/2016 1:30 PM Medical Record Number: [id number] Patient Account Number: [id number] Date of Birth/Sex: 1951/03/24 51(65 y.o. Male) Treating Jaime Zuniga: Jaime MealyAfful, Jaime Zuniga, Zuniga, Jaime Zuniga Primary Care Physician: Evelene CroonNiemeyer, Meindert Other Clinician: Referring Physician: Evelene CroonNiemeyer, Meindert Treating Physician/Extender: Rudene ReBritto, Errol Weeks in Treatment: 16 Clinic Level of Care Assessment Items TOOL 4 Quantity Score []  - Use when only an EandM is performed on FOLLOW-UP visit 0 ASSESSMENTS -  Nursing Assessment / Reassessment X - Reassessment of Co-morbidities (includes updates in patient status) 1 10 X - Reassessment of Adherence to Treatment Plan 1 5 ASSESSMENTS - Wound and Skin Assessment / Reassessment X - Simple Wound Assessment / Reassessment - one wound 1 5 []  - Complex Wound Assessment / Reassessment - multiple wounds 0 []  - Dermatologic / Skin Assessment (not related to wound area) 0 ASSESSMENTS - Focused Assessment []  - Circumferential Edema Measurements - multi extremities 0 []  - Nutritional Assessment / Counseling / Intervention 0 X - Lower Extremity Assessment (monofilament, tuning fork, pulses) 1 5 []  - Peripheral Arterial Disease Assessment (using hand held doppler) 0 ASSESSMENTS - Ostomy and/or Continence Assessment and Care []  - Incontinence Assessment and Management 0 []  - Ostomy Care Assessment and Management (repouching, etc.) 0 PROCESS - Coordination of Care X - Simple Patient / Family Education for ongoing care 1 15 []  - Complex (extensive) Patient / Family Education for ongoing care 0 []  - Staff obtains ChiropractorConsents, Records, Test Results / Process Orders 0 []  - Staff telephones HHA, Nursing Homes / Clarify orders / etc 0 []  - Routine Transfer to another Facility (non-emergent condition) 0 Jaime Zuniga, Jaime Zuniga ([id number]) []  - Routine Hospital Admission (non-emergent condition) 0 []  - New Admissions / [employer] Authorizations / Ordering NPWT, Apligraf, etc. 0 []  - Emergency Hospital Admission (emergent condition) 0 []  - Simple Discharge Coordination 0 []  - Complex (extensive) Discharge Coordination 0 PROCESS - Special Needs []  - Pediatric / Minor Patient Management 0 []  - Isolation Patient Management 0 []  - Hearing / Language / Visual special needs 0 []  - Assessment of Community assistance (transportation, D/C planning, etc.) 0 []  - Additional assistance / Altered mentation 0 []  - Support Surface(s) Assessment (bed, cushion, seat, etc.) 0 INTERVENTIONS -  Wound Cleansing / Measurement X - Simple Wound Cleansing - one wound 1 5 []  - Complex Wound Cleansing - multiple wounds 0 X - Wound Imaging (photographs - any number of wounds) 1 5 []  -  Wound Tracing (instead of photographs) 0 X - Simple Wound Measurement - one wound 1 5 []  - Complex Wound Measurement - multiple wounds 0 INTERVENTIONS - Wound Dressings X - Small Wound Dressing one or multiple wounds 1 10 []  - Medium Wound Dressing one or multiple wounds 0 []  - Large Wound Dressing one or multiple wounds 0 []  - Application of Medications - topical 0 []  - Application of Medications - injection 0 INTERVENTIONS - Miscellaneous []  - External ear exam 0 Mazzola, Jaime Zuniga ([id number]) []  - Specimen Collection (cultures, biopsies, blood, body fluids, etc.) 0 []  - Specimen(s) / Culture(s) sent or taken to Lab for analysis 0 []  - Patient Transfer (multiple staff / Michiel Sites Lift / Similar devices) 0 []  - Simple Staple / Suture removal (25 or less) 0 []  - Complex Staple / Suture removal (26 or more) 0 []  - Hypo / Hyperglycemic Management (close monitor of Blood Glucose) 0 []  - Ankle / Brachial Index (ABI) - do not check if billed separately 0 X - Vital Signs 1 5 Has the patient been seen at the hospital within the last three years: Yes Total Score: 70 Level Of Care: New/Established - Level 2 Electronic Signature(s) Signed: 06/08/2016 5:20:58 PM By: Jaime Eric BSN, Jaime Zuniga Entered By: Jaime Eric on 06/08/2016 13:55:04 Jaime Zuniga ([id number]) -------------------------------------------------------------------------------- Encounter Discharge Information Details Patient Name: Jaime Zuniga Date of Service: 06/08/2016 1:30 PM Medical Record Number: [id number] Patient Account Number: [id number] Date of Birth/Sex: Jan 08, 1951 (65 y.o. Male) Treating Jaime Zuniga: Phillis Haggis Primary Care Physician: Evelene Croon Other Clinician: Referring Physician: Evelene Croon Treating Physician/Extender: Rudene Re in Treatment: 16 Encounter Discharge Information Items Discharge Pain Level: 0 Discharge Condition: Stable Ambulatory Status: Jaime Zuniga Discharge Destination: Nursing Home Transportation: Other Accompanied By: caregiver Schedule Follow-up Appointment: Yes Medication Reconciliation completed Yes and provided to Patient/Care Indy Prestwood: Provided on Clinical Summary of Care: 06/08/2016 Form Type Recipient Paper Patient SB Electronic Signature(s) Signed: 06/08/2016 2:00:30 PM By: Gwenlyn Perking Entered By: Gwenlyn Perking on 06/08/2016 14:00:30 Jaime Zuniga ([id number]) -------------------------------------------------------------------------------- Lower Extremity Assessment Details Patient Name: Jaime Zuniga Date of Service: 06/08/2016 1:30 PM Medical Record Number: [id number] Patient Account Number: [id number] Date of Birth/Sex: 1951-02-07 (65 y.o. Male) Treating Jaime Zuniga: Phillis Haggis Primary Care Physician: Evelene Croon Other Clinician: Referring Physician: Evelene Croon Treating Physician/Extender: Rudene Re in Treatment: 16 Vascular Assessment Pulses: Posterior Tibial Dorsalis Pedis Palpable: [Left:Yes] Extremity colors, hair growth, and conditions: Extremity Color: [Left:Hyperpigmented] Temperature of Extremity: [Left:Warm] Capillary Refill: [Left:< 3 seconds] Toe Nail Assessment Left: Right: Thick: Yes Discolored: Yes Deformed: No Improper Length and Hygiene: Yes Electronic Signature(s) Signed: 06/08/2016 4:59:43 PM By: Alejandro Mulling Entered By: Alejandro Mulling on 06/08/2016 13:48:15 Jaime Zuniga ([id number]) -------------------------------------------------------------------------------- Multi Wound Chart Details Patient Name: Jaime Zuniga Date of Service: 06/08/2016 1:30 PM Medical Record Number: [id number] Patient Account Number: [id number] Date of Birth/Sex: February 10, 1951 (65 y.o. 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] [N/A:N/A] Comorbid History: [2:Chronic Obstructive Pulmonary Disease (COPD), Hypertension, Myocardial Infarction, Osteoarthritis, Dementia] [N/A:N/A] Date Acquired: [2:01/26/2016] [N/A:N/A] Weeks of Treatment: [2:16] [N/A:N/A] Wound Status: [2:Open] [N/A:N/A] Measurements L x W x D 0.3x0.3x0.1 [N/A:N/A] (cm) Area (  cm) : [2:0.071] [N/A:N/A] Volume (cm) : [2:0.007] [N/A:N/A] % Reduction in Area: [2:94.00%] [N/A:N/A] % Reduction in Volume: 97.00% [N/A:N/A] Classification: [2:Full Thickness Without Exposed Support Structures] [N/A:N/A] Exudate Amount: [2:Large] [N/A:N/A] Exudate Type: [2:Serosanguineous] [N/A:N/A] Exudate Color: [2:red, brown] [N/A:N/A] Granulation Amount: [2:Large (67-100%)] [N/A:N/A] Granulation Quality: [2:Red, Pink] [N/A:N/A] Necrotic Amount: [2:None Present (0%)] [N/A:N/A] Exposed Structures: [2:Fascia: No Fat: No] [N/A:N/A] Tendon: No Muscle: No Joint: No Bone: No Limited to Skin Breakdown Epithelialization: None N/A N/A Periwound Skin Texture: No Abnormalities Noted N/A N/A Periwound Skin Moist: Yes N/A N/A Moisture: Periwound Skin Color: No Abnormalities Noted N/A N/A Temperature: No Abnormality N/A N/A Tenderness on Yes N/A N/A Palpation: Wound Preparation: Ulcer Cleansing: N/A N/A Rinsed/Irrigated with Saline Topical Anesthetic Applied: Other: lidocaine 4% Treatment Notes Electronic Signature(s) Signed: 06/08/2016 5:20:58 PM By:  Jaime Eric BSN, Jaime Zuniga Entered By: Jaime Eric on 06/08/2016 13:54:24 Jaime Zuniga ([id number]) -------------------------------------------------------------------------------- Multi-Disciplinary Care Plan Details Patient Name: Jaime Zuniga Date of Service: 06/08/2016 1:30 PM Medical Record Number: [id number] Patient Account Number: [id number] Date of Birth/Sex: [birth year] (65 y.o. Male) Treating Jaime Zuniga: Jaime Mealy, Jaime Zuniga, Zuniga, Layton Sink Primary Care Physician: Evelene Croon Other Clinician: Referring Physician: Evelene Croon Treating Physician/Extender: Rudene Re in Treatment: 16 Active Inactive Abuse / Safety / Falls / Self Care Management Nursing Diagnoses: Potential for falls Goals: Patient will remain injury free Date Initiated: 02/11/2016 Goal Status: Active Interventions: Assess fall risk on admission and as needed Assess self care needs on admission and as needed Notes: Nutrition Nursing Diagnoses: Imbalanced nutrition Goals: Patient/caregiver agrees to and verbalizes understanding of need to use nutritional supplements and/or vitamins as prescribed Date Initiated: 02/11/2016 Goal Status: Active Interventions: Assess patient nutrition upon admission and as needed per policy Notes: Orientation to the Wound Care Program Nursing Diagnoses: Knowledge deficit related to the wound healing center program GoalsISMAEL, Jaime Zuniga ([id number]) Patient/caregiver will verbalize understanding of the Wound Healing Center Program Date Initiated: 02/11/2016 Goal Status: Active Interventions: Provide education on orientation to the wound center Notes: Pain, Acute or Chronic Nursing Diagnoses: Pain, acute or chronic: actual or potential Potential alteration in comfort, pain Goals: Patient will verbalize adequate pain control and receive pain control interventions during procedures as needed Date Initiated: 02/11/2016 Goal Status: Active Patient/caregiver will verbalize adequate pain  control between visits Date Initiated: 02/11/2016 Goal Status: Active Interventions: Assess comfort goal upon admission Complete pain assessment as per visit requirements Notes: Soft Tissue Infection Nursing Diagnoses: Impaired tissue integrity Goals: Patient/caregiver will verbalize understanding of or measures to prevent infection and contamination in the home setting Date Initiated: 02/11/2016 Goal Status: Active Patient's soft tissue infection will resolve Date Initiated: 02/11/2016 Goal Status: Active Interventions: Assess signs and symptoms of infection every visit Jaime Zuniga, Jaime Zuniga ([id number]) Notes: Wound/Skin Impairment Nursing Diagnoses: Impaired tissue integrity Knowledge deficit related to smoking impact on wound healing Goals: Patient will demonstrate a reduced rate of smoking or cessation of smoking Date Initiated: 02/11/2016 Goal Status: Active Ulcer/skin breakdown will have a volume reduction of 30% by week 4 Date Initiated: 02/11/2016 Goal Status: Active Ulcer/skin breakdown will have a volume reduction of 50% by week 8 Date Initiated: 02/11/2016 Goal Status: Active Ulcer/skin breakdown will have a volume reduction of 80% by week 12 Date Initiated: 02/11/2016 Goal Status: Active Interventions: Assess ulceration(s) every visit Notes: Electronic Signature(s) Signed: 06/08/2016 5:20:58 PM By: Jaime Eric BSN, Jaime Zuniga Entered By: Jaime Eric on 06/08/2016 13:54:17 Jaime Zuniga ([id number]) -------------------------------------------------------------------------------- Pain Assessment Details Patient Name: Jaime Zuniga Date of Service: 06/08/2016 1:30 PM Medical Record  Number: [id number] Patient Account Number: [id number] Date of Birth/Sex: [birth year](65 y.o. Male) Treating Jaime Zuniga: Phillis HaggisPinkerton, Debi Primary Care Physician: Evelene CroonNiemeyer, Meindert Other Clinician: Referring Physician: Evelene CroonNiemeyer, Meindert Treating Physician/Extender: Rudene ReBritto, Errol Weeks in Treatment: 16 Active  Problems Location of Pain Severity and Description of Pain Patient Has Paino No Site Locations With Dressing Change: No Pain Management and Medication Current Pain Management: Electronic Signature(s) Signed: 06/08/2016 4:59:43 PM By: Alejandro MullingPinkerton, Debra Entered By: Alejandro MullingPinkerton, Debra on 06/08/2016 13:43:54 Jaime MaclachlanBURTON, Jaime Zuniga ([id number]) -------------------------------------------------------------------------------- Patient/Caregiver Education Details Patient Name: Jaime MaclachlanBURTON, Jaime Zuniga Date of Service: 06/08/2016 1:30 PM Medical Record Number: [id number] Patient Account Number: [id number] Date of Birth/Gender: [birth year](65 y.o. Male) Treating Jaime Zuniga: Phillis HaggisPinkerton, Debi Primary Care Physician: Evelene CroonNiemeyer, Meindert Other Clinician: Referring Physician: Evelene CroonNiemeyer, Meindert Treating Physician/Extender: Rudene ReBritto, Errol Weeks in Treatment: 16 Education Assessment Education Provided To: Patient and Caregiver Education Topics Provided Wound/Skin Impairment: Handouts: Other: change dressing as ordered Methods: Demonstration, Explain/Verbal Responses: State content correctly Electronic Signature(s) Signed: 06/08/2016 4:59:43 PM By: Alejandro MullingPinkerton, Debra Entered By: Alejandro MullingPinkerton, Debra on 06/08/2016 13:59:59 Kareem, Jaime Zuniga ([id number]) -------------------------------------------------------------------------------- Wound Assessment Details Patient Name: Jaime MaclachlanBURTON, Jaime Zuniga Date of Service: 06/08/2016 1:30 PM Medical Record Number: [id number] Patient Account Number: [id number] Date of Birth/Sex: [birth year] (65 y.o. Male) Treating Jaime Zuniga: Phillis HaggisPinkerton, Debi Primary Care Physician: Evelene CroonNiemeyer, Meindert Other Clinician: Referring Physician: Evelene CroonNiemeyer, Meindert Treating Physician/Extender: Rudene ReBritto, Errol Weeks in Treatment: 16 Wound Status Wound Number: 2 Primary Venous Leg Ulcer Etiology: Wound Location: Left Lower Leg - Anterior, Distal Wound Open Status: Wounding Event: Gradually Appeared Comorbid Chronic Obstructive Pulmonary  Disease Date Acquired: 01/26/2016 History: (COPD), Hypertension, Myocardial Weeks Of Treatment: 16 Infarction, Osteoarthritis, Dementia Clustered Wound: No Photos Photo Uploaded By: Alejandro MullingPinkerton, Debra on 06/08/2016 14:04:39 Wound Measurements Length: (cm) 0.3 Width: (cm) 0.3 Depth: (cm) 0.1 Area: (cm) 0.071 Volume: (cm) 0.007 % Reduction in Area: 94% % Reduction in Volume: 97% Epithelialization: None Tunneling: No Undermining: No Wound Description Full Thickness Without Exposed Foul Odor Afte Classification: Support Structures Exudate Large Amount: Exudate Type: Serosanguineous Exudate Color: red, brown r Cleansing: No Wound Bed Granulation Amount: Large (67-100%) Exposed Structure Granulation Quality: Red, Pink Fascia Exposed: No Necrotic Amount: None Present (0%) Fat Layer Exposed: No Colmenares, Jaime Zuniga ([id number]) Tendon Exposed: No Muscle Exposed: No Joint Exposed: No Bone Exposed: No Limited to Skin Breakdown Periwound Skin Texture Texture Color No Abnormalities Noted: No No Abnormalities Noted: No Moisture Temperature / Pain No Abnormalities Noted: No Temperature: No Abnormality Moist: Yes Tenderness on Palpation: Yes Wound Preparation Ulcer Cleansing: Rinsed/Irrigated with Saline Topical Anesthetic Applied: Other: lidocaine 4%, Treatment Notes Wound #2 (Left, Distal, Anterior Lower Leg) 1. Cleansed with: Clean wound with Normal Saline 2. Anesthetic Topical Lidocaine 4% cream to wound bed prior to debridement 3. Peri-wound Care: Skin Prep 4. Dressing Applied: Prisma Ag 5. Secondary Dressing Applied Bordered Foam Dressing Electronic Signature(s) Signed: 06/08/2016 4:59:43 PM By: Alejandro MullingPinkerton, Debra Entered By: Alejandro MullingPinkerton, Debra on 06/08/2016 13:49:02 Jaime MaclachlanBURTON, [place] ([id number]) -------------------------------------------------------------------------------- Vitals Details Patient Name: Jaime MaclachlanBURTON, Saliou Date of Service: 06/08/2016 1:30 PM Medical Record  Number: [id number] Patient Account Number: [id number] Date of Birth/Sex: [birth year] (65 y.o. Male) Treating Jaime Zuniga: Phillis HaggisPinkerton, Debi Primary Care Physician: Evelene CroonNiemeyer, Meindert Other Clinician: Referring Physician: Evelene CroonNiemeyer, Meindert Treating Physician/Extender: Rudene ReBritto, Errol Weeks in Treatment: 16 Vital Signs Time Taken: 13:45 Temperature (F): 98.1 Height (in): 72 Pulse (bpm): 84 Weight (lbs): 210 Respiratory Rate (breaths/min): 18 Body Mass Index (BMI): 28.5 Blood Pressure (mmHg): 101/60 Reference Range: 80 - 120 mg / dl Electronic Signature(s) Signed: 06/08/2016 4:59:43 PM By: Alejandro MullingPinkerton, Debra Entered By: Ashok CordiaPinkerton,  Debra on 06/08/2016 13:45:29

## 2016-06-09 NOTE — Progress Notes (Signed)
Jaime Zuniga, Trigger ([id number]) Visit Report for 06/08/2016 Chief Complaint Document Details Patient Name: Jaime Zuniga, Jaime Zuniga Date of Service: 06/08/2016 1:30 PM Medical Record Number: [id number] Patient Account Number: [id number] Date of Birth/Sex: 20-Dec-1950 (65 y.o. 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 Male) Treating RN: Phillis HaggisPinkerton, Debi Primary Care Physician: Evelene CroonNiemeyer, Meindert Other Clinician: Referring Physician: Evelene CroonNiemeyer, Meindert Treating Physician/Extender: Rudene ReBritto, Isatou Agredano Weeks in Treatment: 16 History of Present Illness Location: left lower extremity anterior shin area Quality: Patient reports experiencing a dull pain to affected area(s). Severity: Patient states wound are getting worse. Duration: Patient has had the wound for > 3 months prior to seeking treatment at the wound center Timing: Pain in wound is Intermittent (comes and goes Context: The wound appeared gradually over time Modifying Factors: Other treatment(s) tried include:Augmentin and doxycycline Associated Signs and Symptoms: Patient reports having increase swelling. HPI Description: 65 year old gentleman was being seen in the ER  recently for wounds on his left lower extremity which have been draining and there is swelling and he was concerned about wound infection. in the ER x-ray of the left leg was done which showed postsurgical changes but no acute findings.he wrapped his leg in and Unna's boots and referred him to the wound center. earlier in June he had a DVT study done of the left lower extremity which showed no evidence of DVT but there was edema seen. no reflux was seen on that study. in June he was seen by a surgeon Dr. Lemar LivingsByrnett who advised symptomatic treatment. the patient was [facility] he has recently completed courses of Augmentin and doxycycline. 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 02/24/2016 -- seen by Dr. Kirke CorinArida on 02/17/2016. Lower extremity arterial studies were done which showed a normal ABI bilaterally and normal great toe pressures. Duplex showed diffuse nonobstructive disease and a three-vessel runoff bilaterally. No further vascular studies or intervention was recommended. He was seen by Dr. Romilda JoyMohamed Arida on 02/17/2016 and recommended to continue with wound care and no vascular studies intervention was recommended. Electronic Signature(s) Signed: 06/08/2016 2:04:21 PM By: Evlyn KannerBritto, Tyrena Gohr MD, FACS Entered By: Evlyn KannerBritto, Kyon Bentler on 06/08/2016 14:04:21 Jaime Zuniga, Jaime Zuniga ([id number]) -------------------------------------------------------------------------------- Physical Exam Details Patient Name: Jaime Zuniga, Jaime Zuniga Date of Service: 06/08/2016 1:30 PM Medical Record Number: [id number] Patient Account Number: [id number] Date of Birth/Sex: 20-Dec-1950 51(65 y.o. Male) Treating RN: Phillis HaggisPinkerton, Debi Primary Care Physician: Evelene CroonNiemeyer, Meindert Other Clinician: Referring Physician: Evelene CroonNiemeyer, Meindert Treating Physician/Extender: Rudene ReBritto, Osby Sweetin Weeks in Treatment:  16 Constitutional . Pulse regular. Respirations normal and unlabored. Afebrile. . Eyes Nonicteric. Reactive to light. Ears, Nose, Mouth, and Throat Lips, teeth, and gums WNL.Marland Kitchen. Moist mucosa without lesions. Neck supple and nontender. No palpable supraclavicular or cervical adenopathy. Normal sized without goiter. Respiratory WNL. No retractions.. Cardiovascular Pedal Pulses WNL. No clubbing, cyanosis or edema. Lymphatic  No adneopathy. No adenopathy. No adenopathy. Musculoskeletal Adexa without tenderness or enlargement.. Digits and nails w/o clubbing, cyanosis, infection, petechiae, ischemia, or inflammatory conditions.. Integumentary (Hair, Skin) No suspicious lesions. No crepitus or fluctuance. No peri-wound warmth or erythema. No masses.Marland Kitchen Psychiatric Judgement and insight Intact.. No evidence of depression, anxiety, or agitation.. Notes the wound is much smaller and overall improved significantly and no sharp debridement was required today. Electronic Signature(s) Signed: 06/08/2016 2:05:16 PM By: Evlyn Kanner MD, FACS Entered By: Evlyn Kanner on 06/08/2016 14:05:16 Jaime Maclachlan ([id number]) -------------------------------------------------------------------------------- Physician Orders Details Patient Name: Jaime Maclachlan Date of Service: 06/08/2016 1:30 PM Medical Record Number: [id number] Patient Account Number: [id number] Date of Birth/Sex: 1950-12-23 (65 y.o. Male) Treating RN: Clover Mealy, RN, BSN, [place] Primary Care Physician: Evelene Croon Other Clinician: Referring Physician: Evelene Croon Treating Physician/Extender: Rudene Re in Treatment: 8 Verbal / Phone Orders: Yes Clinician: Afful, RN, BSN, Rita Read Back and Verified: Yes Diagnosis Coding Wound Cleansing Wound #2 Left,Distal,Anterior Lower Leg o Clean wound with Normal Saline. o Cleanse wound with mild soap and water o May Shower, gently pat wound dry prior to applying new  dressing. Anesthetic Wound #2 Left,Distal,Anterior Lower Leg o Topical Lidocaine 4% cream applied to wound bed prior to debridement - for clinic use Skin Barriers/Peri-Wound Care Wound #2 Left,Distal,Anterior Lower Leg o Barrier cream Primary Wound Dressing Wound #2 Left,Distal,Anterior Lower Leg o Prisma Ag Secondary Dressing Wound #2 Left,Distal,Anterior Lower Leg o Boardered Foam Dressing - dry gauze Dressing Change Frequency Wound #2 Left,Distal,Anterior Lower Leg o Change dressing every other day. Follow-up Appointments Wound #2 Left,Distal,Anterior Lower Leg o Return Appointment in 1 week. Edema Control Wound #2 Left,Distal,Anterior Lower Leg o Patient to wear own compression stockings - DO NOT WRAP PATIENT'S LEG o Elevate legs to the level of the heart and pump ankles as often as possible Fross, Blanchard ([id number]) o Support Garment 20-30 mm/Hg pressure to: - HOME HEALTH TO PLEASE ORDER STOCKINGS FOR PATIENT. Additional Orders / Instructions Wound #2 Left,Distal,Anterior Lower Leg o Stop Smoking o Increase protein intake. Home Health Wound #2 Left,Distal,Anterior Lower Leg o Continue Home Health Visits - [facility] - DO NOT WRAP PATIENT'S LEG o Home Health Nurse may visit PRN to address patientos wound care needs. - HOME HEALTH TO PLEASE ORDER COMPRESSION STOCKINGS o FACE TO FACE ENCOUNTER: MEDICARE and MEDICAID PATIENTS: I certify that this patient is under my care and that I had a face-to-face encounter that meets the physician face-to-face encounter requirements with this patient on this date. The encounter with the patient was in whole or in part for the following MEDICAL CONDITION: (primary reason for Home Healthcare) MEDICAL NECESSITY: I certify, that based on my findings, NURSING services are a medically necessary home health service. HOME BOUND STATUS: I certify that my clinical findings support that this patient is homebound (i.e., Due  to illness or injury, pt requires aid of supportive devices such as crutches, cane, wheelchairs, walkers, the use of special transportation or the assistance of another person to leave their place of residence. There is a normal inability to leave the home and doing so requires considerable and taxing effort. Other absences are for medical reasons / religious services and are infrequent or of short duration when for other reasons). o If current dressing causes regression in wound condition, may D/C ordered dressing product/s and apply Normal Saline Moist Dressing daily until next Wound Healing Center / Other MD appointment. Notify Wound Healing Center of regression in wound condition at [phone]. o  Please direct any NON-WOUND related issues/requests for orders to patient's Primary Care Physician Electronic Signature(s) Signed: 06/08/2016 4:11:15 PM By: Evlyn KannerBritto, Kaylyn Garrow MD, FACS Signed: 06/08/2016 5:20:58 PM By: Elpidio EricAfful, Rita BSN, RN Entered By: Elpidio EricAfful, Rita on 06/08/2016 13:54:41 Jaime Zuniga, Kaileb ([id number]) -------------------------------------------------------------------------------- Problem List Details Patient Name: Jaime Zuniga, Jaime Zuniga Date of Service: 06/08/2016 1:30 PM Medical Record Number: [id number] Patient Account Number: [id number] Date of Birth/Sex: 04/20/1951 (65 y.o. 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 M61.462 Other calcification of muscle, left lower leg 02/11/2016 Yes I73.9 Peripheral vascular disease, unspecified 02/11/2016 Yes I89.0 Lymphedema, not elsewhere classified 04/20/2016 Yes Inactive Problems Resolved Problems Electronic Signature(s) Signed:  06/08/2016 2:04:03 PM By: Evlyn KannerBritto, Kelechi Orgeron MD, FACS Entered By: Evlyn KannerBritto, Dakotah Orrego on 06/08/2016 14:04:02 Jaime Zuniga, Jaime Zuniga ([id number]) -------------------------------------------------------------------------------- Progress Note Details Patient Name: Jaime Zuniga, Jaime Zuniga Date of Service: 06/08/2016 1:30 PM Medical Record Number: [id number] Patient Account Number: [id number] Date of Birth/Sex: 04/20/1951 [age] y.o. 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 Severity: Patient states wound are getting worse. Duration: Patient has had the wound for > 3 months prior to seeking treatment at the wound center Timing: Pain in wound is Intermittent (comes and goes Context: The wound appeared gradually over time Modifying Factors: Other treatment(s) tried include:Augmentin and doxycycline Associated Signs and Symptoms: Patient reports having increase swelling. 65 year old gentleman was being seen in the ER recently for wounds on his left lower extremity which have been draining and there is swelling and he was concerned about wound infection. in the ER x-ray of the left leg was done which showed postsurgical changes but no acute findings.he wrapped his leg in and Unna's boots and referred him to the wound center. earlier in June he had a DVT study done of the left lower extremity which showed no evidence of DVT but there  was edema seen. no reflux was seen on that study. in June he was seen by a surgeon Dr. Lemar LivingsByrnett who advised symptomatic treatment. the [facility] was [facility] he has recently completed courses of Augmentin and doxycycline. 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 02/24/2016 -- seen by Dr. Kirke CorinArida on 02/17/2016. Lower extremity arterial studies were done which showed a normal ABI bilaterally and normal great toe pressures. Duplex showed diffuse nonobstructive disease and a three-vessel runoff bilaterally. No further vascular studies or intervention was recommended. He was seen by Dr. Romilda JoyMohamed Arida on 02/17/2016 and recommended to continue with wound care and no vascular studies intervention was recommended. JasperBURTON, Irene LimboSAMMY ([id number]) Objective Constitutional Pulse regular. Respirations normal and unlabored. Afebrile. Vitals Time Taken: 1:45 PM, Height: 72 in, Weight: 210 lbs, BMI: 28.5, Temperature: 98.1 F, Pulse: 84 bpm, Respiratory Rate:  18 breaths/min, Blood Pressure: 101/60 mmHg. Eyes Nonicteric. Reactive to light. Ears, Nose, Mouth, and Throat Lips, teeth, and gums WNL.Marland Kitchen Moist mucosa without lesions. Neck supple and nontender. No palpable supraclavicular or cervical adenopathy. Normal sized without goiter. Respiratory WNL. No retractions.. Cardiovascular Pedal Pulses WNL. No clubbing, cyanosis or edema. Lymphatic No adneopathy. No adenopathy. No adenopathy. Musculoskeletal Adexa without tenderness or enlargement.. Digits and nails w/o clubbing, cyanosis, infection, petechiae, ischemia, or inflammatory conditions.Marland Kitchen Psychiatric Judgement and insight Intact.. No evidence of depression, anxiety, or agitation.. General Notes: the wound is much smaller and overall improved significantly and no sharp debridement was required today. Integumentary  (Hair, Skin) No suspicious lesions. No crepitus or fluctuance. No peri-wound warmth or erythema. No masses.. Wound #2 status is Open. Original cause of wound was Gradually Appeared. The wound is located on the [facility] Lower Leg. The wound measures 0.3cm length x 0.3cm width x 0.1cm depth; 0.071cm^2 area and 0.007cm^3 volume. The wound is limited to skin breakdown. There is no tunneling or undermining noted. There is a large amount of serosanguineous drainage noted. There is large (67-100%) red, pink granulation within the wound bed. There is no necrotic tissue within the wound bed. The periwound skin appearance exhibited: Moist. Periwound temperature was noted as No Abnormality. The periwound has tenderness on palpation. JAIREN, GOLDFARB ([id number]) Assessment Active Problems ICD-10 [phone] - Non-pressure chronic ulcer of left calf with fat layer exposed M61.462 - Other calcification of muscle, left lower leg I73.9 - Peripheral vascular disease, unspecified I89.0 - Lymphedema, not elsewhere classified Plan Wound Cleansing: Wound #2 Left,Distal,Anterior Lower Leg: Clean wound with Normal Saline. Cleanse wound with mild soap and water May Shower, gently pat wound dry prior to applying new dressing. Anesthetic: Wound #2 Left,Distal,Anterior Lower Leg: Topical Lidocaine 4% cream applied to wound bed prior to debridement - for clinic use Skin Barriers/Peri-Wound Care: Wound #2 Left,Distal,Anterior Lower Leg: Barrier cream Primary Wound Dressing: Wound #2 Left,Distal,Anterior Lower Leg: Prisma Ag Secondary Dressing: Wound #2 Left,Distal,Anterior Lower Leg: Boardered Foam Dressing - dry gauze Dressing Change Frequency: Wound #2 Left,Distal,Anterior Lower Leg: Change dressing every other day. Follow-up Appointments: Wound #2 Left,Distal,Anterior Lower Leg: Return Appointment in 1 week. Edema Control: Wound #2 Left,Distal,Anterior Lower Leg: Patient to wear own compression  stockings - DO NOT WRAP PATIENT'S LEG Elevate legs to the level of the heart and pump ankles as often as possible Support Garment 20-30 mm/Hg pressure to: - HOME HEALTH TO PLEASE ORDER STOCKINGS FOR PATIENT. Additional Orders / Instructions: Wound #2 Left,Distal,Anterior Lower Leg: SAVIAN, MAZON ([id number]) Stop Smoking Increase protein intake. Home Health: Wound #2 Left,Distal,Anterior Lower Leg: Continue Home Health Visits - [facility] - DO NOT WRAP PATIENT'S LEG Home Health Nurse may visit PRN to address patient s wound care needs. - HOME HEALTH TO PLEASE ORDER COMPRESSION STOCKINGS FACE TO FACE ENCOUNTER: MEDICARE and MEDICAID PATIENTS: I certify that this patient is under my care and that I had a face-to-face encounter that meets the physician face-to-face encounter requirements with this patient on this date. The encounter with the patient was in whole or in part for the following MEDICAL CONDITION: (primary reason for Home Healthcare) MEDICAL NECESSITY: I certify, that based on my findings, NURSING services are a medically necessary home health service. HOME BOUND STATUS: I certify that my clinical findings support that this patient is homebound (i.e., Due to illness or injury, pt requires aid of supportive devices such as crutches, cane, wheelchairs, walkers, the use of special transportation or the assistance of another  person to leave their place of residence. There is a normal inability to leave the home and doing so requires considerable and taxing effort. Other absences are for medical reasons / religious services and are infrequent or of short duration when for other reasons). If current dressing causes regression in wound condition, may D/C ordered dressing product/s and apply Normal Saline Moist Dressing daily until next Wound Healing Center / Other MD appointment. Notify Wound Healing Center of regression in wound condition at [phone]. Please direct any NON-WOUND  related issues/requests for orders to patient's Primary Care Physician I have again urged him to completely give up smoking. We will continue with [employer] locally and he is to see me back next week. Electronic Signature(s) Signed: 06/08/2016 2:05:58 PM By: Evlyn Kanner MD, FACS Entered By: Evlyn Kanner on 06/08/2016 14:05:58 Jaime Maclachlan ([id number]) -------------------------------------------------------------------------------- SuperBill Details Patient Name: Jaime Maclachlan Date of Service: 06/08/2016 Medical Record Number: [id number] Patient Account Number: [id number] Date of Birth/Sex: 1950/08/31 (65 y.o. Male) Treating RN: Phillis Haggis Primary Care Physician: Evelene Croon Other Clinician: Referring Physician: Evelene Croon Treating Physician/Extender: Rudene Re in Treatment: 16 Diagnosis Coding ICD-10 Codes Code Description [phone] Non-pressure chronic ulcer of left calf with fat layer exposed M61.462 Other calcification of muscle, left lower leg I73.9 Peripheral vascular disease, unspecified I89.0 Lymphedema, not elsewhere classified Facility Procedures CPT4 Code: [id number] Description: [phone] - WOUND CARE VISIT-LEV 2 EST PT Modifier: Quantity: 1 Physician Procedures CPT4 Code: [id number] Description: 99213 - WC PHYS LEVEL 3 - EST PT ICD-10 Description Diagnosis L97.222 Non-pressure chronic ulcer of left calf with fat M61.462 Other calcification of muscle, left lower leg I73.9 Peripheral vascular disease, unspecified I89.0 Lymphedema, not  elsewhere classified Modifier: layer exposed Quantity: 1 Electronic Signature(s) Signed: 06/08/2016 2:06:14 PM By: Evlyn Kanner MD, FACS Entered By: Evlyn Kanner on 06/08/2016 14:06:13

## 2016-06-15 ENCOUNTER — Encounter: Payer: Medicare Other | Admitting: Surgery

## 2016-06-15 DIAGNOSIS — L97222 Non-pressure chronic ulcer of left calf with fat layer exposed: Secondary | ICD-10-CM | POA: Diagnosis not present

## 2016-06-15 NOTE — Progress Notes (Addendum)
TYLEEK, SMICK ([id number]) Visit Report for 06/15/2016 Arrival Information Details Patient Name: Jaime Zuniga, Jaime Zuniga Date of Service: 06/15/2016 10:45 AM Medical Record Number: [id number] Patient Account Number: [id number] Date of Birth/Sex: Dec 05, 1950 (65 y.o. Male) Treating RN: Clover Mealy, RN, BSN, [place]ink Primary Care Physician: Evelene Croon Other Clinician: Referring Physician: Evelene Croon Treating Physician/Extender: Rudene Re in Treatment: 17 Visit Information History Since Last Visit All ordered tests and consults were completed: No Patient Arrived: Jaime Zuniga Added or deleted any medications: No Arrival Time: 10:49 Any new allergies or adverse reactions: No Accompanied By: caregiver Had a fall or experienced change in No Transfer Assistance: None activities of daily living that may affect Patient Identification Verified: Yes risk of falls: Secondary Verification Process Yes Signs or symptoms of abuse/neglect since last No Completed: visito Patient Requires Transmission-Based No Hospitalized since last visit: No Precautions: Has Dressing in Place as Prescribed: Yes Patient Has Alerts: No Pain Present Now: No Electronic Signature(s) Signed: 06/15/2016 10:49:38 AM By: Elpidio Eric BSN, RN Entered By: Elpidio Eric on 06/15/2016 10:49:38 Jaime Zuniga ([id number]) -------------------------------------------------------------------------------- Clinic Level of Care Assessment Details Patient Name: Jaime Zuniga Date of Service: 06/15/2016 10:45 AM Medical Record Number: [id number] Patient Account Number: [id number] Date of Birth/Sex: [birth year] (65 y.o. Male) Treating RN: Clover Mealy, RN, BSN, Rita Primary Care Physician: Evelene Croon Other Clinician: Referring Physician: Evelene Croon Treating Physician/Extender: Rudene Re in Treatment: 17 Clinic Level of Care Assessment Items TOOL 4 Quantity Score []  - Use when only an EandM is performed on FOLLOW-UP  visit 0 ASSESSMENTS - Nursing Assessment / Reassessment X - Reassessment of Co-morbidities (includes updates in patient status) 1 10 X - Reassessment of Adherence to Treatment Plan 1 5 ASSESSMENTS - Wound and Skin Assessment / Reassessment X - Simple Wound Assessment / Reassessment - one wound 1 5 []  - Complex Wound Assessment / Reassessment - multiple wounds 0 []  - Dermatologic / Skin Assessment (not related to wound area) 0 ASSESSMENTS - Focused Assessment []  - Circumferential Edema Measurements - multi extremities 0 []  - Nutritional Assessment / Counseling / Intervention 0 X - Lower Extremity Assessment (monofilament, tuning fork, pulses) 1 5 []  - Peripheral Arterial Disease Assessment (using hand held doppler) 0 ASSESSMENTS - Ostomy and/or Continence Assessment and Care []  - Incontinence Assessment and Management 0 []  - Ostomy Care Assessment and Management (repouching, etc.) 0 PROCESS - Coordination of Care X - Simple Patient / Family Education for ongoing care 1 15 []  - Complex (extensive) Patient / Family Education for ongoing care 0 []  - Staff obtains Chiropractor, Records, Test Results / Process Orders 0 []  - Staff telephones HHA, Nursing Homes / Clarify orders / etc 0 []  - Routine Transfer to another Facility (non-emergent condition) 0 Ventresca, Jaime Zuniga ([id number]) []  - Routine Hospital Admission (non-emergent condition) 0 []  - New Admissions / Manufacturing engineer / Ordering NPWT, Apligraf, etc. 0 []  - Emergency Hospital Admission (emergent condition) 0 []  - Simple Discharge Coordination 0 []  - Complex (extensive) Discharge Coordination 0 PROCESS - Special Needs []  - Pediatric / Minor Patient Management 0 []  - Isolation Patient Management 0 []  - Hearing / Language / Visual special needs 0 []  - Assessment of Community assistance (transportation, D/C planning, etc.) 0 []  - Additional assistance / Altered mentation 0 []  - Support Surface(s) Assessment (bed, cushion, seat, etc.)  0 INTERVENTIONS - Wound Cleansing / Measurement X - Simple Wound Cleansing - one wound 1 5 []  - Complex Wound Cleansing - multiple wounds 0 X - Wound Imaging (photographs -  any number of wounds) 1 5 []  - Wound Tracing (instead of photographs) 0 X - Simple Wound Measurement - one wound 1 5 []  - Complex Wound Measurement - multiple wounds 0 INTERVENTIONS - Wound Dressings X - Small Wound Dressing one or multiple wounds 1 10 []  - Medium Wound Dressing one or multiple wounds 0 []  - Large Wound Dressing one or multiple wounds 0 []  - Application of Medications - topical 0 []  - Application of Medications - injection 0 INTERVENTIONS - Miscellaneous []  - External ear exam 0 [place]redge, Ezell ([id number]) []  - Specimen Collection (cultures, biopsies, blood, body fluids, etc.) 0 []  - Specimen(s) / Culture(s) sent or taken to Lab for analysis 0 []  - Patient Transfer (multiple staff / Michiel SitesHoyer Lift / Similar devices) 0 []  - Simple Staple / Suture removal (25 or less) 0 []  - Complex Staple / Suture removal (26 or more) 0 []  - Hypo / Hyperglycemic Management (close monitor of Blood Glucose) 0 []  - Ankle / Brachial Index (ABI) - do not check if billed separately 0 X - Vital Signs 1 5 Has the patient been seen at the hospital within the last three years: Yes Total Score: 70 Level Of Care: New/Established - Level 2 Electronic Signature(s) Signed: 06/15/2016 5:10:52 PM By: Elpidio EricAfful, Rita BSN, RN Entered By: Elpidio EricAfful, Rita on 06/15/2016 11:11:53 Jaime MaclachlanBURTON, Jaime Zuniga ([id number]) -------------------------------------------------------------------------------- Encounter Discharge Information Details Patient Name: Jaime MaclachlanBURTON, Jaime Zuniga Date of Service: 06/15/2016 10:45 AM Medical Record Number: [id number] Patient Account Number: [id number] Date of Birth/Sex: 1950-11-21 58(65 y.o. Male) Treating RN: Clover MealyAfful, RN, BSN, [place] Primary Care Physician: Evelene CroonNiemeyer, Meindert Other Clinician: Referring Physician: Evelene CroonNiemeyer, Meindert Treating  Physician/Extender: Rudene ReBritto, Errol Weeks in Treatment: 17 Encounter Discharge Information Items Discharge Pain Level: 0 Discharge Condition: Stable Ambulatory Status: Walker Discharge Destination: Home Private Transportation: Auto Accompanied By: caregiver Schedule Follow-up Appointment: No Medication Reconciliation completed and No provided to Patient/Care Ludger Bones: Clinical Summary of Care: Electronic Signature(s) Signed: 06/15/2016 5:10:52 PM By: Elpidio EricAfful, Rita BSN, RN Previous Signature: 06/15/2016 11:12:43 AM Version By: Gwenlyn PerkingMoore, Shelia Entered By: Elpidio EricAfful, Rita on 06/15/2016 11:12:57 Jaime MaclachlanBURTON, Jaime Zuniga ([id number]) -------------------------------------------------------------------------------- Lower Extremity Assessment Details Patient Name: Jaime MaclachlanBURTON, Jaime Zuniga Date of Service: 06/15/2016 10:45 AM Medical Record Number: [id number] Patient Account Number: [id number] Date of Birth/Sex: 1950-11-21 46(65 y.o. Male) Treating RN: Clover MealyAfful, RN, BSN, Rita Primary Care Physician: Evelene CroonNiemeyer, Meindert Other Clinician: Referring Physician: Evelene CroonNiemeyer, Meindert Treating Physician/Extender: Rudene ReBritto, Errol Weeks in Treatment: 17 Vascular Assessment Pulses: Posterior Tibial Dorsalis Pedis Palpable: [Left:Yes] Extremity colors, hair growth, and conditions: Extremity Color: [Left:Hyperpigmented] Hair Growth on Extremity: [Left:No] Temperature of Extremity: [Left:Warm] Capillary Refill: [Left:< 3 seconds] Toe Nail Assessment Left: Right: Thick: Yes Discolored: Yes Deformed: No Improper Length and Hygiene: Yes Electronic Signature(s) Signed: 06/15/2016 10:50:10 AM By: Elpidio EricAfful, Rita BSN, RN Entered By: Elpidio EricAfful, Rita on 06/15/2016 10:50:10 Jaime MaclachlanBURTON, Jaime Zuniga ([id number]) -------------------------------------------------------------------------------- Multi Wound Chart Details Patient Name: Jaime MaclachlanBURTON, Jaime Zuniga Date of Service: 06/15/2016 10:45 AM Medical Record Number: [id number] Patient Account Number: [id number] Date of  Birth/Sex: 1950-11-21 56(65 y.o. 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] [N/A:N/A] Comorbid History: [2:Chronic Obstructive Pulmonary Disease (COPD), Hypertension, Myocardial Infarction, Osteoarthritis, Dementia] [N/A:N/A] Date Acquired: [  2:01/26/2016] [N/A:N/A] Weeks of Treatment: [2:17] [N/A:N/A] Wound Status: [2:Open] [N/A:N/A] Measurements L x W x D 0.3x0.3x0.1 [N/A:N/A] (cm) Area (cm) : [2:0.071] [N/A:N/A] Volume (cm) : [2:0.007] [N/A:N/A] % Reduction in Area: [2:94.00%] [N/A:N/A] % Reduction in Volume: 97.00% [N/A:N/A] Classification: [2:Full Thickness Without Exposed Support Structures] [N/A:N/A] Exudate Amount: [2:Large] [N/A:N/A] Exudate Type: [2:Serosanguineous] [N/A:N/A] Exudate Color: [2:red, brown] [N/A:N/A] Wound Margin: [2:Distinct, outline attached] [N/A:N/A] Granulation Amount: [2:Large (67-100%)] [N/A:N/A] Granulation Quality: [2:Red, Pink] [N/A:N/A] Necrotic Amount: [2:None Present (0%)] [N/A:N/A] Exposed Structures: [N/A:N/A] Fascia: No Fat: No Tendon: No Muscle: No Joint: No Bone: No Limited to Skin Breakdown Epithelialization: None N/A N/A Periwound Skin Texture: No Abnormalities Noted N/A N/A Periwound Skin Moist: Yes N/A N/A Moisture: Periwound Skin Color: No Abnormalities Noted N/A N/A Temperature: No Abnormality N/A N/A Tenderness on No N/A N/A Palpation: Wound Preparation: Ulcer Cleansing: N/A N/A Rinsed/Irrigated  with Saline Topical Anesthetic Applied: None Assessment Notes: Patient has excoriations N/A N/A and tape burn to the periwound. Treatment Notes Electronic Signature(s) Signed: 06/15/2016 5:10:52 PM By: Elpidio Eric BSN, RN Entered By: Elpidio Eric on 06/15/2016 11:07:05 Jaime Zuniga ([id number]) -------------------------------------------------------------------------------- Multi-Disciplinary Care Plan Details Patient Name: Jaime Zuniga Date of Service: 06/15/2016 10:45 AM Medical Record Number: [id number] Patient Account Number: [id number] Date of Birth/Sex: 1951/05/05 (65 y.o. Male) Treating RN: Clover Mealy, RN, BSN, [place]ink Primary Care Physician: Evelene Croon Other Clinician: Referring Physician: Evelene Croon Treating Physician/Extender: Rudene Re in Treatment: 61 Active Inactive Abuse / Safety / Falls / Self Care Management Nursing Diagnoses: Potential for falls Goals: Patient will remain injury free Date Initiated: 02/11/2016 Goal Status: Active Interventions: Assess fall risk on admission and as needed Assess self care needs on admission and as needed Notes: Nutrition Nursing Diagnoses: Imbalanced nutrition Goals: Patient/caregiver agrees to and verbalizes understanding of need to use nutritional supplements and/or vitamins as prescribed Date Initiated: 02/11/2016 Goal Status: Active Interventions: Assess patient nutrition upon admission and as needed per policy Notes: Orientation to the Wound Care Program Nursing Diagnoses: Knowledge deficit related to the wound healing center program GoalsAHMED, Jaime Zuniga ([id number]) Patient/caregiver will verbalize understanding of the Wound Healing Center Program Date Initiated: 02/11/2016 Goal Status: Active Interventions: Provide education on orientation to the wound center Notes: Pain, Acute or Chronic Nursing Diagnoses: Pain, acute or chronic: actual or potential Potential alteration in comfort,  pain Goals: Patient will verbalize adequate pain control and receive pain control interventions during procedures as needed Date Initiated: 02/11/2016 Goal Status: Active Patient/caregiver will verbalize adequate pain control between visits Date Initiated: 02/11/2016 Goal Status: Active Interventions: Assess comfort goal upon admission Complete pain assessment as per visit requirements Notes: Soft Tissue Infection Nursing Diagnoses: Impaired tissue integrity Goals: Patient/caregiver will verbalize understanding of or measures to prevent infection and contamination in the home setting Date Initiated: 02/11/2016 Goal Status: Active Patient's soft tissue infection will resolve Date Initiated: 02/11/2016 Goal Status: Active Interventions: Assess signs and symptoms of infection every visit Jaime Zuniga, Jaime Zuniga ([id number]) Notes: Wound/Skin Impairment Nursing Diagnoses: Impaired tissue integrity Knowledge deficit related to smoking impact on wound healing Goals: Patient will demonstrate a reduced rate of smoking or cessation of smoking Date Initiated: 02/11/2016 Goal Status: Active Ulcer/skin breakdown will have a volume reduction of 30% by week 4 Date Initiated: 02/11/2016 Goal Status: Active Ulcer/skin breakdown will have a volume reduction of 50% by week 8 Date Initiated: 02/11/2016 Goal Status: Active Ulcer/skin breakdown will have a volume reduction of 80% by week 12 Date Initiated: 02/11/2016 Goal Status: Active Interventions: Assess ulceration(s) every visit  Notes: Electronic Signature(s) Signed: 06/15/2016 5:10:52 PM By: Elpidio Eric BSN, RN Entered By: Elpidio Eric on 06/15/2016 11:06:58 Jaime Zuniga ([id number]) -------------------------------------------------------------------------------- Pain Assessment Details Patient Name: Jaime Zuniga Date of Service: 06/15/2016 10:45 AM Medical Record Number: [id number] Patient Account Number: [id number] Date of Birth/Sex: May 06, 1951 (65 y.o.  Male) Treating RN: Clover Mealy, RN, BSN, Rita Primary Care Physician: Evelene Croon Other Clinician: Referring Physician: Evelene Croon Treating Physician/Extender: Rudene Re in Treatment: 17 Active Problems Location of Pain Severity and Description of Pain Patient Has Paino No Site Locations With Dressing Change: No Pain Management and Medication Current Pain Management: Electronic Signature(s) Signed: 06/15/2016 10:49:45 AM By: Elpidio Eric BSN, RN Entered By: Elpidio Eric on 06/15/2016 10:49:44 Jaime Zuniga ([id number]) -------------------------------------------------------------------------------- Patient/Caregiver Education Details Patient Name: Jaime Zuniga Date of Service: 06/15/2016 10:45 AM Medical Record Number: [id number] Patient Account Number: [id number] Date of Birth/Gender: 07-06-1951 (65 y.o. Male) Treating RN: Clover Mealy, RN, BSN, Rita Primary Care Physician: Evelene Croon Other Clinician: Referring Physician: Evelene Croon Treating Physician/Extender: Rudene Re in Treatment: 17 Education Assessment Education Provided To: Patient Education Topics Provided Welcome To The Wound Care Center: Methods: Explain/Verbal Responses: State content correctly Wound/Skin Impairment: Methods: Explain/Verbal Responses: State content correctly Electronic Signature(s) Signed: 06/15/2016 5:10:52 PM By: Elpidio Eric BSN, RN Entered By: Elpidio Eric on 06/15/2016 11:13:11 Jaime Zuniga ([id number]) -------------------------------------------------------------------------------- Wound Assessment Details Patient Name: Jaime Zuniga Date of Service: 06/15/2016 10:45 AM Medical Record Number: [id number] Patient Account Number: [id number] Date of Birth/Sex: [birth year] (65 y.o. Male) Treating RN: Clover Mealy, RN, BSN, Rita Primary Care Physician: Evelene Croon Other Clinician: Referring Physician: Evelene Croon Treating Physician/Extender: Rudene Re in Treatment: 17 Wound Status Wound Number: 2 Primary Venous Leg Ulcer Etiology: Wound Location: Left Lower Leg - Anterior, Distal Wound Open Status: Wounding Event: Gradually Appeared Comorbid Chronic Obstructive Pulmonary Disease Date Acquired: 01/26/2016 History: (COPD), Hypertension, Myocardial Weeks Of Treatment: 17 Infarction, Osteoarthritis, Dementia Clustered Wound: No Photos Photo Uploaded By: Elpidio Eric on 06/15/2016 17:08:53 Wound Measurements Length: (cm) 0.3 Width: (cm) 0.3 Depth: (cm) 0.1 Area: (cm) 0.071 Volume: (cm) 0.007 % Reduction in Area: 94% % Reduction in Volume: 97% Epithelialization: None Tunneling: No Undermining: No Wound Description Full Thickness Without Exposed Classification: Support Structures Wound Margin: Distinct, outline attached Exudate Large Amount: Exudate Type: Serosanguineous Exudate Color: red, brown Foul Odor After Cleansing: No Wound Bed Granulation Amount: Large (67-100%) Exposed Structure Granulation Quality: Red, Pink Fascia Exposed: No Keidel, Jaime Zuniga ([id number]) Necrotic Amount: None Present (0%) Fat Layer Exposed: No Tendon Exposed: No Muscle Exposed: No Joint Exposed: No Bone Exposed: No Limited to Skin Breakdown Periwound Skin Texture Texture Color No Abnormalities Noted: No No Abnormalities Noted: No Moisture Temperature / Pain No Abnormalities Noted: No Temperature: No Abnormality Moist: Yes Wound Preparation Ulcer Cleansing: Rinsed/Irrigated with Saline Topical Anesthetic Applied: None Assessment Notes Patient has excoriations and tape burn to the periwound. Treatment Notes Wound #2 (Left, Distal, Anterior Lower Leg) 1. Cleansed with: Clean wound with Normal Saline 4. Dressing Applied: Prisma Ag 5. Secondary Dressing Applied Gauze and Kerlix/Conform 7. Secured with Secretary/administrator) Signed: 06/15/2016 5:10:52 PM By: Elpidio Eric BSN, RN Entered By: Elpidio Eric on  06/15/2016 11:06:49 Jaime Zuniga ([id number]) -------------------------------------------------------------------------------- Vitals Details Patient Name: Jaime Zuniga Date of Service: 06/15/2016 10:45 AM Medical Record Number: [id number] Patient Account Number: [id number] Date of Birth/Sex: Jul 13, 1951 (65 y.o. Male) Treating RN: Clover Mealy, RN, BSN, [place] Sink Primary Care Physician: Evelene Croon Other Clinician: Referring Physician: Evelene Croon Treating Physician/Extender: Rudene Re in Treatment:  17 Vital Signs Time Taken: 10:52 Temperature (F): 97.9 Height (in): 72 Pulse (bpm): 70 Weight (lbs): 210 Respiratory Rate (breaths/min): 17 Body Mass Index (BMI): 28.5 Blood Pressure (mmHg): 103/67 Reference Range: 80 - 120 mg / dl Electronic Signature(s) Signed: 06/15/2016 5:10:52 PM By: Elpidio EricAfful, Rita BSN, RN Entered By: Elpidio EricAfful, Rita on 06/15/2016 10:53:52

## 2016-06-16 NOTE — Progress Notes (Signed)
JOANDRY, SLAGTER ([id number]) Visit Report for 06/15/2016 Chief Complaint Document Details Patient Name: Jaime Zuniga, Jaime Zuniga Date of Service: 06/15/2016 10:45 AM Medical Record Number: [id number] Patient Account Number: [id number] Date of Birth/Sex: 06-Jan-1951 (65 y.o. 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 Male) Treating RN: Clover Mealy, RN, BSN, [place] Sink Primary Care Physician: Evelene Croon Other Clinician: Referring Physician: Evelene Croon Treating Physician/Extender: Rudene Re in Treatment: 17 History of Present Illness Location: left lower extremity anterior shin area Quality: Patient reports experiencing a dull pain to affected area(s). Severity: Patient states wound are getting worse. Duration: Patient has had the wound for > 3 months prior to seeking treatment at the wound center Timing: Pain in wound is Intermittent (comes and goes Context: The wound appeared gradually over time Modifying Factors: Other treatment(s) tried include:Augmentin and doxycycline Associated Signs and Symptoms: Patient reports having increase swelling. HPI Description: 64 year old gentleman was being seen in  the ER recently for wounds on his left lower extremity which have been draining and there is swelling and he was concerned about wound infection. in the ER x-ray of the left leg was done which showed postsurgical changes but no acute findings.he wrapped his leg in and Unna's boots and referred him to the wound center. earlier in June he had a DVT study done of the left lower extremity which showed no evidence of DVT but there was edema seen. no reflux was seen on that study. in June he was seen by a surgeon Dr. Lemar Livings who advised symptomatic treatment. the patient was also seen at the [facility] he has recently completed courses of Augmentin and doxycycline. 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 02/24/2016 -- seen by Dr. Kirke Corin on 02/17/2016. Lower extremity arterial studies were done which showed a normal ABI bilaterally and normal great toe pressures. Duplex showed diffuse nonobstructive disease and a three-vessel runoff bilaterally. No further vascular studies or intervention was recommended. He was seen by Dr. Romilda Joy on 02/17/2016 and recommended to continue with wound care and no vascular studies intervention was recommended. Electronic Signature(s) Signed: 06/15/2016 11:10:58 AM By: Evlyn Kanner MD, FACS Entered By: Evlyn Kanner on 06/15/2016 11:10:58 Jaime Zuniga ([id number]) -------------------------------------------------------------------------------- Physical Exam Details Patient Name: Jaime Zuniga Date of Service: 06/15/2016 10:45 AM Medical Record Number: [id number] Patient Account Number: [id number] Date of Birth/Sex: [birth year] (65 y.o. Male) Treating RN: Clover Mealy, RN, BSN, [place] Sink Primary Care Physician: Evelene Croon Other Clinician: Referring Physician: Evelene Croon Treating Physician/Extender: Rudene Re in Treatment:  17 Constitutional . Pulse regular. Respirations normal and unlabored. Afebrile. . Eyes Nonicteric. Reactive to light. Ears, Nose, Mouth, and Throat Lips, teeth, and gums WNL.Marland Kitchen Moist mucosa without lesions. Neck supple and nontender. No palpable supraclavicular or cervical adenopathy. Normal sized without goiter. Respiratory WNL. No retractions.. Cardiovascular Pedal Pulses WNL.  No clubbing, cyanosis or edema. Lymphatic No adneopathy. No adenopathy. No adenopathy. Musculoskeletal Adexa without tenderness or enlargement.. Digits and nails w/o clubbing, cyanosis, infection, petechiae, ischemia, or inflammatory conditions.. Integumentary (Hair, Skin) No suspicious lesions. No crepitus or fluctuance. No peri-wound warmth or erythema. No masses.Marland Kitchen. Psychiatric Judgement and insight Intact.. No evidence of depression, anxiety, or agitation.. Notes the wound continues to get better and is more superficial. He has some tape burns surrounding and these are fairly superficial and more of an excoriation. Electronic Signature(s) Signed: 06/15/2016 11:11:32 AM By: Evlyn KannerBritto, Lottie Sigman MD, FACS Entered By: Evlyn KannerBritto, Delrose Rohwer on 06/15/2016 11:11:31 Jaime MaclachlanBURTON, Jaime ([id number]) -------------------------------------------------------------------------------- Physician Orders Details Patient Name: Jaime MaclachlanBURTON, Jaime Zuniga Date of Service: 06/15/2016 10:45 AM Medical Record Number: [id number] Patient Account Number: [id number] Date of Birth/Sex: [birth year](65 y.o. Male) Treating RN: Clover MealyAfful, RN, BSN, [place] Sinkita Primary Care Physician: Evelene CroonNiemeyer, Meindert Other Clinician: Referring Physician: Evelene CroonNiemeyer, Meindert Treating Physician/Extender: Rudene ReBritto, Leonce Bale Weeks in Treatment: 4717 Verbal / Phone Orders: Yes Clinician: Afful, RN, BSN, Rita Read Back and Verified: Yes Diagnosis Coding Wound Cleansing Wound #2 Left,Distal,Anterior Lower Leg o Clean wound with Normal Saline. o Cleanse wound with mild soap and water o May Shower,  gently pat wound dry prior to applying new dressing. Anesthetic Wound #2 Left,Distal,Anterior Lower Leg o Topical Lidocaine 4% cream applied to wound bed prior to debridement - for clinic use Skin Barriers/Peri-Wound Care Wound #2 Left,Distal,Anterior Lower Leg o Barrier cream Primary Wound Dressing Wound #2 Left,Distal,Anterior Lower Leg o Prisma Ag Secondary Dressing Wound #2 Left,Distal,Anterior Lower Leg o Gauze and Kerlix/Conform Dressing Change Frequency Wound #2 Left,Distal,Anterior Lower Leg o Change dressing every other day. Follow-up Appointments Wound #2 Left,Distal,Anterior Lower Leg o Return Appointment in 1 week. Edema Control Wound #2 Left,Distal,Anterior Lower Leg o Patient to wear own compression stockings - DO NOT WRAP PATIENT'S LEG o Elevate legs to the level of the heart and pump ankles as often as possible Zuniga, Jaime ([id number]) o Support Garment 20-30 mm/Hg pressure to: - HOME HEALTH TO PLEASE ORDER STOCKINGS FOR PATIENT. Additional Orders / Instructions Wound #2 Left,Distal,Anterior Lower Leg o Stop Smoking o Increase protein intake. Home Health Wound #2 Left,Distal,Anterior Lower Leg o Continue Home Health Visits - [facility] - DO NOT WRAP PATIENT'S LEG o Home Health Nurse may visit PRN to address patientos wound care needs. - HOME HEALTH TO PLEASE ORDER COMPRESSION STOCKINGS o FACE TO FACE ENCOUNTER: MEDICARE and MEDICAID PATIENTS: I certify that this patient is under my care and that I had a face-to-face encounter that meets the physician face-to-face encounter requirements with this patient on this date. The encounter with the patient was in whole or in part for the following MEDICAL CONDITION: (primary reason for Home Healthcare) MEDICAL NECESSITY: I certify, that based on my findings, NURSING services are a medically necessary home health service. HOME BOUND STATUS: I certify that my clinical findings support that this  patient is homebound (i.e., Due to illness or injury, pt requires aid of supportive devices such as crutches, cane, wheelchairs, walkers, the use of special transportation or the assistance of another person to leave their place of residence. There is a normal inability to leave the home and doing so requires considerable and taxing effort. Other absences are for medical reasons / religious services and are infrequent or of short duration when for other reasons). o If current dressing causes regression in wound condition, may D/C ordered dressing product/s and apply Normal Saline Moist Dressing daily until next Wound Healing Center / Other MD  appointment. Notify Wound Healing Center of regression in wound condition at [phone]. o Please direct any NON-WOUND related issues/requests for orders to patient's Primary Care Physician Electronic Signature(s) Signed: 06/15/2016 5:08:19 PM By: Evlyn Kanner MD, FACS Signed: 06/15/2016 5:10:52 PM By: Elpidio Eric BSN, RN Entered By: Elpidio Eric on 06/15/2016 11:07:38 Jaime Zuniga ([id number]) -------------------------------------------------------------------------------- Problem List Details Patient Name: Jaime Zuniga Date of Service: 06/15/2016 10:45 AM Medical Record Number: [id number] Patient Account Number: [id number] Date of Birth/Sex: [birth year] (65 y.o. 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 M61.462 Other calcification of muscle, left lower leg 02/11/2016 Yes I73.9 Peripheral vascular disease, unspecified 02/11/2016 Yes I89.0 Lymphedema, not elsewhere classified 04/20/2016 Yes Inactive Problems Resolved  Problems Electronic Signature(s) Signed: 06/15/2016 11:10:42 AM By: Evlyn Kanner MD, FACS Entered By: Evlyn Kanner on 06/15/2016 11:10:42 Jaime Zuniga ([id number]) -------------------------------------------------------------------------------- Progress Note Details Patient Name: Jaime Zuniga Date of Service: 06/15/2016 10:45 AM Medical Record Number: [id number] Patient Account Number: [id number] Date of Birth/Sex: 12-12-1950 (65 y.o. 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 Severity: Patient states wound are getting worse. Duration: Patient has had the wound for > 3 months prior to seeking treatment at the wound center Timing: Pain in wound is Intermittent (comes and goes Context: The wound appeared gradually over time Modifying Factors: Other treatment(s) tried include:Augmentin and doxycycline Associated Signs and Symptoms: Patient reports having increase swelling. 65 year old gentleman was being seen in the ER recently for wounds on his left lower extremity which have been draining and there is swelling and he was concerned about wound infection. in the ER x-ray of the left leg was done which showed postsurgical changes but no acute findings.he wrapped his leg in and Unna's boots and referred him to the wound center. earlier in June he had a DVT study done of the left lower  extremity which showed no evidence of DVT but there was edema seen. no reflux was seen on that study. in June he was seen by a surgeon Dr. Lemar Livings who advised symptomatic treatment. the patient was also seen at the [facility] he has recently completed courses of Augmentin and doxycycline. 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 02/24/2016 -- seen by Dr. Kirke Corin on 02/17/2016. Lower extremity arterial studies were done which showed a normal ABI bilaterally and normal great toe pressures. Duplex showed diffuse nonobstructive disease and a three-vessel runoff bilaterally. No further vascular studies or intervention was recommended. He was seen by Dr. Romilda Joy on 02/17/2016 and recommended to continue with wound care and no vascular studies intervention was recommended. Zuniga, Jaime Zuniga ([id number]) Objective Constitutional Pulse regular. Respirations normal and unlabored. Afebrile. Vitals Time Taken: 10:52  AM, Height: 72 in, Weight: 210 lbs, BMI: 28.5, Temperature: 97.9 F, Pulse: 70 bpm, Respiratory Rate: 17 breaths/min, Blood Pressure: 103/67 mmHg. Eyes Nonicteric. Reactive to light. Ears, Nose, Mouth, and Throat Lips, teeth, and gums WNL.Marland Kitchen Moist mucosa without lesions. Neck supple and nontender. No palpable supraclavicular or cervical adenopathy. Normal sized without goiter. Respiratory WNL. No retractions.. Cardiovascular Pedal Pulses WNL. No clubbing, cyanosis or edema. Lymphatic No adneopathy. No adenopathy. No adenopathy. Musculoskeletal Adexa without tenderness or enlargement.. Digits and nails w/o clubbing, cyanosis, infection, petechiae, ischemia, or inflammatory conditions.Marland Kitchen Psychiatric Judgement and insight Intact.. No evidence of depression, anxiety, or agitation.. General Notes: the wound continues to get better and is more superficial. He has some  tape burns surrounding and these are fairly superficial and more of an excoriation. Integumentary (Hair, Skin) No suspicious lesions. No crepitus or fluctuance. No peri-wound warmth or erythema. No masses.. Wound #2 status is Open. Original cause of wound was Gradually Appeared. The wound is located on the [facility] Lower Leg. The wound measures 0.3cm length x 0.3cm width x 0.1cm depth; 0.071cm^2 area and 0.007cm^3 volume. The wound is limited to skin breakdown. There is no tunneling or undermining noted. There is a large amount of serosanguineous drainage noted. The wound margin is distinct with the outline attached to the wound base. There is large (67-100%) red, pink granulation within the wound bed. There is no necrotic tissue within the wound bed. The periwound skin appearance exhibited: Moist. Periwound temperature was noted as No Abnormality. Jaime Zuniga, Jaime Zuniga ([id number]) General Notes: Patient has excoriations and tape burn to the periwound. Assessment Active Problems ICD-10 L97.222 - Non-pressure chronic ulcer of left calf with fat layer exposed M61.462 - Other calcification of muscle, left lower leg I73.9 - Peripheral vascular disease, unspecified I89.0 - Lymphedema, not elsewhere classified Plan Wound Cleansing: Wound #2 Left,Distal,Anterior Lower Leg: Clean wound with Normal Saline. Cleanse wound with mild soap and water May Shower, gently pat wound dry prior to applying new dressing. Anesthetic: Wound #2 Left,Distal,Anterior Lower Leg: Topical Lidocaine 4% cream applied to wound bed prior to debridement - for clinic use Skin Barriers/Peri-Wound Care: Wound #2 Left,Distal,Anterior Lower Leg: Barrier cream Primary Wound Dressing: Wound #2 Left,Distal,Anterior Lower Leg: Prisma Ag Secondary Dressing: Wound #2 Left,Distal,Anterior Lower Leg: Gauze and Kerlix/Conform Dressing Change Frequency: Wound #2 Left,Distal,Anterior Lower Leg: Change dressing every other  day. Follow-up Appointments: Wound #2 Left,Distal,Anterior Lower Leg: Return Appointment in 1 week. Edema Control: Wound #2 Left,Distal,Anterior Lower Leg: Patient to wear own compression stockings - DO NOT WRAP PATIENT'S LEG Elevate legs to the level of the heart and pump ankles as often as possible Support Garment 20-30 mm/Hg pressure to: [facility] TO [address] [place], [place] ([id number]) PATIENT. Additional Orders / Instructions: Wound #2 Left,Distal,Anterior Lower Leg: Stop Smoking Increase protein intake. Home Health: Wound #2 Left,Distal,Anterior Lower Leg: Continue Home Health Visits - [facility] - DO NOT WRAP PATIENT'S LEG Home Health Nurse may visit PRN to address patient s wound care needs. - HOME HEALTH TO PLEASE ORDER COMPRESSION STOCKINGS FACE TO FACE ENCOUNTER: MEDICARE and MEDICAID PATIENTS: I certify that this patient is under my care and that I had a face-to-face encounter that meets the physician face-to-face encounter requirements with this patient on this date. The encounter with the patient was in whole or in part for the following MEDICAL CONDITION: (primary reason for Home Healthcare) MEDICAL NECESSITY: I certify, that based on my findings, NURSING services are a medically necessary home health service. HOME  BOUND STATUS: I certify that my clinical findings support that this patient is homebound (i.e., Due to illness or injury, pt requires aid of supportive devices such as crutches, cane, wheelchairs, walkers, the use of special transportation or the assistance of another person to leave their place of residence. There is a normal inability to leave the home and doing so requires considerable and taxing effort. Other absences are for medical reasons / religious services and are infrequent or of short duration when for other reasons). If current dressing causes regression in wound condition, may D/C ordered dressing product/s and apply Normal  Saline Moist Dressing daily until next Wound Healing Center / Other MD appointment. Notify Wound Healing Center of regression in wound condition at [phone]. Please direct any NON-WOUND related issues/requests for orders to patient's Primary Care Physician In view of the tape burns I have recommended wrapping the wound with Kerlix and gauze and not using a bordered foam. I have again urged him to completely give up smoking. We will continue with [employer] locally and he is to see me back next week. Electronic Signature(s) Signed: 06/15/2016 11:12:23 AM By: Evlyn KannerBritto, Pasty Manninen MD, FACS Entered By: Evlyn KannerBritto, Xitlally Mooneyham on 06/15/2016 11:12:23 Jaime MaclachlanBURTON, Graycen ([id number]) -------------------------------------------------------------------------------- SuperBill Details Patient Name: Jaime MaclachlanBURTON, Jaime Zuniga Date of Service: 06/15/2016 Medical Record Number: [id number] Patient Account Number: [id number] Date of Birth/Sex: 1950-11-12 40(65 y.o. 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 Non-pressure chronic ulcer of left calf with fat layer exposed M61.462 Other calcification of muscle, left lower leg I73.9 Peripheral vascular disease, unspecified I89.0 Lymphedema, not elsewhere classified Facility Procedures CPT4 Code: [id number] Description: [id number] - WOUND CARE VISIT-LEV 2 EST PT Modifier: Quantity: 1 Physician Procedures CPT4 Code: [id number] Description: 99213 - WC PHYS LEVEL 3 - EST PT ICD-10 Description Diagnosis L97.222 Non-pressure chronic ulcer of left calf with fat M61.462 Other calcification of muscle, left lower leg I73.9 Peripheral vascular disease, unspecified I89.0 Lymphedema, not  elsewhere classified Modifier: layer exposed Quantity: 1 Electronic Signature(s) Signed: 06/15/2016  11:22:49 AM By: Evlyn KannerBritto, Myrikal Messmer MD, FACS Entered By: Evlyn KannerBritto, Joseff Luckman on 06/15/2016 11:22:48

## 2016-06-22 ENCOUNTER — Encounter: Payer: Medicare Other | Admitting: Surgery

## 2016-06-22 DIAGNOSIS — L97222 Non-pressure chronic ulcer of left calf with fat layer exposed: Secondary | ICD-10-CM | POA: Diagnosis not present

## 2016-06-23 NOTE — Progress Notes (Signed)
KEYWON, MESTRE ([id number]) Visit Report for 06/22/2016 Arrival Information Details Patient Name: Jaime Zuniga Date of Service: 06/22/2016 10:00 AM Medical Record Number: [id number] Patient Account Number: [id number] Date of Birth/Sex: 1950-10-14 (65 y.o. Male) Treating RN: Jaime Zuniga Primary Care Physician: Jaime Zuniga Other Clinician: Referring Physician: Evelene Zuniga Treating Physician/Extender: Jaime Zuniga in Treatment: 18 Visit Information History Since Last Visit Added or deleted any medications: No Patient Arrived: Walker Any new allergies or adverse reactions: No Arrival Time: 10:06 Had a fall or experienced change in No Accompanied Zuniga: cg activities of daily living that may affect Transfer Assistance: None risk of falls: Patient Identification Verified: Yes Signs or symptoms of abuse/neglect since last No Secondary Verification Process Completed: Yes visito Patient Requires Transmission-Based No Hospitalized since last visit: No Precautions: Pain Present Now: No Patient Has Alerts: No Electronic Signature(s) Signed: 06/22/2016 5:05:14 PM Zuniga: Jaime Zuniga Entered Zuniga: Jaime Zuniga on 06/22/2016 10:08:44 Jaime Zuniga ([id number]) -------------------------------------------------------------------------------- Clinic Level of Care Assessment Details Patient Name: Jaime Zuniga Date of Service: 06/22/2016 10:00 AM Medical Record Number: [id number] Patient Account Number: [id number] Date of Birth/Sex: 21-Jan-1951 (65 y.o. Male) Treating RN: Jaime Zuniga Primary Care Physician: Jaime Zuniga Other Clinician: Referring Physician: Evelene Zuniga Treating Physician/Extender: Jaime Zuniga in Treatment: 18 Clinic Level of Care Assessment Items TOOL 4 Quantity Score []  - Use when only an EandM is performed on FOLLOW-UP visit 0 ASSESSMENTS - Nursing Assessment / Reassessment X - Reassessment of Co-morbidities (includes updates in  patient status) 1 10 X - Reassessment of Adherence to Treatment Plan 1 5 ASSESSMENTS - Wound and Skin Assessment / Reassessment X - Simple Wound Assessment / Reassessment - one wound 1 5 []  - Complex Wound Assessment / Reassessment - multiple wounds 0 []  - Dermatologic / Skin Assessment (not related to wound area) 0 ASSESSMENTS - Focused Assessment []  - Circumferential Edema Measurements - multi extremities 0 []  - Nutritional Assessment / Counseling / Intervention 0 []  - Lower Extremity Assessment (monofilament, tuning fork, pulses) 0 []  - Peripheral Arterial Disease Assessment (using hand held doppler) 0 ASSESSMENTS - Ostomy and/or Continence Assessment and Care []  - Incontinence Assessment and Management 0 []  - Ostomy Care Assessment and Management (repouching, etc.) 0 PROCESS - Coordination of Care X - Simple Patient / Family Education for ongoing care 1 15 []  - Complex (extensive) Patient / Family Education for ongoing care 0 []  - Staff obtains Chiropractor, Records, Test Results / Process Orders 0 []  - Staff telephones HHA, Nursing Homes / Clarify orders / etc 0 []  - Routine Transfer to another Facility (non-emergent condition) 0 Jaime Zuniga ([id number]) []  - Routine Hospital Admission (non-emergent condition) 0 []  - New Admissions / Manufacturing engineer / Ordering NPWT, Apligraf, etc. 0 []  - Emergency Hospital Admission (emergent condition) 0 X - Simple Discharge Coordination 1 10 []  - Complex (extensive) Discharge Coordination 0 PROCESS - Special Needs []  - Pediatric / Minor Patient Management 0 []  - Isolation Patient Management 0 []  - Hearing / Language / Visual special needs 0 []  - Assessment of Community assistance (transportation, D/C planning, etc.) 0 []  - Additional assistance / Altered mentation 0 []  - Support Surface(s) Assessment (bed, cushion, seat, etc.) 0 INTERVENTIONS - Wound Cleansing / Measurement X - Simple Wound Cleansing - one wound 1 5 []  - Complex Wound  Cleansing - multiple wounds 0 X - Wound Imaging (photographs - any number of wounds) 1 5 []  - Wound Tracing (instead of photographs) 0 X - Simple Wound Measurement - one  wound 1 5 []  - Complex Wound Measurement - multiple wounds 0 INTERVENTIONS - Wound Dressings []  - Small Wound Dressing one or multiple wounds 0 []  - Medium Wound Dressing one or multiple wounds 0 []  - Large Wound Dressing one or multiple wounds 0 []  - Application of Medications - topical 0 []  - Application of Medications - injection 0 INTERVENTIONS - Miscellaneous []  - External ear exam 0 Jaime Zuniga ([id number]) []  - Specimen Collection (cultures, biopsies, blood, body fluids, etc.) 0 []  - Specimen(s) / Culture(s) sent or taken to Lab for analysis 0 []  - Patient Transfer (multiple staff / Michiel SitesHoyer Lift / Similar devices) 0 []  - Simple Staple / Suture removal (25 or less) 0 []  - Complex Staple / Suture removal (26 or more) 0 []  - Hypo / Hyperglycemic Management (close monitor of Blood Glucose) 0 []  - Ankle / Brachial Index (ABI) - do not check if billed separately 0 X - Vital Signs 1 5 Has the patient been seen at the hospital within the last three years: Yes Total Score: 65 Level Of Care: New/Established - Level 2 Electronic Signature(s) Signed: 06/22/2016 5:05:14 PM Zuniga: Jaime Sitesorthy, Jaime Zuniga: Jaime Sitesorthy, Jaime on 06/22/2016 16:51:00 Jaime Zuniga, Cadyn ([id number]) -------------------------------------------------------------------------------- Encounter Discharge Information Details Patient Name: Jaime Zuniga, Jaime Zuniga Date of Service: 06/22/2016 10:00 AM Medical Record Number: [id number] Patient Account Number: [id number] Date of Birth/Sex: 22-May-1951 39(65 y.o. Male) Treating RN: Jaime Sitesorthy, Jaime Primary Care Physician: Jaime CroonNiemeyer, Zuniga Other Clinician: Referring Physician: Evelene CroonNiemeyer, Zuniga Treating Physician/Extender: Jaime ReBritto, Jaime Zuniga in Treatment: 1418 Encounter Discharge Information Items Discharge Pain Level:  0 Discharge Condition: Stable Ambulatory Status: Walker Discharge Destination: Home Transportation: Private Auto Accompanied Zuniga: cg Schedule Follow-up Appointment: No Medication Reconciliation completed and provided to Patient/Care No Roxie Gueye: Provided on Clinical Summary of Care: 06/22/2016 Form Type Recipient Paper Patient SB Electronic Signature(s) Signed: 06/22/2016 4:50:13 PM Zuniga: Jaime Sitesorthy, Jaime Previous Signature: 06/22/2016 10:35:36 AM Version Zuniga: Gwenlyn PerkingMoore, Shelia Entered Zuniga: Jaime Sitesorthy, Jaime on 06/22/2016 16:50:13 Jaime Zuniga, Jaime Zuniga ([id number]) -------------------------------------------------------------------------------- Lower Extremity Assessment Details Patient Name: Jaime Zuniga, Jaime Zuniga Date of Service: 06/22/2016 10:00 AM Medical Record Number: [id number] Patient Account Number: [id number] Date of Birth/Sex: 22-May-1951 (65 y.o. Male) Treating RN: Jaime Sitesorthy, Jaime Primary Care Physician: Jaime CroonNiemeyer, Zuniga Other Clinician: Referring Physician: Evelene CroonNiemeyer, Zuniga Treating Physician/Extender: Jaime ReBritto, Jaime Zuniga in Treatment: 18 Vascular Assessment Pulses: Posterior Tibial Dorsalis Pedis Palpable: [Left:Yes] Extremity colors, hair growth, and conditions: Extremity Color: [Left:Hyperpigmented] Hair Growth on Extremity: [Left:No] Temperature of Extremity: [Left:Warm] Capillary Refill: [Left:< 3 seconds] Electronic Signature(s) Signed: 06/22/2016 5:05:14 PM Zuniga: Jaime Sitesorthy, Jaime Zuniga: Jaime Sitesorthy, Jaime on 06/22/2016 10:15:32 Jaime Zuniga, Jaime Zuniga ([id number]) -------------------------------------------------------------------------------- Multi Wound Chart Details Patient Name: Jaime Zuniga, Jaime Zuniga Date of Service: 06/22/2016 10:00 AM Medical Record Number: [id number] Patient Account Number: [id number] Date of Birth/Sex: 22-May-1951 52(65 y.o. Male) Treating RN: Jaime Sitesorthy, Jaime Primary Care Physician: Jaime CroonNiemeyer, Zuniga Other Clinician: Referring Physician: Evelene CroonNiemeyer, Zuniga Treating  Physician/Extender: Jaime ReBritto, Jaime Zuniga in Treatment: 18 Vital Signs Height(in): 72 Pulse(bpm): 72 Weight(lbs): 210 Blood Pressure 112/77 (mmHg): Body Mass Index(BMI): 28 Temperature(F): 98.0 Respiratory Rate 18 (breaths/min): Photos: [N/A:N/A] Wound Location: Left Lower Leg - Anterior, N/A N/A Distal Wounding Event: Gradually Appeared N/A N/A Primary Etiology: Venous Leg Ulcer N/A N/A Comorbid History: Chronic Obstructive N/A N/A Pulmonary Disease (COPD), Hypertension, Myocardial Infarction, Osteoarthritis, Dementia Date Acquired: 01/26/2016 N/A N/A Zuniga of Treatment: 18 N/A N/A Wound Status: Healed - Epithelialized N/A N/A Measurements L x W x D 0x0x0 N/A N/A (cm) Area (cm) : 0 N/A N/A Volume (cm) : 0 N/A N/A %  Reduction in Area: 100.00% N/A N/A % Reduction in Volume: 100.00% N/A N/A Classification: Full Thickness Without N/A N/A Exposed Support Structures Exudate Amount: None Present N/A N/A Wound Margin: Distinct, outline attached N/A N/A Jaime Zuniga, Kollen ([id number]) Granulation Amount: None Present (0%) N/A N/A Necrotic Amount: None Present (0%) N/A N/A Exposed Structures: Fascia: No N/A N/A Fat: No Tendon: No Muscle: No Joint: No Bone: No Limited to Skin Breakdown Epithelialization: Large (67-100%) N/A N/A Periwound Skin Texture: No Abnormalities Noted N/A N/A Periwound Skin Moist: Yes N/A N/A Moisture: Periwound Skin Color: No Abnormalities Noted N/A N/A Temperature: No Abnormality N/A N/A Tenderness on No N/A N/A Palpation: Wound Preparation: Ulcer Cleansing: N/A N/A Rinsed/Irrigated with Saline Topical Anesthetic Applied: None Treatment Notes Electronic Signature(s) Signed: 06/22/2016 5:05:14 PM Zuniga: Jaime Sitesorthy, Jaime Zuniga: Jaime Sitesorthy, Jaime on 06/22/2016 10:27:32 Jaime Zuniga, Karin ([id number]) -------------------------------------------------------------------------------- Multi-Disciplinary Care Plan Details Patient Name: Jaime Zuniga,  Jaime Zuniga Date of Service: 06/22/2016 10:00 AM Medical Record Number: [id number] Patient Account Number: [id number] Date of Birth/Sex: [birth year](65 y.o. Male) Treating RN: Jaime Sitesorthy, Jaime Primary Care Physician: Jaime CroonNiemeyer, Zuniga Other Clinician: Referring Physician: Evelene CroonNiemeyer, Zuniga Treating Physician/Extender: Jaime ReBritto, Jaime Zuniga in Treatment: 18 Active Inactive Electronic Signature(s) Signed: 06/22/2016 5:05:14 PM Zuniga: Jaime Sitesorthy, Jaime Zuniga: Jaime Sitesorthy, Jaime on 06/22/2016 10:27:18 Jaime Zuniga, Jaime Zuniga ([id number]) -------------------------------------------------------------------------------- Pain Assessment Details Patient Name: Jaime Zuniga, Jaime Zuniga Date of Service: 06/22/2016 10:00 AM Medical Record Number: [id number] Patient Account Number: [id number] Date of Birth/Sex: [birth year] 86(65 y.o. Male) Treating RN: Jaime Sitesorthy, Jaime Primary Care Physician: Jaime CroonNiemeyer, Zuniga Other Clinician: Referring Physician: Evelene CroonNiemeyer, Zuniga Treating Physician/Extender: Jaime ReBritto, Jaime Zuniga in Treatment: 18 Active Problems Location of Pain Severity and Description of Pain Patient Has Paino No Site Locations Pain Management and Medication Current Pain Management: Notes Topical or injectable lidocaine is offered to patient for acute pain when surgical debridement is performed. If needed, Patient is instructed to use over the counter pain medication for the following 24-48 hours after debridement. Wound care MDs do not prescribed pain medications. Patient has chronic pain or uncontrolled pain. Patient has been instructed to make an appointment with their Primary Care Physician for pain management. Electronic Signature(s) Signed: 06/22/2016 5:05:14 PM Zuniga: Jaime Sitesorthy, Jaime Zuniga: Jaime Sitesorthy, Jaime on 06/22/2016 10:09:49 Jaime Zuniga, Hrishikesh ([id number]) -------------------------------------------------------------------------------- Patient/Caregiver Education Details Patient Name: Jaime Zuniga, Jaime Zuniga Date of Service:  06/22/2016 10:00 AM Medical Record Number: [id number] Patient Account Number: [id number] Date of Birth/Gender: [birth year] 18(65 y.o. Male) Treating RN: Jaime Sitesorthy, Jaime Primary Care Physician: Jaime CroonNiemeyer, Zuniga Other Clinician: Referring Physician: Evelene CroonNiemeyer, Zuniga Treating Physician/Extender: Jaime ReBritto, Jaime Zuniga in Treatment: 18 Education Assessment Education Provided To: Patient and Caregiver Education Topics Provided Basic Hygiene: Handouts: Other: skin care of newly healed ulcer site Methods: Demonstration, Explain/Verbal Responses: State content correctly Electronic Signature(s) Signed: 06/22/2016 5:05:14 PM Zuniga: Jaime Sitesorthy, Jaime Zuniga: Jaime Sitesorthy, Jaime on 06/22/2016 16:50:36 Jaime Zuniga, Ledarius ([id number]) -------------------------------------------------------------------------------- Wound Assessment Details Patient Name: Jaime Zuniga, Arron Date of Service: 06/22/2016 10:00 AM Medical Record Number: [id number] Patient Account Number: [id number] Date of Birth/Sex: [birth year] (65 y.o. Male) Treating RN: Jaime Sitesorthy, Jaime Primary Care Physician: Jaime CroonNiemeyer, Zuniga Other Clinician: Referring Physician: Evelene CroonNiemeyer, Zuniga Treating Physician/Extender: Jaime ReBritto, Jaime Zuniga in Treatment: 18 Wound Status Wound Number: 2 Primary Venous Leg Ulcer Etiology: Wound Location: Left Lower Leg - Anterior, Distal Wound Healed - Epithelialized Status: Wounding Event: Gradually Appeared Comorbid Chronic Obstructive Pulmonary Disease Date Acquired: 01/26/2016 History: (COPD), Hypertension, Myocardial Zuniga Of Treatment: 18 Infarction, Osteoarthritis, Dementia Clustered Wound: No Photos Wound Measurements Length: (cm) 0 % Reduction Width: (cm) 0 % Reduction Depth: (cm) 0 Epithelializ  Area: (cm) 0 Tunneling: Volume: (cm) 0 Undermining in Area: 100% in Volume: 100% ation: Large (67-100%) No : No Wound Description Full Thickness Without Exposed Classification: Support Structures Wound  Margin: Distinct, outline attached Exudate None Present Amount: Foul Odor After Cleansing: No Wound Bed Granulation Amount: None Present (0%) Exposed Structure Necrotic Amount: None Present (0%) Fascia Exposed: No Fat Layer Exposed: No Tendon Exposed: No Muscle Exposed: No Prettyman, Caley ([id number]) Joint Exposed: No Bone Exposed: No Limited to Skin Breakdown Periwound Skin Texture Texture Color No Abnormalities Noted: No No Abnormalities Noted: No Moisture Temperature / Pain No Abnormalities Noted: No Temperature: No Abnormality Moist: Yes Wound Preparation Ulcer Cleansing: Rinsed/Irrigated with Saline Topical Anesthetic Applied: None Electronic Signature(s) Signed: 06/22/2016 5:05:14 PM Zuniga: Jaime Zuniga Entered Zuniga: Jaime Zuniga on 06/22/2016 10:26:53 Jaime Zuniga ([id number]) -------------------------------------------------------------------------------- Vitals Details Patient Name: Jaime Zuniga Date of Service: 06/22/2016 10:00 AM Medical Record Number: [id number] Patient Account Number: [id number] Date of Birth/Sex: 18-Mar-1951 (65 y.o. Male) Treating RN: Jaime Zuniga Primary Care Physician: Jaime Zuniga Other Clinician: Referring Physician: Evelene Zuniga Treating Physician/Extender: Jaime Zuniga in Treatment: 18 Vital Signs Time Taken: 10:09 Temperature (F): 98.0 Height (in): 72 Pulse (bpm): 72 Weight (lbs): 210 Respiratory Rate (breaths/min): 18 Body Mass Index (BMI): 28.5 Blood Pressure (mmHg): 112/77 Reference Range: 80 - 120 mg / dl Electronic Signature(s) Signed: 06/22/2016 5:05:14 PM Zuniga: Jaime Zuniga Entered Zuniga: Jaime Zuniga on 06/22/2016 10:10:45

## 2016-06-23 NOTE — Progress Notes (Signed)
JANUEL, DOOLAN ([id number]) Visit Report for 06/22/2016 Chief Complaint Document Details Patient Name: Jaime Zuniga, Jaime Zuniga Date of Service: 06/22/2016 10:00 AM Medical Record Number: [id number] Patient Account Number: [id number] Date of Birth/Sex: 1950/12/28 (65 y.o. 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 Male) Treating RN: Curtis Sites Primary Care Physician: Evelene Croon Other Clinician: Referring Physician: Evelene Croon Treating Physician/Extender: Rudene Re in Treatment: 18 History of Present Illness Location: left lower extremity anterior shin area Quality: Patient reports experiencing a dull pain to affected area(s). Severity: Patient states wound are getting worse. Duration: Patient has had the wound for > 3 months prior to seeking treatment at the wound center Timing: Pain in wound is Intermittent (comes and goes Context: The wound appeared gradually over time Modifying Factors: Other treatment(s) tried include:Augmentin and doxycycline Associated Signs and Symptoms: Patient reports having increase swelling. HPI Description: 65 year old gentleman was being seen in the ER  recently for wounds on his left lower extremity which have been draining and there is swelling and he was concerned about wound infection. in the ER x-ray of the left leg was done which showed postsurgical changes but no acute findings.he wrapped his leg in and Unna's boots and referred him to the wound center. earlier in June he had a DVT study done of the left lower extremity which showed no evidence of DVT but there was edema seen. no reflux was seen on that study. in June he was seen by a surgeon Dr. Lemar Livings who advised symptomatic treatment. the patient was also seen at the [facility] he has recently completed courses of Augmentin and doxycycline. 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 02/24/2016 -- seen by Dr. Kirke Corin on 02/17/2016. Lower extremity arterial studies were done which showed a normal ABI bilaterally and normal great toe pressures. Duplex showed diffuse nonobstructive disease and a three-vessel runoff bilaterally. No further vascular studies or intervention was recommended. He was seen by Dr. Romilda Joy on 02/17/2016 and recommended to continue with wound care and no vascular studies intervention was recommended. Electronic Signature(s) Signed: 06/22/2016 10:36:08 AM By: Evlyn Kanner MD, FACS Entered By: Evlyn Kanner on 06/22/2016 10:36:08 Jaime Zuniga ([id number]) -------------------------------------------------------------------------------- Physical Exam Details Patient Name: Jaime Zuniga Date of Service: 06/22/2016 10:00 AM Medical Record Number: [id number] Patient Account Number: [id number] Date of Birth/Sex: 1951/04/28 (65 y.o. Male) Treating RN: Curtis Sites Primary Care Physician: Evelene Croon Other Clinician: Referring Physician: Evelene Croon Treating Physician/Extender: Rudene Re in Treatment:  18 Constitutional . Pulse regular. Respirations normal and unlabored. Afebrile. . Eyes Nonicteric. Reactive to light. Ears, Nose, Mouth, and Throat Lips, teeth, and gums WNL.Marland Kitchen Moist mucosa without lesions. Neck supple and nontender. No palpable supraclavicular or cervical adenopathy. Normal sized without goiter. Respiratory WNL. No retractions.. Cardiovascular Pedal Pulses WNL. No clubbing, cyanosis or edema. Lymphatic  No adneopathy. No adenopathy. No adenopathy. Musculoskeletal Adexa without tenderness or enlargement.. Digits and nails w/o clubbing, cyanosis, infection, petechiae, ischemia, or inflammatory conditions.. Integumentary (Hair, Skin) No suspicious lesions. No crepitus or fluctuance. No peri-wound warmth or erythema. No masses.Marland Kitchen. Psychiatric Judgement and insight Intact.. No evidence of depression, anxiety, or agitation.. Notes wound is completely healed there is no cellulitis and no open areas. Electronic Signature(s) Signed: 06/22/2016 10:36:29 AM By: Evlyn KannerBritto, Farouk Vivero MD, FACS Entered By: Evlyn KannerBritto, Nirvan Laban on 06/22/2016 10:36:29 Jaime Zuniga, Jaime Zuniga ([id number]) -------------------------------------------------------------------------------- Physician Orders Details Patient Name: Jaime Zuniga, Jaime Zuniga Date of Service: 06/22/2016 10:00 AM Medical Record Number: [id number] Patient Account Number: [id number] Date of Birth/Sex: June 21, 1951 29(65 y.o. Male) Treating RN: Curtis Sitesorthy, Joanna Primary Care Physician: Evelene CroonNiemeyer, Meindert Other Clinician: Referring Physician: Evelene CroonNiemeyer, Meindert Treating Physician/Extender: Rudene ReBritto, Kara Melching Weeks in Treatment: 4218 Verbal / Phone Orders: Yes Clinician: Curtis Sitesorthy, Joanna Read Back and Verified: Yes Diagnosis Coding Home Health o Continue Home Health Visits - [facility] please evaluate newly healed ulcer site for 2 weeks to ensure full healing and closure and [facility] if any issues arise at [phone] then [facility] Discharge from Wound Care Center Electronic Signature(s) Signed: 06/22/2016 4:11:06 PM By: Evlyn KannerBritto, Keaghan Staton MD, FACS Signed: 06/22/2016 5:05:14 PM By: Curtis Sitesorthy, Joanna Entered By: Curtis Sitesorthy, Joanna on 06/22/2016 10:29:02 Jaime Zuniga, Jaime Zuniga ([id number]) -------------------------------------------------------------------------------- Problem List Details Patient Name: Jaime Zuniga, Jaime Zuniga Date of Service: 06/22/2016 10:00 AM Medical Record Number: [id number] Patient Account Number: [id number] Date of Birth/Sex: June 21, 1951 30(65 y.o. 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 M61.462 Other calcification of muscle, left lower leg 02/11/2016 Yes I73.9 Peripheral vascular disease, unspecified 02/11/2016 Yes I89.0 Lymphedema, not elsewhere classified 04/20/2016 Yes Inactive Problems Resolved Problems Electronic Signature(s) Signed: 06/22/2016 10:35:59 AM By: Evlyn KannerBritto, Suleyman Ehrman MD, FACS Entered By: Evlyn KannerBritto, Yuridiana Formanek on 06/22/2016 10:35:58 Jaime Zuniga, Jaime Zuniga ([id number]) -------------------------------------------------------------------------------- Progress Note Details Patient Name: Jaime Zuniga, Jaime Zuniga Date of Service: 06/22/2016 10:00 AM Medical Record Number: [id number] Patient Account Number: [id number] Date of Birth/Sex: June 21, 1951 38(65 y.o. Male) Treating RN: Curtis Sitesorthy, Joanna Primary Care Physician: Evelene CroonNiemeyer, Meindert Other Clinician: Referring Physician: Evelene CroonNiemeyer, Meindert Treating Physician/Extender: Rudene ReBritto, Edee Nifong Weeks in Treatment: 18 Subjective Chief Complaint Information obtained from Patient Patient visit today for follow-up of arterial ulceration to his left anterior lower leg where he's had  previous injury and this has been there for several months History of Present Illness (HPI) The following HPI elements were documented for the patient's wound: Location: left lower extremity anterior shin area Quality: Patient reports experiencing a dull pain to affected area(s). Severity: Patient states wound are getting worse. Duration: Patient has had the wound for > 3 months prior to seeking treatment at the wound center Timing: Pain in wound is Intermittent (comes and goes Context: The wound appeared gradually over time Modifying Factors: Other treatment(s) tried include:Augmentin and doxycycline Associated Signs and Symptoms: Patient reports having increase swelling. [age] year old gentleman was being seen in the ER recently for wounds on his left lower extremity which have been draining and there is swelling and he was concerned about wound infection. in the ER x-ray of the left leg was done which showed postsurgical changes but no acute findings.he wrapped his leg in and Unna's boots and referred him to the wound center. earlier in  June he had a DVT study done of the left lower extremity which showed no evidence of DVT but there was edema seen. no reflux was seen on that study. in June he was seen by a surgeon Dr. Lemar LivingsByrnett who advised symptomatic treatment. [facility] he has recently completed courses of Augmentin and doxycycline. 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 02/24/2016 -- seen by Dr. Kirke CorinArida on 02/17/2016. Lower extremity arterial studies were done which showed a normal ABI bilaterally and normal great toe pressures. Duplex showed diffuse nonobstructive disease and a three-vessel runoff bilaterally. No further vascular studies or intervention was recommended. He was seen by Dr. Romilda JoyMohamed Arida on 02/17/2016 and recommended to  continue with wound care and no vascular studies intervention was recommended. [place], Jaime Zuniga ([id number]) Objective Constitutional Pulse regular. Respirations normal and unlabored. Afebrile. Vitals Time Taken: 10:09 AM, Height: 72 in, Weight: 210 lbs, BMI: 28.5, Temperature: 98.0 F, Pulse: 72 bpm, Respiratory Rate: 18 breaths/min, Blood Pressure: 112/77 mmHg. Eyes Nonicteric. Reactive to light. Ears, Nose, Mouth, and Throat Lips, teeth, and gums WNL.Marland Kitchen. Moist mucosa without lesions. Neck supple and nontender. No palpable supraclavicular or cervical adenopathy. Normal sized without goiter. Respiratory WNL. No retractions.. Cardiovascular Pedal Pulses WNL. No clubbing, cyanosis or edema. Lymphatic No adneopathy. No adenopathy. No adenopathy. Musculoskeletal Adexa without tenderness or enlargement.. Digits and nails w/o clubbing, cyanosis, infection, petechiae, ischemia, or inflammatory conditions.Marland Kitchen. Psychiatric Judgement and insight Intact.. No evidence of depression, anxiety, or agitation.. General Notes: wound is completely healed there is no cellulitis and no open areas. Integumentary (Hair, Skin) No suspicious lesions. No crepitus or fluctuance. No peri-wound warmth or erythema. No masses.. Wound #2 status is Healed - Epithelialized. Original cause of wound was Gradually Appeared. The wound [facility],Anterior Lower Leg. The wound measures 0cm length x 0cm width x 0cm depth; 0cm^2 area and 0cm^3 volume. The wound is limited to skin breakdown. There is no tunneling or undermining noted. There is a none present amount of drainage noted. The wound margin is distinct with the outline attached to the wound base. There is no granulation within the wound bed. There is no necrotic tissue within the wound bed. The periwound skin appearance exhibited: Moist. Periwound temperature was noted as No Abnormality. Jaime Zuniga, Jaime Zuniga ([id number]) Assessment Active  Problems ICD-10 [phone] - Non-pressure chronic ulcer of left calf with fat layer exposed M61.462 - Other calcification of muscle, left lower leg I73.9 - Peripheral vascular disease, unspecified I89.0 - Lymphedema, not elsewhere classified Plan [facility] Health[facility] newly healed ulcer site for 2 weeks to ensure full healing and closure and [facility] if any issues arise at [phone] then [facility]: Discharge from Wound Care Center The wounds have completely healed and I have asked this to be protected with some foam and a Kerlix gauze lightly applied. We have urged him to return only if there is drainage or any open ulceration. He is also urged to stop smoking completely. I have discharged him from the wound care services and will see him back only if needed Electronic Signature(s) Signed: 06/22/2016 10:37:18 AM By: Evlyn KannerBritto, Nancy Manuele MD, FACS Entered By: Evlyn KannerBritto, Ardie Dragoo on 06/22/2016 10:37:18 Jaime Zuniga, Quamir ([id number]) -------------------------------------------------------------------------------- SuperBill Details Patient Name: Jaime Zuniga, Yamil Date  of Service: 06/22/2016 Medical Record Number: [id number] Patient Account Number: [id number] Date of Birth/Sex: Jan 30, 1951 (65 y.o. Male) Treating RN: Curtis Sites Primary Care Physician: Evelene Croon Other Clinician: Referring Physician: Evelene Croon Treating Physician/Extender: Rudene Re in Treatment: 18 Diagnosis Coding ICD-10 Codes Code Description [phone] Non-pressure chronic ulcer of left calf with fat layer exposed M61.462 Other calcification of muscle, left lower leg I73.9 Peripheral vascular disease, unspecified I89.0 Lymphedema, not elsewhere classified Facility Procedures CPT4 Code: [id number] Description: [phone] - WOUND CARE VISIT-LEV 2 EST PT Modifier: Quantity: 1 Physician Procedures CPT4 Code:  [id number] Description: 99213 - WC PHYS LEVEL 3 - EST PT ICD-10 Description Diagnosis L97.222 Non-pressure chronic ulcer of left calf with fat M61.462 Other calcification of muscle, left lower leg I73.9 Peripheral vascular disease, unspecified I89.0 Lymphedema, not  elsewhere classified Modifier: layer exposed Quantity: 1 Electronic Signature(s) Signed: 06/22/2016 4:51:08 PM By: Curtis Sites Signed: 06/23/2016 4:17:50 PM By: Evlyn Kanner MD, FACS Previous Signature: 06/22/2016 10:37:32 AM Version By: Evlyn Kanner MD, FACS Entered By: Curtis Sites on 06/22/2016 16:51:08

## 2017-11-16 IMAGING — US US EXTREM LOW VENOUS*L*
1 series · 13 of 24 positions shown · non-contrast
Comparison: None.

CLINICAL DATA: Left lower extremity edema

EXAM:
LEFT LOWER EXTREMITY VENOUS DUPLEX ULTRASOUND
TECHNIQUE: Gray-scale sonography with graded compression, as well as color
Doppler and duplex ultrasound were performed to evaluate the left
lower extremity deep venous system from the level of the common
femoral vein and including the common femoral, femoral, profunda
femoral, popliteal and calf veins including the posterior tibial,
peroneal and gastrocnemius veins when visible. The superficial great
saphenous vein was also interrogated. Spectral Doppler was utilized
to evaluate flow at rest and with distal augmentation maneuvers in
the common femoral, femoral and popliteal veins.

[Series 1: us extrem low venous*left* · 0.08mm/px · 13 of 39 slices shown]
[im 1/39]
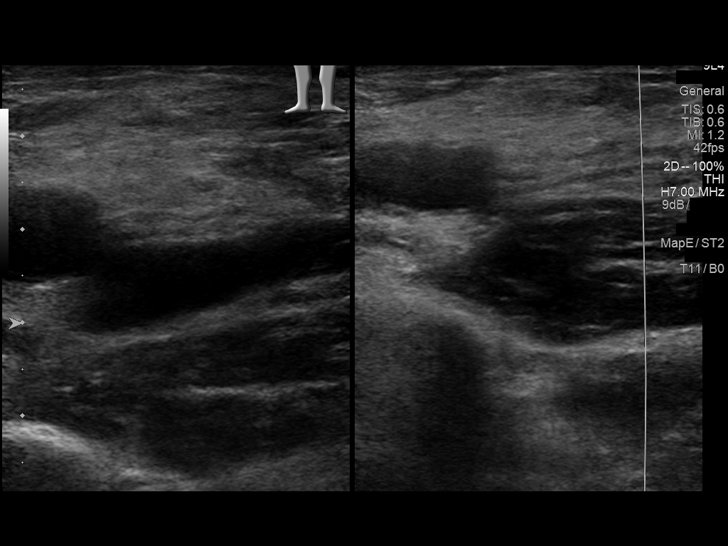
[im 4/39]
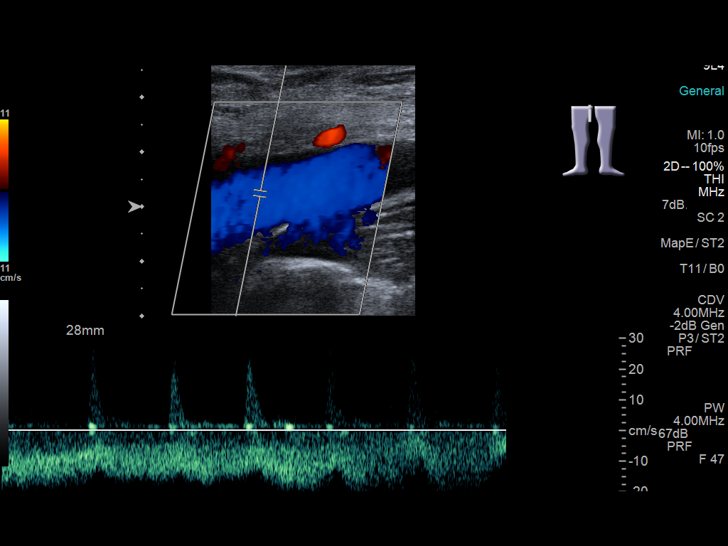
[im 7/39]
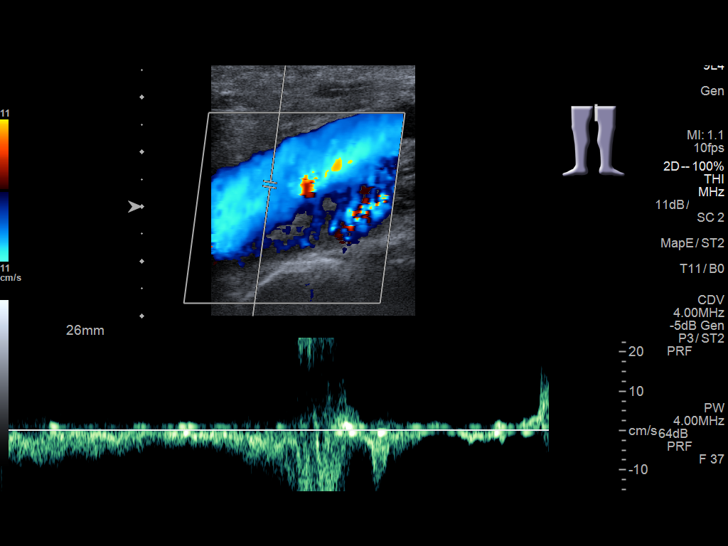
[im 10/39]
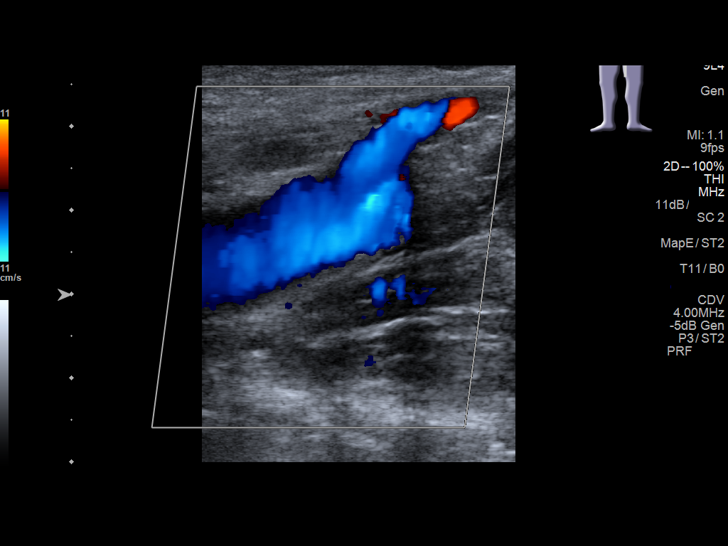
[im 14/39]
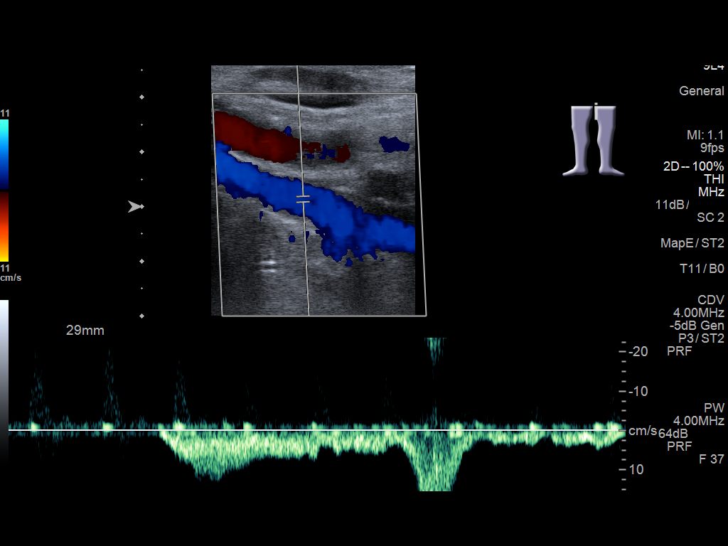
[im 17/39]
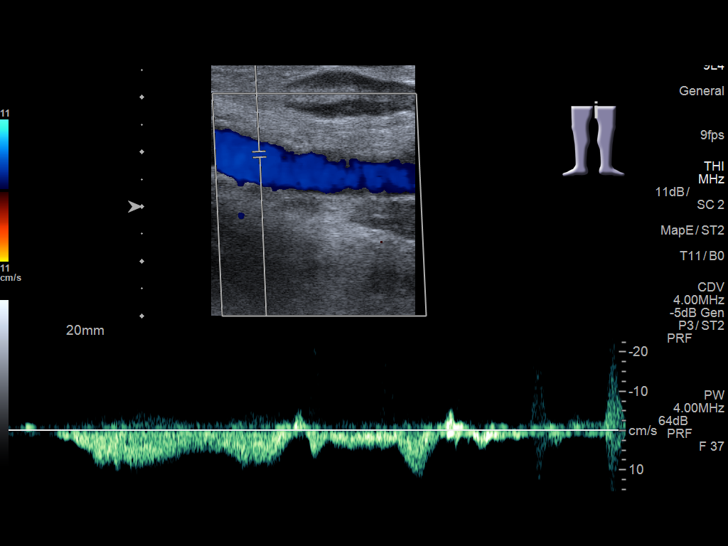
[im 20/39]
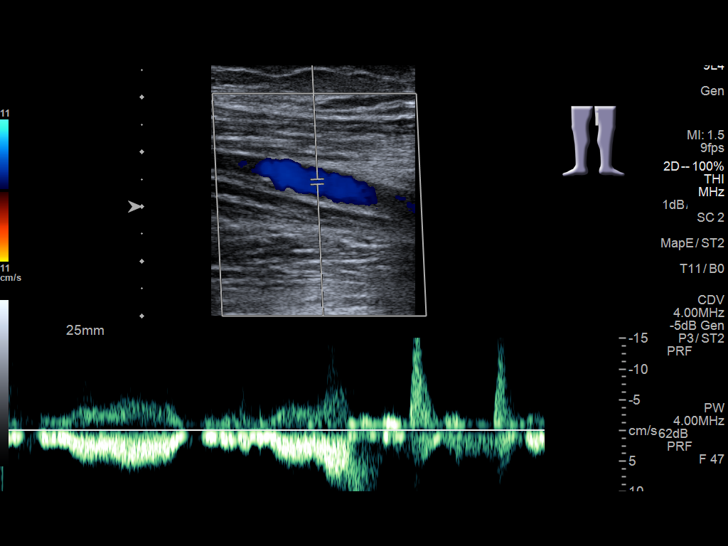
[im 22/39]
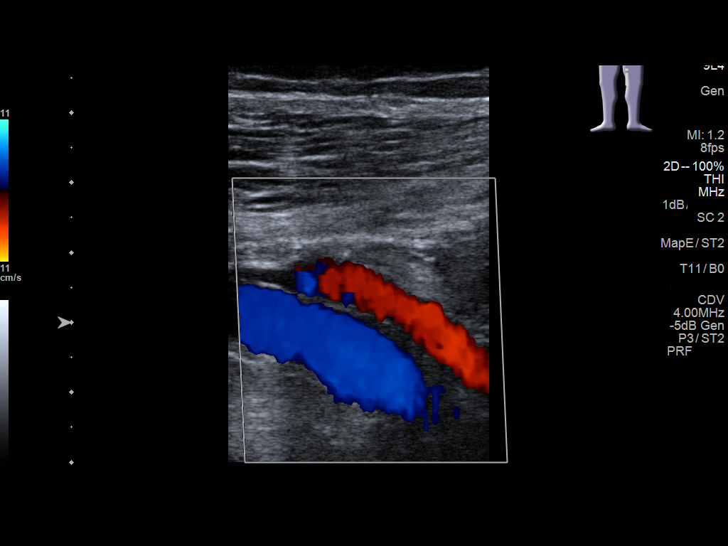
[im 25/39]
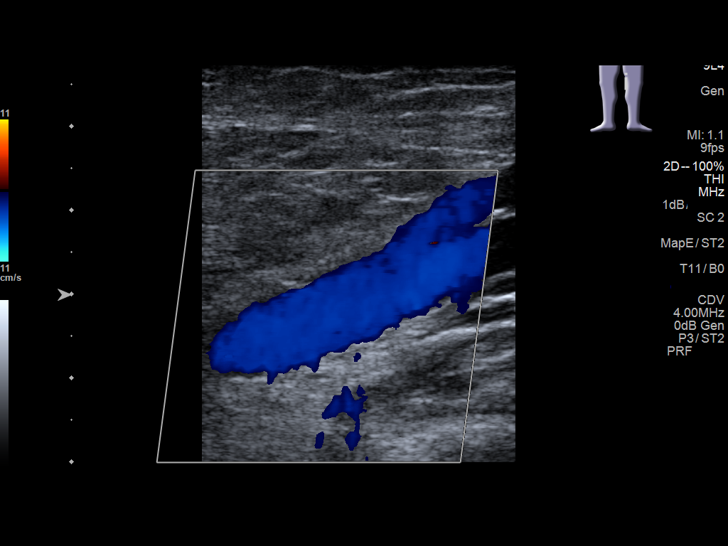
[im 29/39]
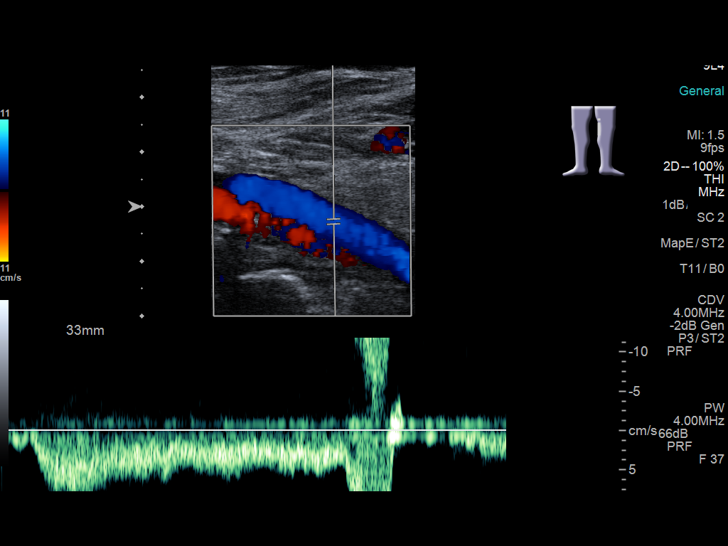
[im 32/39]
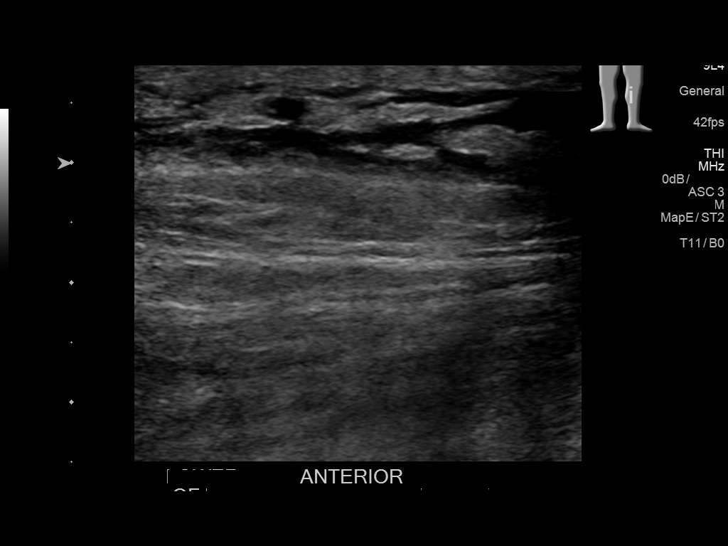
[im 35/39]
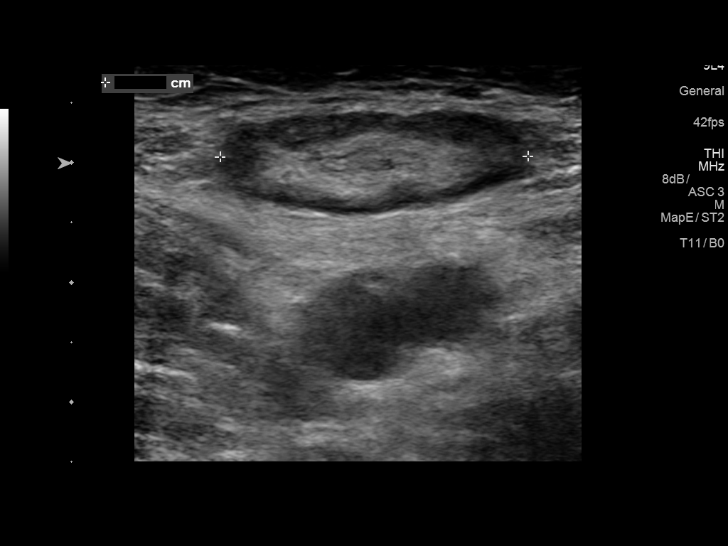
[im 39/39]
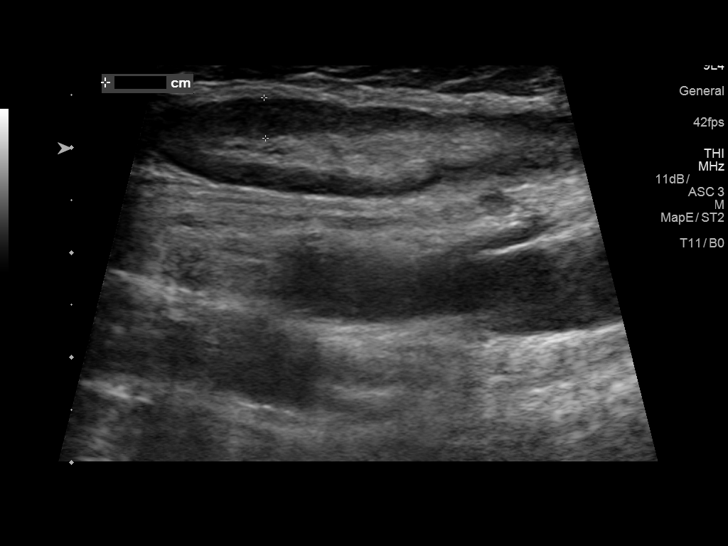

[13 of 24 positions shown; findings below may reference images not displayed]

FINDINGS: Contralateral Common Femoral Vein: Respiratory phasicity is normal
and symmetric with the symptomatic side. No evidence of thrombus.
Normal compressibility.

Common Femoral Vein: No evidence of thrombus. Normal
compressibility, respiratory phasicity and response to augmentation.

Saphenofemoral Junction: No evidence of thrombus. Normal
compressibility and flow on color Doppler imaging.

Profunda Femoral Vein: No evidence of thrombus. Normal
compressibility and flow on color Doppler imaging.

Femoral Vein: No evidence of thrombus. Normal compressibility,
respiratory phasicity and response to augmentation.

Popliteal Vein: No evidence of thrombus. Normal compressibility,
respiratory phasicity and response to augmentation.

Calf Veins: No evidence of thrombus. Normal compressibility and flow
on color Doppler imaging.

Superficial Great Saphenous Vein: No evidence of thrombus. Normal
compressibility and flow on color Doppler imaging.

Venous Reflux:  None.

Other Findings: There is lower extremity edema on the left. There is
a prominent inguinal lymph node measuring 4.3 x 1.0 x 2.6 cm. This
lymph node has a central fatty hilum.
IMPRESSION: No evidence of left lower extremity deep venous thrombosis. Right
common femoral vein is also patent. There is left lower extremity
soft tissue edema. There is a prominent benign-appearing left
inguinal lymph node which may well be of reactive etiology.

## 2017-12-02 IMAGING — DX DG TIBIA/FIBULA 2V*L*
4 series · 4 of 4 positions shown · non-contrast
Comparison: None.

CLINICAL DATA: Left lower leg pain following fall 1 week ago.
Initial encounter. History of previous injury and surgery to the
left over but.

EXAM:
LEFT TIBIA AND FIBULA - 2 VIEW

[tibia ap (1 of 2)]
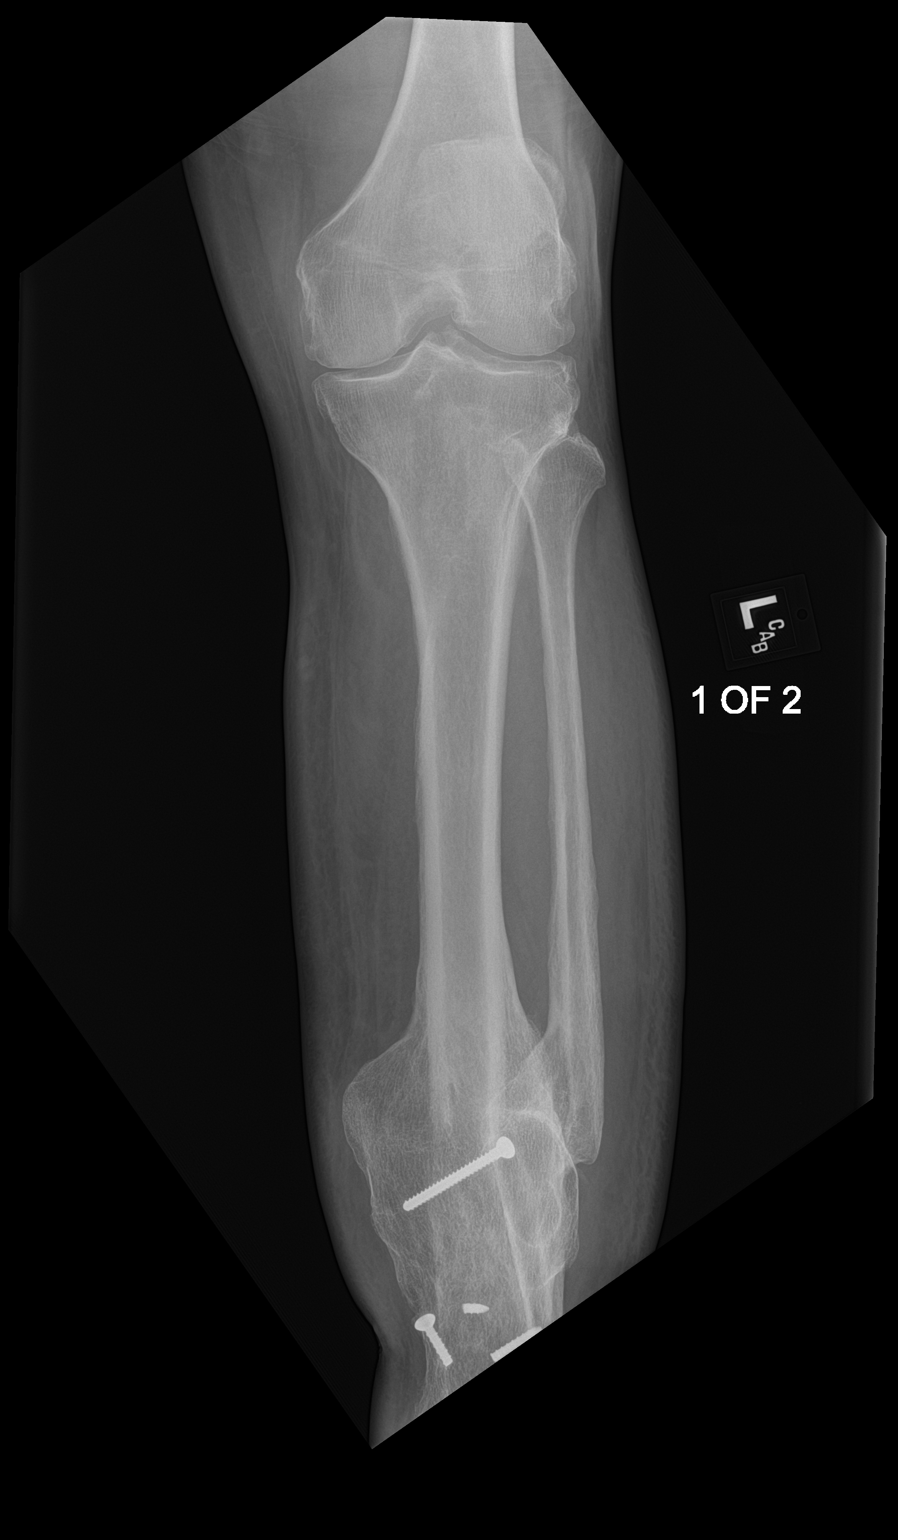

[tibia ap (2 of 2)]
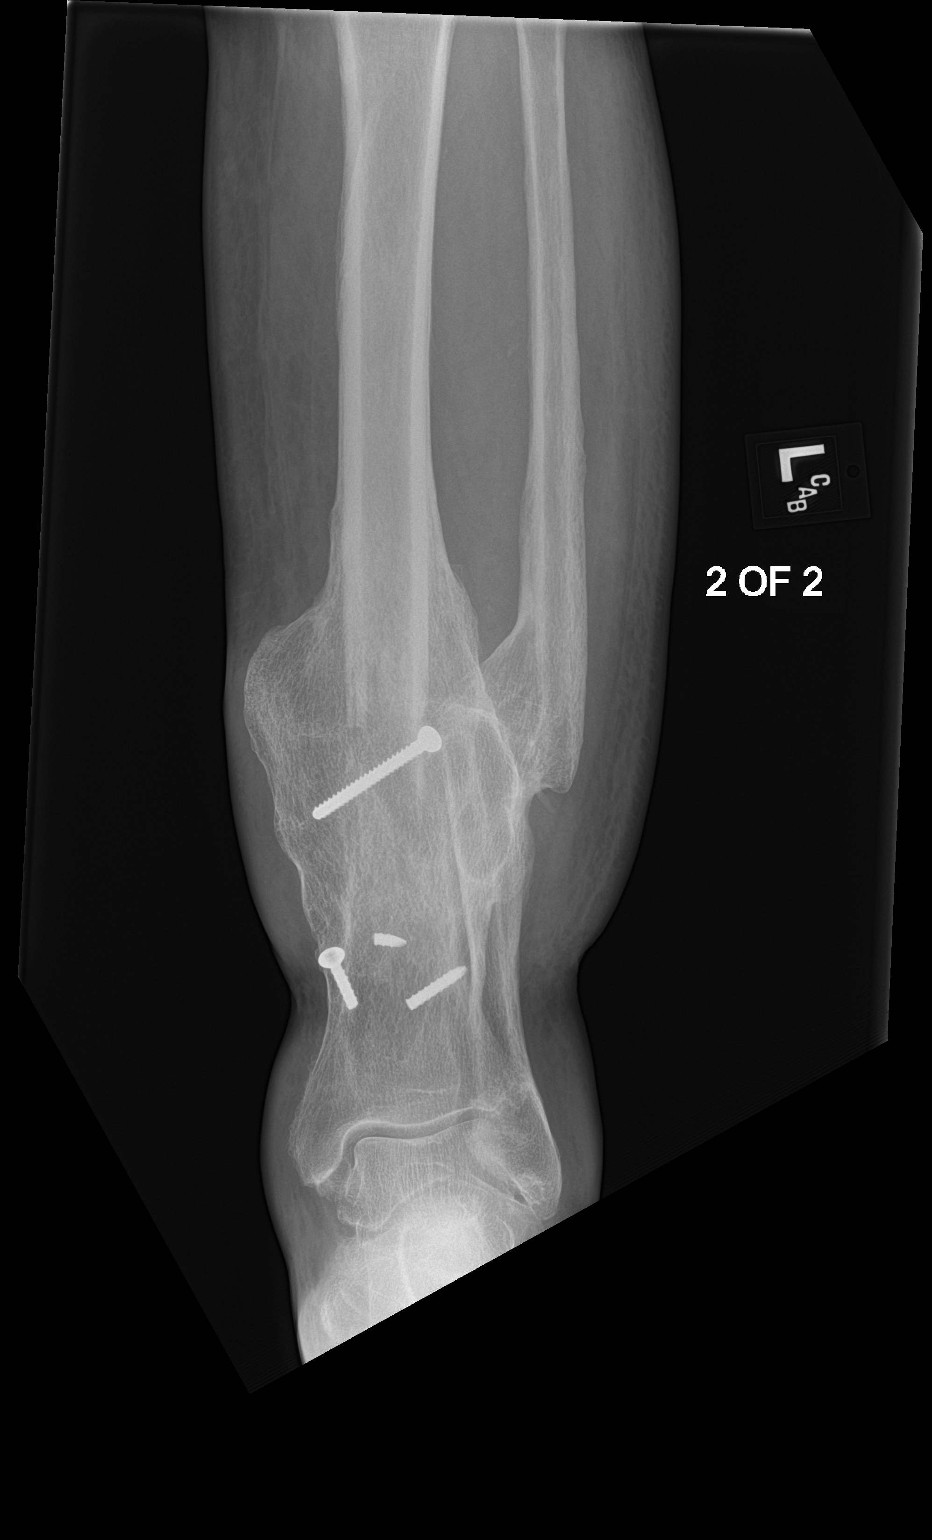

[tibia lat (1 of 2)]
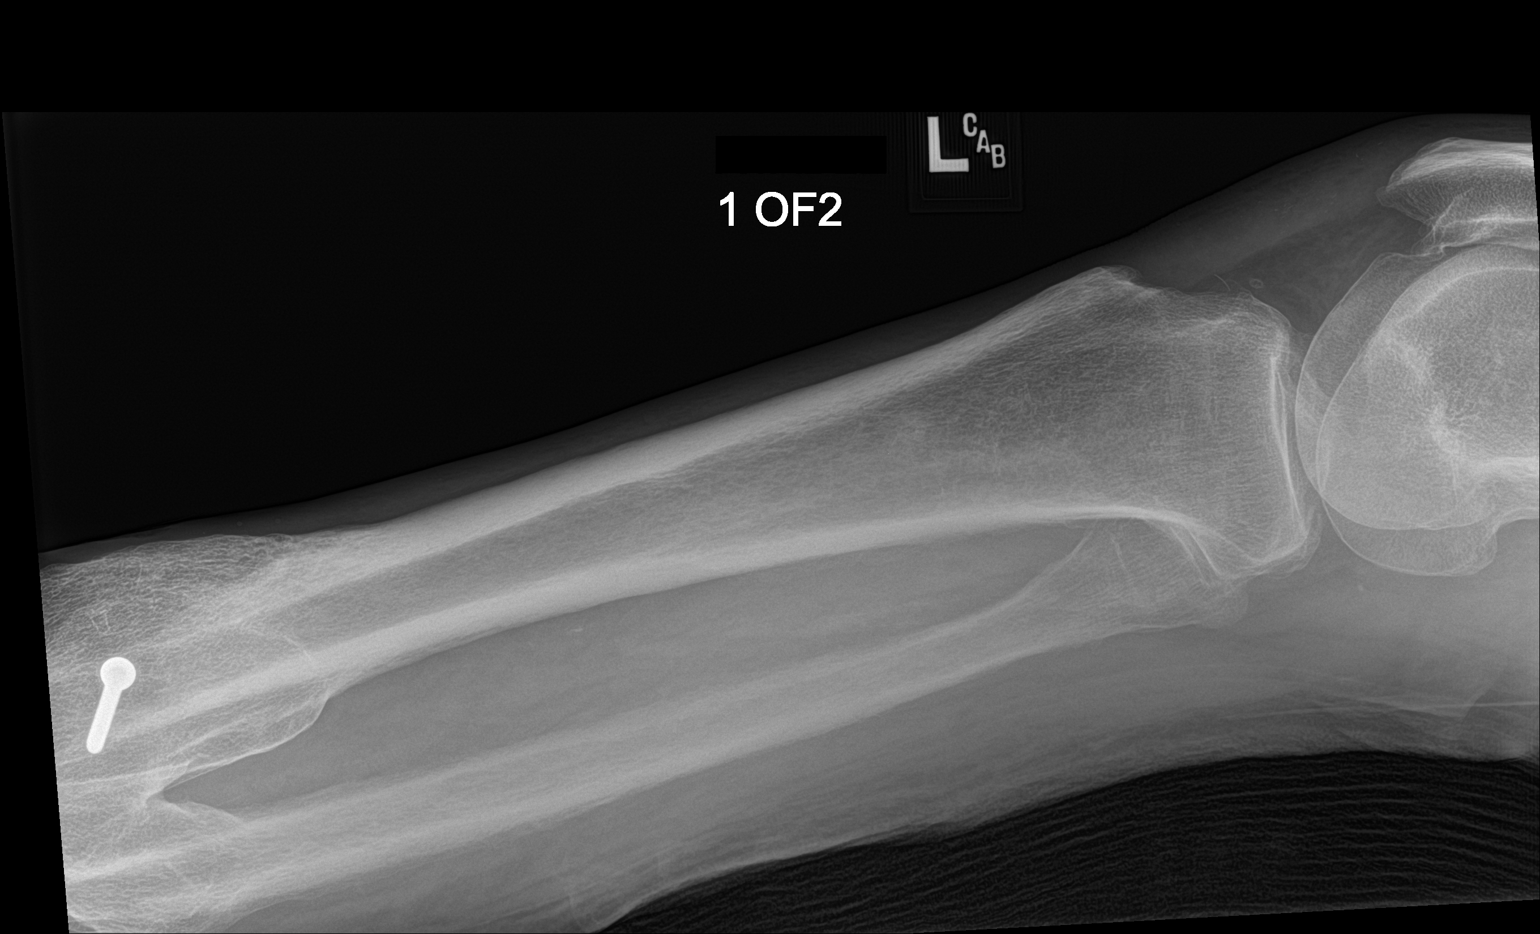

[tibia lat (2 of 2)]
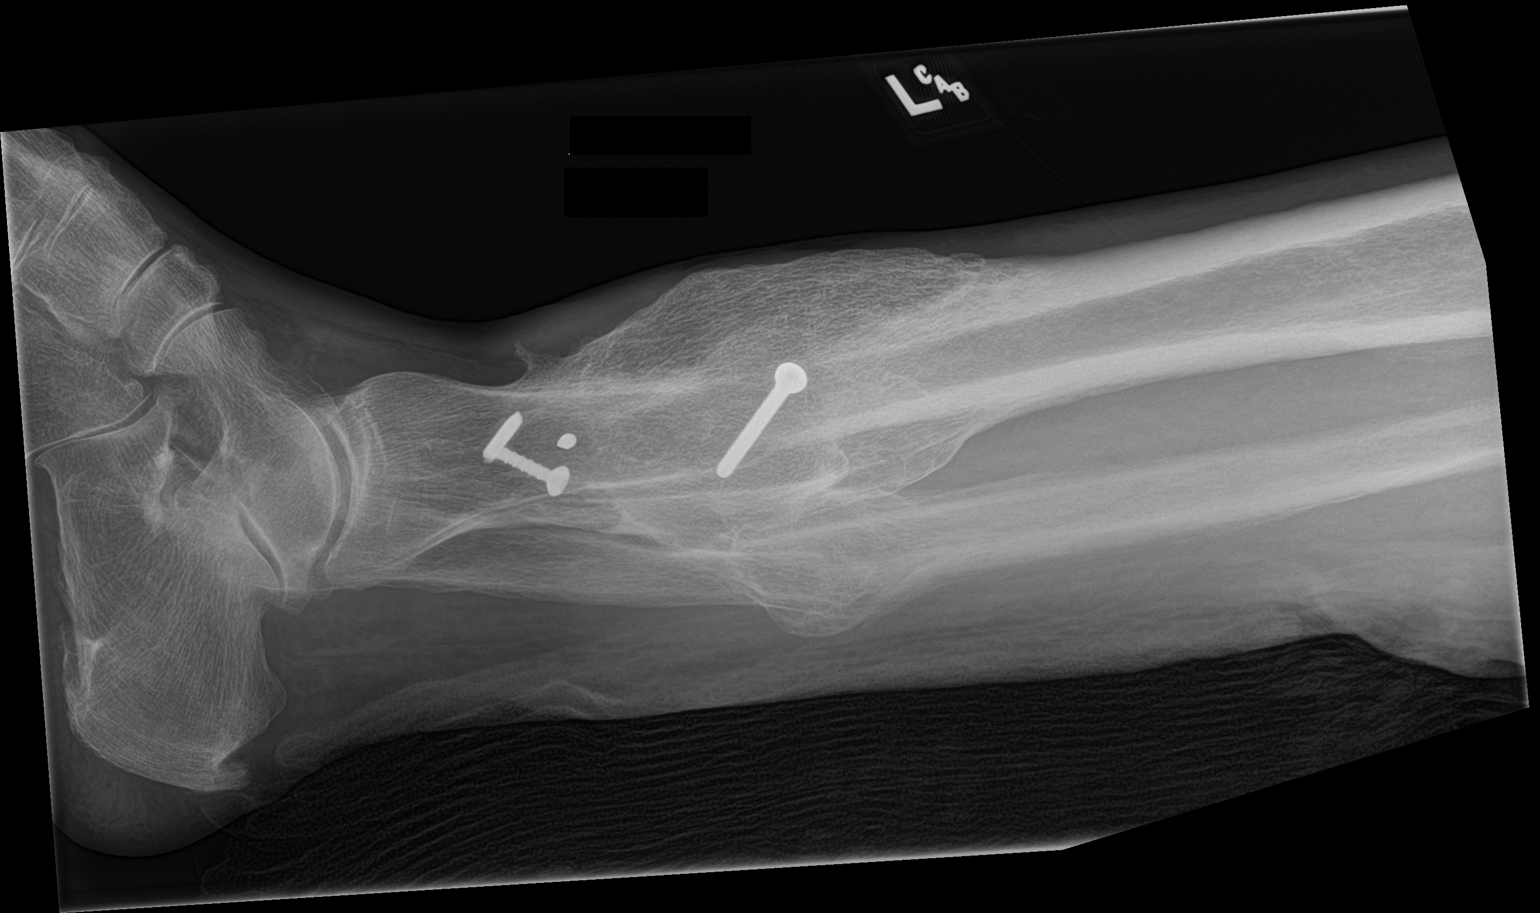

[4 of 4 positions shown; findings below may reference images not displayed]

FINDINGS: Remote healed fracture with extensive callus formation of the lower
tibia and fibula identified. Surgical hardware within the distal
tibia noted.

No acute fracture, subluxation or dislocation noted.

Diffuse soft tissue swelling is present.

Mild degenerative changes in the knee are present.
IMPRESSION: Soft tissue swelling without acute bony abnormality.

Remote injuries to the distal tibia and fibula.

## 2019-04-08 DEATH — deceased
